# Patient Record
Sex: Female | Born: 1980 | Race: Black or African American | Hispanic: No | Marital: Single | Smoking: Former smoker
Health system: Southern US, Community
[De-identification: ages and names within clinical notes are randomized; demographics above are authoritative.]

## PROBLEM LIST (undated history)

## (undated) ENCOUNTER — Ambulatory Visit

## (undated) ENCOUNTER — Inpatient Hospital Stay (HOSPITAL_COMMUNITY): Payer: Medicaid Other

## (undated) ENCOUNTER — Inpatient Hospital Stay (HOSPITAL_COMMUNITY): Payer: Self-pay

## (undated) DIAGNOSIS — F32A Depression, unspecified: Secondary | ICD-10-CM

## (undated) DIAGNOSIS — K219 Gastro-esophageal reflux disease without esophagitis: Secondary | ICD-10-CM

## (undated) DIAGNOSIS — Z8711 Personal history of peptic ulcer disease: Secondary | ICD-10-CM

## (undated) DIAGNOSIS — F329 Major depressive disorder, single episode, unspecified: Secondary | ICD-10-CM

## (undated) DIAGNOSIS — J302 Other seasonal allergic rhinitis: Secondary | ICD-10-CM

## (undated) DIAGNOSIS — I10 Essential (primary) hypertension: Secondary | ICD-10-CM

## (undated) DIAGNOSIS — T7840XA Allergy, unspecified, initial encounter: Secondary | ICD-10-CM

## (undated) DIAGNOSIS — G43909 Migraine, unspecified, not intractable, without status migrainosus: Secondary | ICD-10-CM

## (undated) DIAGNOSIS — E669 Obesity, unspecified: Secondary | ICD-10-CM

## (undated) DIAGNOSIS — Z8719 Personal history of other diseases of the digestive system: Secondary | ICD-10-CM

## (undated) DIAGNOSIS — F419 Anxiety disorder, unspecified: Secondary | ICD-10-CM

## (undated) HISTORY — DX: Major depressive disorder, single episode, unspecified: F32.9

## (undated) HISTORY — DX: Essential (primary) hypertension: I10

## (undated) HISTORY — DX: Migraine, unspecified, not intractable, without status migrainosus: G43.909

## (undated) HISTORY — DX: Obesity, unspecified: E66.9

## (undated) HISTORY — DX: Other seasonal allergic rhinitis: J30.2

## (undated) HISTORY — DX: Allergy, unspecified, initial encounter: T78.40XA

## (undated) HISTORY — DX: Depression, unspecified: F32.A

## (undated) HISTORY — DX: Gastro-esophageal reflux disease without esophagitis: K21.9

## (undated) HISTORY — PX: UPPER GASTROINTESTINAL ENDOSCOPY: SHX188

## (undated) HISTORY — DX: Personal history of peptic ulcer disease: Z87.11

## (undated) HISTORY — PX: COLONOSCOPY: SHX174

---
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
## 2013-12-12 ENCOUNTER — Encounter: Payer: Self-pay | Admitting: Obstetrics & Gynecology

## 2013-12-12 ENCOUNTER — Ambulatory Visit: Payer: BC Managed Care – PPO | Admitting: Obstetrics & Gynecology

## 2013-12-12 ENCOUNTER — Ambulatory Visit (INDEPENDENT_AMBULATORY_CARE_PROVIDER_SITE_OTHER): Payer: BC Managed Care – PPO | Admitting: Obstetrics & Gynecology

## 2013-12-12 VITALS — BP 129/84 | HR 88 | Temp 97.8°F | Ht 66.0 in | Wt 236.0 lb

## 2013-12-12 DIAGNOSIS — Z130 Encounter for screening for diseases of the blood and blood-forming organs and certain disorders involving the immune mechanism: Secondary | ICD-10-CM

## 2013-12-12 DIAGNOSIS — Z01419 Encounter for gynecological examination (general) (routine) without abnormal findings: Secondary | ICD-10-CM

## 2013-12-12 DIAGNOSIS — Z113 Encounter for screening for infections with a predominantly sexual mode of transmission: Secondary | ICD-10-CM

## 2013-12-12 DIAGNOSIS — Z124 Encounter for screening for malignant neoplasm of cervix: Secondary | ICD-10-CM

## 2013-12-12 LAB — POCT URINALYSIS DIPSTICK
BILIRUBIN UA: NEGATIVE
GLUCOSE UA: NEGATIVE
Ketones, UA: NEGATIVE
LEUKOCYTES UA: NEGATIVE
NITRITE UA: NEGATIVE
Protein, UA: NEGATIVE
Spec Grav, UA: 1.02
Urobilinogen, UA: NEGATIVE
pH, UA: 5

## 2013-12-12 LAB — CBC
HCT: 36.8 % (ref 36.0–46.0)
Hemoglobin: 11.9 g/dL — ABNORMAL LOW (ref 12.0–15.0)
MCH: 26.6 pg (ref 26.0–34.0)
MCHC: 32.3 g/dL (ref 30.0–36.0)
MCV: 82.3 fL (ref 78.0–100.0)
Platelets: 414 10*3/uL — ABNORMAL HIGH (ref 150–400)
RBC: 4.47 MIL/uL (ref 3.87–5.11)
RDW: 13.9 % (ref 11.5–15.5)
WBC: 9.2 10*3/uL (ref 4.0–10.5)

## 2013-12-12 NOTE — Progress Notes (Signed)
Subjective:     Sheila Davis is a 33 y.o. female here for a routine exam.  Current complaints: request t be evaluated for anemia.  Personal health questionnaire reviewed: no.   Gynecologic History Patient's last menstrual period was 11/18/2013. Contraception: OCP (estrogen/progesterone) Last Pap: 08/2012. Results were: normal Last mammogram: N/A  Obstetric History OB History  Gravida Para Term Preterm AB SAB TAB Ectopic Multiple Living  2 2 2            # Outcome Date GA Lbr Len/2nd Weight Sex Delivery Anes PTL Lv  2 TRM 10/02/05 7111w0d  7 lb (3.175 kg)  CCS     1 TRM 09/01/01 4914w0d 24:00 8 lb 12 oz (3.969 kg) M CCS          The following portions of the patient's history were reviewed and updated as appropriate: allergies, current medications, past family history, past medical history, past social history, past surgical history and problem list.  Review of Systems Pertinent items are noted in HPI.   Objective:   BP 129/84  Pulse 88  Temp(Src) 97.8 F (36.6 C) (Oral)  Ht 5\' 6"  (1.676 m)  Wt 236 lb (107.049 kg)  BMI 38.11 kg/m2  LMP 11/18/2013    General appearance: alert Breasts: normal appearance, no masses or tenderness Abdomen: soft, non-tender; bowel sounds normal; no masses,  no organomegaly Pelvic: cervix normal in appearance, external genitalia normal, no adnexal masses or tenderness, uterus normal size, shape, and consistency and vagina normal without discharge    Assessment:   Healthy female exam.   Plan:   Return prn

## 2013-12-13 ENCOUNTER — Encounter: Payer: Self-pay | Admitting: Obstetrics & Gynecology

## 2013-12-13 ENCOUNTER — Telehealth: Payer: Self-pay | Admitting: Genetic Counselor

## 2013-12-13 LAB — PAP IG, CT-NG NAA, HPV HIGH-RISK
Chlamydia Probe Amp: NEGATIVE
GC Probe Amp: NEGATIVE
HPV DNA HIGH RISK: NOT DETECTED

## 2013-12-13 LAB — RPR

## 2013-12-13 LAB — HIV ANTIBODY (ROUTINE TESTING W REFLEX): HIV: NONREACTIVE

## 2013-12-13 NOTE — Patient Instructions (Signed)

## 2013-12-13 NOTE — Telephone Encounter (Signed)
LEFT MESSAGE FOR PATIENT TO RETURN CALL TO SCHEDULE GENETIC APPT.  °

## 2013-12-23 ENCOUNTER — Ambulatory Visit (INDEPENDENT_AMBULATORY_CARE_PROVIDER_SITE_OTHER): Payer: BC Managed Care – PPO | Admitting: Physician Assistant

## 2013-12-23 VITALS — BP 122/74 | HR 94 | Temp 98.2°F | Resp 17 | Ht 66.0 in | Wt 234.0 lb

## 2013-12-23 DIAGNOSIS — N898 Other specified noninflammatory disorders of vagina: Secondary | ICD-10-CM

## 2013-12-23 DIAGNOSIS — R300 Dysuria: Secondary | ICD-10-CM

## 2013-12-23 DIAGNOSIS — B373 Candidiasis of vulva and vagina: Secondary | ICD-10-CM

## 2013-12-23 DIAGNOSIS — B3731 Acute candidiasis of vulva and vagina: Secondary | ICD-10-CM

## 2013-12-23 LAB — POCT UA - MICROSCOPIC ONLY
Casts, Ur, LPF, POC: NEGATIVE
Crystals, Ur, HPF, POC: NEGATIVE

## 2013-12-23 LAB — POCT WET PREP WITH KOH
KOH Prep POC: POSITIVE
Trichomonas, UA: NEGATIVE
Yeast Wet Prep HPF POC: POSITIVE

## 2013-12-23 LAB — POCT URINALYSIS DIPSTICK
Bilirubin, UA: NEGATIVE
Glucose, UA: NEGATIVE
Ketones, UA: NEGATIVE
Nitrite, UA: POSITIVE
Protein, UA: 30
Spec Grav, UA: 1.025
Urobilinogen, UA: 0.2
pH, UA: 7

## 2013-12-23 MED ORDER — PHENAZOPYRIDINE HCL 200 MG PO TABS: 200.0000 mg | ORAL_TABLET | Freq: Three times a day (TID) | ORAL | Status: DC | PRN

## 2013-12-23 MED ORDER — FLUCONAZOLE 150 MG PO TABS: 150.0000 mg | ORAL_TABLET | Freq: Once | ORAL | Status: DC

## 2013-12-23 MED ORDER — CIPROFLOXACIN HCL 250 MG PO TABS: 250.0000 mg | ORAL_TABLET | Freq: Two times a day (BID) | ORAL | Status: DC

## 2013-12-23 MED ORDER — CLOTRIMAZOLE 2 % VA CREA: 1.0000 | TOPICAL_CREAM | Freq: Every day | VAGINAL | Status: DC

## 2013-12-23 NOTE — Progress Notes (Signed)
Subjective:    Patient ID: Sheila Davis, female    DOB: 04-07-1981, 33 y.o.   MRN: [id number]  HPI 33 year old female presents for evaluation of dysuria, vaginal discharge, and lower abdominal pain.  States symptoms have been progressively worsening x 3 days.  She had her annual exam on 3/9 that included pap and STD testing that was all normal.  Admits that this past week she did a "cleanse bath" that included lavender, essential oils, and baking soda - believes this has caused her to have her current sx's.  Admits to vaginal pruritis, irritation, and discharge. Used monistat 3 nights ago that did not help much.   Hx of ovarian cysts that are well controlled on OCP's.  Denies fever, chills, nausea, vomiting, urinary frequency, flank pain, or vaginal odor.   Patient is otherwise doing well with no other concerns today.    Review of Systems  Constitutional: Negative for fever and chills.  Gastrointestinal: Positive for abdominal pain. Negative for nausea and vomiting.  Genitourinary: Positive for dysuria and vaginal discharge. Negative for frequency, flank pain and vaginal bleeding.       Objective:   Physical Exam  Constitutional: She is oriented to person, place, and time. She appears well-developed and well-nourished.  HENT:  Head: Normocephalic and atraumatic.  Right Ear: External ear normal.  Left Ear: External ear normal.  Eyes: Conjunctivae are normal.  Neck: Normal range of motion.  Cardiovascular: Normal rate, regular rhythm and normal heart sounds.   Pulmonary/Chest: Effort normal and breath sounds normal.  Neurological: She is alert and oriented to person, place, and time.  Psychiatric: She has a normal mood and affect. Her behavior is normal. Judgment and thought content normal.      Results for orders placed in visit on 12/23/13  POCT URINALYSIS DIPSTICK      Result Value Ref Range   Color, UA ORANGE     Clarity, UA CLOUDY     Glucose, UA NEG     Bilirubin, UA  NEG     Ketones, UA NEG     Spec Grav, UA 1.025     Blood, UA TRACE-INTACT     pH, UA 7.0     Protein, UA 30     Urobilinogen, UA 0.2     Nitrite, UA POS     Leukocytes, UA small (1+)    POCT UA - MICROSCOPIC ONLY      Result Value Ref Range   WBC, Ur, HPF, POC 12-23     RBC, urine, microscopic 6-14     Bacteria, U Microscopic 1+     Mucus, UA MOD     Epithelial cells, urine per micros 1-5     Crystals, Ur, HPF, POC NEG     Casts, Ur, LPF, POC NEG     Yeast, UA BUDS    POCT WET PREP WITH KOH      Result Value Ref Range   Trichomonas, UA Negative     Clue Cells Wet Prep HPF POC 0-1     Epithelial Wet Prep HPF POC 6-21     Yeast Wet Prep HPF POC pos     Bacteria Wet Prep HPF POC 3+     RBC Wet Prep HPF POC 2-13     WBC Wet Prep HPF POC tntc     KOH Prep POC Positive          Assessment & Plan:  Dysuria - Plan: POCT urinalysis dipstick, POCT  UA - Microscopic Only, ciprofloxacin (CIPRO) 250 MG tablet, phenazopyridine (PYRIDIUM) 200 MG tablet  Leukorrhea, not specified as infective - Plan: POCT Wet Prep with KOH  Yeast vaginitis - Plan: fluconazole (DIFLUCAN) 150 MG tablet, clotrimazole (GYNE-LOTRIMIN 3) 2 % vaginal cream  Will treat for yeast vaginitis with diflucan 150 mg po x 1 dose today. Repeat in 7 days Gyne-lotrimin rx provided to use as directed for topical relief Suspect UTI is causing abdominal pain and dysuria. Will treat with Cipro 250 mg bid x 5 days. Urine culture sent Follow up if symptoms worsening or fail to improve.

## 2014-01-06 ENCOUNTER — Encounter (HOSPITAL_COMMUNITY): Payer: Self-pay | Admitting: Emergency Medicine

## 2014-01-06 ENCOUNTER — Emergency Department (HOSPITAL_COMMUNITY)
Admission: EM | Admit: 2014-01-06 | Discharge: 2014-01-06 | Disposition: A | Discharge status: Home / Self Care | Payer: BC Managed Care – PPO | Source: Home / Self Care | Attending: Emergency Medicine | Admitting: Emergency Medicine

## 2014-01-06 DIAGNOSIS — N760 Acute vaginitis: Secondary | ICD-10-CM

## 2014-01-06 LAB — POCT URINALYSIS DIP (DEVICE)
Bilirubin Urine: NEGATIVE
Glucose, UA: NEGATIVE mg/dL
Ketones, ur: NEGATIVE mg/dL
Nitrite: NEGATIVE
Protein, ur: 30 mg/dL — AB
Specific Gravity, Urine: 1.02 (ref 1.005–1.030)
UROBILINOGEN UA: 0.2 mg/dL (ref 0.0–1.0)
pH: 7.5 (ref 5.0–8.0)

## 2014-01-06 LAB — POCT PREGNANCY, URINE: Preg Test, Ur: NEGATIVE

## 2014-01-06 MED ORDER — FLUCONAZOLE 150 MG PO TABS: 150.0000 mg | ORAL_TABLET | Freq: Every day | ORAL | Status: DC

## 2014-01-06 NOTE — Discharge Instructions (Signed)
Ms. Sheila Davis,   I am sorry for your troubles. You may have a persistent yeast infection from the antibiotics that you finished last week. Please take the diflucan tablet tonight and repeat in 3 days if necessary. Also, there is not better prescription cream for this issue.   Follow up with your OB-GYN if this is not improved.   Take Care,   Dr. Clinton SawyerWilliamson

## 2014-01-06 NOTE — ED Provider Notes (Signed)
CSN: [id number]     Arrival date & time 01/06/14  1937 History   First MD Initiated Contact with Patient 01/06/14 2003     Chief Complaint  Patient presents with  . Vaginal Itching   (Consider location/radiation/quality/duration/timing/severity/associated sxs/prior Treatment) HPI  33 year old F presenting with complaints of vaginal itching. This has been persistent for about 2 weeks. She was seen a few weeks ago by her OB/GYN who tested her for STDs. These tests were reported to be negative. [facility] At that time she was determined to have clinical symptoms of a urinary tract infection. Therefore she was treated with ciprofloxacin for 5 days. On the last day she took a Diflucan tablet to help with vaginal itching. However, she does have persistent itching since that time. She has tried lotrimin cream for 7 days. She denies any vaginal discharge. She denies any pelvic pain, fever, chills, nausea or vomiting. She denies any vaginal bleeding.   Past Medical History  Diagnosis Date  . Depression     pcp  . Seasonal allergies    Past Surgical History  Procedure Laterality Date  . Cesarean section       2002 & 2006   Family History  Problem Relation Age of Onset  . Cancer - Ovarian Mother 4163  . Diabetes Paternal Grandmother   . Heart disease Mother   . Hypertension Father    History  Substance Use Topics  . Smoking status: Never Smoker   . Smokeless tobacco: Never Used  . Alcohol Use: Yes   OB History   Grav Para Term Preterm Abortions TAB SAB Ect Mult Living   3 2 2  1  1         Review of Systems Negative for vaginal discharge, vaginal bleeding, abdominal pain, flank pain, fever, chills Allergies  Review of patient's allergies indicates no known allergies.  Home Medications   Current Outpatient Rx  Name  Route  Sig  Dispense  Refill  . ciprofloxacin (CIPRO) 250 MG tablet   Oral   Take 1 tablet (250 mg total) by mouth 2  (two) times daily.   10 tablet   0   . clotrimazole (GYNE-LOTRIMIN 3) 2 % vaginal cream   Vaginal   Place 1 Applicatorful vaginally at bedtime.   21 g   0   . fluconazole (DIFLUCAN) 150 MG tablet   Oral   Take 1 tablet (150 mg total) by mouth once. Repeat if needed   2 tablet   0   . Norgestim-Eth Estrad Triphasic (TRI-SPRINTEC PO)   Oral   Take 1 tablet by mouth daily.         . fluconazole (DIFLUCAN) 150 MG tablet   Oral   Take 1 tablet (150 mg total) by mouth daily. Repeat in 3 days as necessary.   2 tablet   0   . phenazopyridine (PYRIDIUM) 200 MG tablet   Oral   Take 1 tablet (200 mg total) by mouth 3 (three) times daily as needed for pain.   10 tablet   0    BP 120/82  Pulse 92  Temp(Src) 98.9 F (37.2 C) (Oral)  Resp 16  SpO2 97%  LMP 12/17/2013 Physical Exam Gen: obese female, non ill appearing, pleasant CV: RRR, no murmurs Pulm: CTA-B Abd: soft, NDNT GYN exam: deferred given no discharge  ED Course  Procedures (including critical care time) Labs Review Labs Reviewed  POCT URINALYSIS DIP (DEVICE) - Abnormal; Notable for the following:    Hgb urine dipstick TRACE (*)    Protein, ur 30 (*)    Leukocytes, UA LARGE (*)    All other components within normal limits  POCT PREGNANCY, URINE   Imaging Review No results found.   MDM   1. Vaginitis    Pt with persistent itching since taking antibiotic. Pt will need treatment with diflucan x 2.    Garnetta Buddy, MD 01/06/14 2106

## 2014-01-06 NOTE — ED Provider Notes (Signed)
Medical screening examination/treatment/procedure(s) were performed by a resident physician and as supervising physician I was immediately available for consultation/collaboration.  Leslee Homeavid Deondre Marinaro, M.D.  Reuben Likesavid C Paxton Binns, MD 01/06/14 2138

## 2014-01-06 NOTE — ED Notes (Signed)
Reports urine has an odor, has vaginal itching.  Patient reports recently completed course of cipro-completed on (3/29).  Then she thought to have yeast infection, tried otc remedies and one diflucan.

## 2014-01-09 ENCOUNTER — Ambulatory Visit (INDEPENDENT_AMBULATORY_CARE_PROVIDER_SITE_OTHER): Payer: BC Managed Care – PPO | Admitting: Emergency Medicine

## 2014-01-09 VITALS — BP 108/84 | HR 112 | Temp 98.8°F | Resp 18 | Ht 66.0 in | Wt 223.4 lb

## 2014-01-09 DIAGNOSIS — J018 Other acute sinusitis: Secondary | ICD-10-CM

## 2014-01-09 DIAGNOSIS — J209 Acute bronchitis, unspecified: Secondary | ICD-10-CM

## 2014-01-09 MED ORDER — FLUCONAZOLE 150 MG PO TABS: 150.0000 mg | ORAL_TABLET | Freq: Once | ORAL | Status: DC

## 2014-01-09 MED ORDER — PROMETHAZINE-CODEINE 6.25-10 MG/5ML PO SYRP: 5.0000 mL | ORAL_SOLUTION | Freq: Four times a day (QID) | ORAL | Status: DC | PRN

## 2014-01-09 MED ORDER — LEVOFLOXACIN 500 MG PO TABS
500.0000 mg | ORAL_TABLET | Freq: Every day | ORAL | Status: DC
Start: 2014-01-09 — End: 2014-01-19

## 2014-01-09 MED ORDER — PSEUDOEPHEDRINE-GUAIFENESIN ER 60-600 MG PO TB12: 1.0000 | ORAL_TABLET | Freq: Two times a day (BID) | ORAL | Status: DC

## 2014-01-09 NOTE — Patient Instructions (Signed)

## 2014-01-09 NOTE — Progress Notes (Signed)
[facility] [address], [place] [address] [phone]  Date:  01/09/2014   Name:  Sheila Davis   DOB:  June 19, 1981   MRN:  [id number]  PCP:  No primary provider on file.    Chief Complaint: Generalized Body Aches, Cough, Sinusitis, Otalgia and Chills   History of Present Illness:  Sheila Davis is a 33 y.o. very pleasant female patient who presents with the following:  Ill since Friday with nasal congestion and drainage and post nasal drainage.  Purulent.  Has a sore throat and pain and fullness in both ears.  Has a cough productive of purulent sputum.  No wheezing or shortness of breath.  Has experienced a fever with no chills.  No improvement with over the counter medications or other home remedies. Denies other complaint or health concern today.   There are no active problems to display for this patient.   Past Medical History  Diagnosis Date  . Depression     pcp  . Seasonal allergies     Past Surgical History  Procedure Laterality Date  . Cesarean section       2002 & 2006    History  Substance Use Topics  . Smoking status: Never Smoker   . Smokeless tobacco: Never Used  . Alcohol Use: Yes    Family History  Problem Relation Age of Onset  . Cancer - Ovarian Mother 6663  . Diabetes Paternal Grandmother   . Heart disease Mother   . Hypertension Father     No Known Allergies  Medication list has been reviewed and updated.  Current Outpatient Prescriptions on File Prior to Visit  Medication Sig Dispense Refill  . Norgestim-Eth Estrad Triphasic (TRI-SPRINTEC PO) Take 1 tablet by mouth daily.       No current facility-administered medications on file prior to visit.    Review of Systems:  As per HPI, otherwise negative.    Physical Examination: Filed Vitals:   01/09/14 1426  BP: 108/84  Pulse: 112  Temp: 98.8 F (37.1 C)  Resp: 18   Filed Vitals:   01/09/14 1426  Height: 5\' 6"  (1.676 m)  Weight: 223 lb 6.4 oz  (101.334 kg)   Body mass index is 36.08 kg/(m^2). Ideal Body Weight: Weight in (lb) to have BMI = 25: 154.6  GEN: WDWN, NAD, Non-toxic, A & O x 3 HEENT: Atraumatic, Normocephalic. Neck supple. No masses, No LAD. Ears and Nose: No external deformity. CV: RRR, No M/G/R. No JVD. No thrill. No extra heart sounds. PULM: CTA B, no wheezes, crackles, rhonchi. No retractions. No resp. distress. No accessory muscle use. ABD: S, NT, ND, +BS. No rebound. No HSM. EXTR: No c/c/e NEURO Normal gait.  PSYCH: Normally interactive. Conversant. Not depressed or anxious appearing.  Calm demeanor.    Assessment and Plan: Sinusitis Bronchitis levaquin mucinex d Phen c cod Diflucan   Signed,  Phillips OdorJeffery Tatianna Ibbotson, MD

## 2014-01-19 ENCOUNTER — Encounter: Payer: Self-pay | Admitting: Obstetrics & Gynecology

## 2014-01-19 ENCOUNTER — Ambulatory Visit (INDEPENDENT_AMBULATORY_CARE_PROVIDER_SITE_OTHER): Payer: BC Managed Care – PPO | Admitting: Obstetrics & Gynecology

## 2014-01-19 VITALS — BP 105/72 | HR 70 | Temp 98.6°F | Ht 66.0 in | Wt 236.0 lb

## 2014-01-19 DIAGNOSIS — N760 Acute vaginitis: Secondary | ICD-10-CM

## 2014-01-19 NOTE — Progress Notes (Signed)
Subjective:     Sheila Davis is a 33 y.o. female c/o a vaginal discharge. Patient states she is having itching, irritation, burning, and a thick/watery white vaginal discharge. Patient denies any vaginal odor. Patient states any time she tries to wash the irritation is horriable. Patient states she only takes showers, only use dove soap, only wears cotton underware and doesn't wear underware at night. Patient states she used Dial soap once about 2-3 weeks ago and noticed the irritation and vaginal discharge after that. Patient states she has tried  Gynelotrimin cream and monastat but neither made it any better. Patient states she took the Difulcan around the 6th or 7th and that kind of helped but that it came back. Patient states she is currently on her cycle and that it is heavy but not extremely heavy.  The HPI was reviewed and explored in further detail by the provider. Gynecologic History Patient's last menstrual period was 01/17/2014. Contraception: OCP (estrogen/progesterone) Last Pap: 12/12/2013. Results were: normal  Obstetric History OB History  Gravida Para Term Preterm AB SAB TAB Ectopic Multiple Living  3 2 2  1 1         # Outcome Date GA Lbr Len/2nd Weight Sex Delivery Anes PTL Lv  3 SAB 06/03/13 530w0d         2 TRM 10/02/05 21330w0d  7 lb (3.175 kg)  CCS     1 TRM 09/01/01 7418w0d 24:00 8 lb 12 oz (3.969 kg) M CCS         Past Medical History  Diagnosis Date  . Depression     pcp  . Seasonal allergies     Past Surgical History  Procedure Laterality Date  . Cesarean section       2002 & 2006    Current Outpatient Prescriptions  Medication Sig Dispense Refill  . Norgestim-Eth Estrad Triphasic (TRI-SPRINTEC PO) Take 1 tablet by mouth daily.       No current facility-administered medications for this visit.    No Known Allergies  History  Substance Use Topics  . Smoking status: Never Smoker   . Smokeless tobacco: Never Used  . Alcohol Use: Yes     Comment: occasionally     Family History  Problem Relation Age of Onset  . Cancer - Ovarian Mother 4863  . Diabetes Paternal Grandmother   . Heart disease Mother   . Hypertension Father       Review of Systems  Constitutional: negative for fatigue and weight loss Respiratory: negative for cough and wheezing Cardiovascular: negative for chest pain, fatigue and palpitations Gastrointestinal: negative for abdominal pain and change in bowel habits Musculoskeletal:negative for myalgias Neurological: negative for gait problems and tremors Behavioral/Psych: negative for abusive relationship, depression Endocrine: negative for temperature intolerance   Genitourinary:negative for abnormal menstrual periods, genital lesions, hot flashes, sexual problems; positive for  vaginal discharge Integument/breast: negative for breast lump, breast tenderness, nipple discharge and skin lesion(s)    Objective:       General:   alert  Skin:   no rash or abnormalities  Lungs:   clear to auscultation bilaterally  Heart:   regular rate and rhythm, S1, S2 normal, no murmur, click, rub or gallop  Breasts:   normal without suspicious masses, skin or nipple changes or axillary nodes  Abdomen:  normal findings: no organomegaly, soft, non-tender and no hernia  Pelvis:  External genitalia: normal general appearance Urinary system: urethral meatus normal and bladder without fullness, nontender Vaginal: normal without tenderness,  induration or masses; heme in the vault present Cervix: normal appearance Adnexa: normal bimanual exam Uterus: anteverted and non-tender, normal size   Lab Review  Labs reviewed no Radiologic studies reviewed no    Assessment:   Vulvovaginitis--?etiology; ddx--resistant candida, bacterial vaginosis, atopy  Plan:    Avoid irritants Orders Placed This Encounter  Procedures  . WET PREP BY MOLECULAR PROBE  Will prescribe further treatment based on wet prep resuts Follow up as needed.

## 2014-01-20 LAB — WET PREP BY MOLECULAR PROBE
Candida species: NEGATIVE
Gardnerella vaginalis: NEGATIVE
Trichomonas vaginosis: POSITIVE — AB

## 2014-01-22 ENCOUNTER — Encounter: Payer: Self-pay | Admitting: Obstetrics & Gynecology

## 2014-01-22 DIAGNOSIS — A5901 Trichomonal vulvovaginitis: Secondary | ICD-10-CM | POA: Insufficient documentation

## 2014-01-22 NOTE — Patient Instructions (Signed)
Vaginitis Vaginitis is an inflammation of the vagina. It is most often caused by a change in the normal balance of the bacteria and yeast that live in the vagina. This change in balance causes an overgrowth of certain bacteria or yeast, which causes the inflammation. There are different types of vaginitis, but the most common types are:  Bacterial vaginosis.  Yeast infection (candidiasis).  Trichomoniasis vaginitis. This is a sexually transmitted infection (STI).  Viral vaginitis.  Atropic vaginitis.  Allergic vaginitis. CAUSES  The cause depends on the type of vaginitis. Vaginitis can be caused by:  Bacteria (bacterial vaginosis).  Yeast (yeast infection).  A parasite (trichomoniasis vaginitis)  A virus (viral vaginitis).  Low hormone levels (atrophic vaginitis). Low hormone levels can occur during pregnancy, breastfeeding, or after menopause.  Irritants, such as bubble baths, scented tampons, and feminine sprays (allergic vaginitis). Other factors can change the normal balance of the yeast and bacteria that live in the vagina. These include:  Antibiotic medicines.  Poor hygiene.  Diaphragms, vaginal sponges, spermicides, birth control pills, and intrauterine devices (IUD).  Sexual intercourse.  Infection.  Uncontrolled diabetes.  A weakened immune system. SYMPTOMS  Symptoms can vary depending on the cause of the vaginitis. Common symptoms include:  Abnormal vaginal discharge.  The discharge is white, gray, or yellow with bacterial vaginosis.  The discharge is thick, white, and cheesy with a yeast infection.  The discharge is frothy and yellow or greenish with trichomoniasis.  A bad vaginal odor.  The odor is fishy with bacterial vaginosis.  Vaginal itching, pain, or swelling.  Painful intercourse.  Pain or burning when urinating. Sometimes, there are no symptoms. TREATMENT  Treatment will vary depending on the type of infection.   Bacterial  vaginosis and trichomoniasis are often treated with antibiotic creams or pills.  Yeast infections are often treated with antifungal medicines, such as vaginal creams or suppositories.  Viral vaginitis has no cure, but symptoms can be treated with medicines that relieve discomfort. Your sexual partner should be treated as well.  Atrophic vaginitis may be treated with an estrogen cream, pill, suppository, or vaginal ring. If vaginal dryness occurs, lubricants and moisturizing creams may help. You may be told to avoid scented soaps, sprays, or douches.  Allergic vaginitis treatment involves quitting the use of the product that is causing the problem. Vaginal creams can be used to treat the symptoms. HOME CARE INSTRUCTIONS   Take all medicines as directed by your caregiver.  Keep your genital area clean and dry. Avoid soap and only rinse the area with water.  Avoid douching. It can remove the healthy bacteria in the vagina.  Do not use tampons or have sexual intercourse until your vaginitis has been treated. Use sanitary pads while you have vaginitis.  Wipe from front to back. This avoids the spread of bacteria from the rectum to the vagina.  Let air reach your genital area.  Wear cotton underwear to decrease moisture buildup.  Avoid wearing underwear while you sleep until your vaginitis is gone.  Avoid tight pants and underwear or nylons without a cotton panel.  Take off wet clothing (especially bathing suits) as soon as possible.  Use mild, non-scented products. Avoid using irritants, such as:  Scented feminine sprays.  Fabric softeners.  Scented detergents.  Scented tampons.  Scented soaps or bubble baths.  Practice safe sex and use condoms. Condoms may prevent the spread of trichomoniasis and viral vaginitis. SEEK MEDICAL CARE IF:   You have abdominal pain.  You   have a fever or persistent symptoms for more than 2 3 days.  You have a fever and your symptoms suddenly  get worse. Document Released: 07/20/2007 Document Revised: 06/16/2012 Document Reviewed: 03/04/2012 [employer] Patient Information 2014 [employer].  

## 2014-01-23 ENCOUNTER — Other Ambulatory Visit: Payer: Self-pay | Admitting: *Deleted

## 2014-01-23 DIAGNOSIS — A599 Trichomoniasis, unspecified: Secondary | ICD-10-CM

## 2014-01-23 MED ORDER — METRONIDAZOLE 500 MG PO TABS: 2000.0000 mg | ORAL_TABLET | Freq: Once | ORAL | Status: DC

## 2014-03-23 ENCOUNTER — Ambulatory Visit: Payer: BC Managed Care – PPO | Admitting: Obstetrics & Gynecology

## 2014-08-07 ENCOUNTER — Encounter: Payer: Self-pay | Admitting: Obstetrics & Gynecology

## 2014-10-02 ENCOUNTER — Encounter: Payer: Self-pay | Admitting: *Deleted

## 2014-10-03 ENCOUNTER — Encounter: Payer: Self-pay | Admitting: Obstetrics & Gynecology

## 2014-11-10 ENCOUNTER — Ambulatory Visit (INDEPENDENT_AMBULATORY_CARE_PROVIDER_SITE_OTHER): Payer: No Typology Code available for payment source | Admitting: Family Medicine

## 2014-11-10 VITALS — BP 120/80 | HR 85 | Temp 98.5°F | Resp 16 | Ht 66.0 in | Wt 245.0 lb

## 2014-11-10 DIAGNOSIS — N912 Amenorrhea, unspecified: Secondary | ICD-10-CM

## 2014-11-10 DIAGNOSIS — N898 Other specified noninflammatory disorders of vagina: Secondary | ICD-10-CM

## 2014-11-10 DIAGNOSIS — R1030 Lower abdominal pain, unspecified: Secondary | ICD-10-CM

## 2014-11-10 LAB — POCT WET PREP WITH KOH
Bacteria Wet Prep HPF POC: NEGATIVE
Clue Cells Wet Prep HPF POC: NEGATIVE
KOH Prep POC: NEGATIVE
Trichomonas, UA: NEGATIVE
WBC Wet Prep HPF POC: NEGATIVE
Yeast Wet Prep HPF POC: NEGATIVE

## 2014-11-10 LAB — POCT URINALYSIS DIPSTICK
Bilirubin, UA: NEGATIVE
Glucose, UA: NEGATIVE
Ketones, UA: NEGATIVE
Leukocytes, UA: NEGATIVE
Nitrite, UA: NEGATIVE
Protein, UA: NEGATIVE
Spec Grav, UA: 1.01
Urobilinogen, UA: 0.2
pH, UA: 5.5

## 2014-11-10 LAB — POCT URINE PREGNANCY: Preg Test, Ur: NEGATIVE

## 2014-11-10 LAB — POCT UA - MICROSCOPIC ONLY
Bacteria, U Microscopic: NEGATIVE
Mucus, UA: NEGATIVE
WBC, Ur, HPF, POC: NEGATIVE

## 2014-11-10 MED ORDER — METRONIDAZOLE 250 MG PO TABS: 250.0000 mg | ORAL_TABLET | Freq: Three times a day (TID) | ORAL | Status: DC

## 2014-11-10 NOTE — Patient Instructions (Signed)

## 2014-11-10 NOTE — Progress Notes (Addendum)
Subjective:    Patient ID: Sheila Davis, female    DOB: Aug 02, 1981, 34 y.o.   MRN: [id number] This chart was scribed for Elvina SidleKurt Lauenstein, MD by Littie Deedsichard Sun, Medical Scribe. This patient was seen in Room 10 and the patient's care was started at 4:09 PM.   HPI HPI Comments: Sheila Davis is a 34 y.o. female who [facility] complaining of gradual onset, pressurizing lower abdominal pain radiating to her back that started a few days ago. Patient reports having associated HA, nausea, and clear vaginal discharge with reddish tinge that she believes to be blood.  She denies fever, chest pain, cough and vomiting. Patient is on Tri-Sprintec; however, she has not refilled them on time. LNMP was 10/07/14; her menstrual cycle has run late.  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  Respiratory: Negative for cough.   Cardiovascular: Negative for chest pain.  Gastrointestinal: Positive for nausea and abdominal pain. Negative for vomiting.  Genitourinary: Positive for vaginal discharge.  Musculoskeletal: Positive for back pain.  Neurological: Positive for headaches.   Review of chart reveals patient has similar complaints every several months for which she is prescribed narcotics    Objective:   Physical Exam CONSTITUTIONAL: Obese African-American woman in no acute distress HEAD: Normocephalic/atraumatic EYES: EOM/PERRL ENMT: Mucous membranes moist NECK: supple no meningeal signs SPINE: entire spine nontender CV: S1/S2 noted, no murmurs/rubs/gallops noted LUNGS: Lungs are clear to auscultation bilaterally, no apparent distress ABDOMEN: soft, nontender, no rebound or guarding GU: normal ext genitalia, moderate white vag discharge, no pelvic pain or mass, no adenexal mass or tenderness. NEURO: Pt is awake/alert, moves all extremitiesx4,  EXTREMITIES: pulses  normal, full ROM SKIN: warm and dry PSYCH: no abnormalities of mood noted Results for orders placed or performed in visit on 11/10/14  POCT UA - Microscopic Only  Result Value Ref Range   WBC, Ur, HPF, POC Negative    RBC, urine, microscopic 0-3    Bacteria, U Microscopic Negative    Mucus, UA Negative    Epithelial cells, urine per micros 0-7    Crystals, Ur, HPF, POC     Casts, Ur, LPF, POC     Yeast, UA    POCT urinalysis dipstick  Result Value Ref Range   Color, UA Yellow    Clarity, UA Slightly Cloudy    Glucose, UA Negative    Bilirubin, UA Negative    Ketones, UA Negative    Spec Grav, UA 1.010    Blood, UA Trace-lysed    pH, UA 5.5    Protein, UA Negative    Urobilinogen, UA 0.2    Nitrite, UA Negative    Leukocytes, UA Negative   POCT Wet Prep with KOH  Result Value Ref Range   Trichomonas, UA Negative    Clue Cells Wet Prep HPF POC Negative    Epithelial Wet Prep HPF POC 2-5    Yeast Wet Prep HPF POC Negative    Bacteria Wet Prep HPF POC Negative    RBC Wet Prep HPF POC 0-4    WBC Wet Prep HPF POC Negative    KOH Prep POC Negative   POCT urine pregnancy  Result Value Ref Range   Preg Test, Ur Negative       Assessment & Plan:   This chart was scribed in my presence and reviewed by  me personally.    ICD-9-CM ICD-10-CM   1. Lower abdominal pain 789.09 R10.30 POCT UA - Microscopic Only     POCT urinalysis dipstick     POCT Wet Prep with KOH     Urine culture     POCT urine pregnancy     GC/chlamydia probe amp, urine     metroNIDAZOLE (FLAGYL) 250 MG tablet  2. Vaginal discharge 623.5 N89.8 POCT Wet Prep with KOH     GC/chlamydia probe amp, urine     metroNIDAZOLE (FLAGYL) 250 MG tablet  3. Amenorrhea 626.0 N91.2 POCT urine pregnancy     metroNIDAZOLE (FLAGYL) 250 MG tablet   Note for work written  Signed, Elvina Sidle, MD

## 2014-11-11 LAB — GC/CHLAMYDIA PROBE AMP, URINE
Chlamydia, Swab/Urine, PCR: NEGATIVE
GC Probe Amp, Urine: NEGATIVE

## 2014-11-12 LAB — URINE CULTURE: Colony Count: 50000

## 2014-12-03 ENCOUNTER — Ambulatory Visit (INDEPENDENT_AMBULATORY_CARE_PROVIDER_SITE_OTHER): Payer: No Typology Code available for payment source | Admitting: Family Medicine

## 2014-12-03 VITALS — BP 122/80 | HR 88 | Temp 98.3°F | Resp 18 | Ht 67.5 in | Wt 244.2 lb

## 2014-12-03 DIAGNOSIS — R350 Frequency of micturition: Secondary | ICD-10-CM

## 2014-12-03 LAB — POCT UA - MICROSCOPIC ONLY
BACTERIA, U MICROSCOPIC: NEGATIVE
CASTS, UR, LPF, POC: NEGATIVE
CRYSTALS, UR, HPF, POC: NEGATIVE
Yeast, UA: NEGATIVE

## 2014-12-03 LAB — POCT URINALYSIS DIPSTICK
Bilirubin, UA: NEGATIVE
Glucose, UA: NEGATIVE
KETONES UA: NEGATIVE
NITRITE UA: NEGATIVE
Spec Grav, UA: 1.02
Urobilinogen, UA: 0.2
pH, UA: 6.5

## 2014-12-03 MED ORDER — CIPROFLOXACIN HCL 500 MG PO TABS: 500.0000 mg | ORAL_TABLET | Freq: Two times a day (BID) | ORAL | Status: DC

## 2014-12-03 NOTE — Progress Notes (Signed)
Subjective:    Patient ID: Sheila Davis, female    DOB: 1981-03-14, 34 y.o.   MRN: [id number]  HPI Chief Complaint  Patient presents with   Urinary Frequency    x3 days; and getting worse   urinary pain    denies having any blood in urine   Nausea   This chart was scribed for Elvina SidleKurt Lauenstein, MD by Andrew Auaven Small, ED Scribe. This patient was seen in room 8 and the patient's care was started at 4:30 PM.  HPI Comments: Sheila Davis is a 34 y.o. female who presents to the [facility] complaining of UTI that began 1 week ago. Pt symptoms consist of urinary bladder pressure, frequency,back pain, HA, and nausea. She states she had symptoms 1 weeks ago that gradually worsened 2-3 days ago. Pt has tried drinking cranberry juice and water.Pt reports hx of UTI.   Pt denies hematuria and fever. Pt denies drug allergies. Pt is on birth control Pt is a Engineer, civil (consulting)nurse at [employer].   Past Medical History  Diagnosis Date   Depression     pcp   Seasonal allergies    Past Surgical History  Procedure Laterality Date   Cesarean section       2002 & 2006   Prior to Admission medications   Medication Sig Start Date End Date Taking? Authorizing Provider  Norgestimate-Ethinyl Estradiol Triphasic 0.18/0.215/0.25 MG-35 MCG tablet Take 1 tablet by mouth daily.   Yes Historical Provider, MD   Review of Systems  Constitutional: Negative for fever.  Gastrointestinal: Positive for nausea.  Genitourinary: Positive for frequency. Negative for dysuria and hematuria.  Musculoskeletal: Positive for back pain.  Neurological: Positive for headaches.   Objective:   Physical Exam  Constitutional: She is oriented to person, place, and time. She appears well-developed and well-nourished. No distress.  HENT:  Head: Normocephalic and atraumatic.  Eyes: Conjunctivae and EOM are normal.  Neck: Neck supple.  Cardiovascular: Normal rate.   Pulmonary/Chest: Effort normal.    Musculoskeletal: Normal range of motion.  Neurological: She is alert and oriented to person, place, and time.  Skin: Skin is warm and dry.  Psychiatric: She has a normal mood and affect. Her behavior is normal.  Nursing note and vitals reviewed.  Results for orders placed or performed in visit on 12/03/14  POCT urinalysis dipstick  Result Value Ref Range   Color, UA YELLOW    Clarity, UA CLEAR    Glucose, UA NEG    Bilirubin, UA NEG    Ketones, UA NEG    Spec Grav, UA 1.020    Blood, UA SMALL    pH, UA 6.5    Protein, UA TRACE    Urobilinogen, UA 0.2    Nitrite, UA NEG    Leukocytes, UA Trace    Assessment & Plan:   1. Urinary frequency    Meds ordered this encounter  Medications   Norgestimate-Ethinyl Estradiol Triphasic 0.18/0.215/0.25 MG-35 MCG tablet    Sig: Take 1 tablet by mouth daily.   ciprofloxacin (CIPRO) 500 MG tablet    Sig: Take 1 tablet (500 mg total) by mouth 2 (two) times daily.    Dispense:  10 tablet    Refill:  0   This chart was scribed in my presence and reviewed by me personally.    ICD-9-CM ICD-10-CM   1. Urinary frequency 788.41 R35.0 POCT UA - Microscopic Only     POCT urinalysis dipstick  Urine culture     ciprofloxacin (CIPRO) 500 MG tablet     Signed, Elvina Sidle, MD

## 2014-12-03 NOTE — Patient Instructions (Signed)

## 2014-12-05 LAB — URINE CULTURE
Colony Count: NO GROWTH
Organism ID, Bacteria: NO GROWTH

## 2014-12-15 ENCOUNTER — Encounter: Payer: Self-pay | Admitting: Certified Nurse Midwife

## 2014-12-15 ENCOUNTER — Ambulatory Visit (INDEPENDENT_AMBULATORY_CARE_PROVIDER_SITE_OTHER): Payer: No Typology Code available for payment source | Admitting: Certified Nurse Midwife

## 2014-12-15 VITALS — BP 126/81 | HR 91 | Ht 66.0 in | Wt 245.0 lb

## 2014-12-15 DIAGNOSIS — R5383 Other fatigue: Secondary | ICD-10-CM

## 2014-12-15 DIAGNOSIS — R399 Unspecified symptoms and signs involving the genitourinary system: Secondary | ICD-10-CM

## 2014-12-15 DIAGNOSIS — F329 Major depressive disorder, single episode, unspecified: Secondary | ICD-10-CM

## 2014-12-15 DIAGNOSIS — Z01419 Encounter for gynecological examination (general) (routine) without abnormal findings: Secondary | ICD-10-CM

## 2014-12-15 DIAGNOSIS — F32A Depression, unspecified: Secondary | ICD-10-CM

## 2014-12-15 DIAGNOSIS — E669 Obesity, unspecified: Secondary | ICD-10-CM

## 2014-12-15 LAB — CBC WITH DIFFERENTIAL/PLATELET
Basophils Absolute: 0 10*3/uL (ref 0.0–0.1)
Basophils Relative: 0 % (ref 0–1)
Eosinophils Absolute: 0.1 10*3/uL (ref 0.0–0.7)
Eosinophils Relative: 1 % (ref 0–5)
HEMATOCRIT: 38.9 % (ref 36.0–46.0)
HEMOGLOBIN: 12.4 g/dL (ref 12.0–15.0)
LYMPHS PCT: 22 % (ref 12–46)
Lymphs Abs: 2.3 10*3/uL (ref 0.7–4.0)
MCH: 26.7 pg (ref 26.0–34.0)
MCHC: 31.9 g/dL (ref 30.0–36.0)
MCV: 83.8 fL (ref 78.0–100.0)
MONOS PCT: 6 % (ref 3–12)
MPV: 9.7 fL (ref 8.6–12.4)
Monocytes Absolute: 0.6 10*3/uL (ref 0.1–1.0)
NEUTROS ABS: 7.3 10*3/uL (ref 1.7–7.7)
NEUTROS PCT: 71 % (ref 43–77)
Platelets: 429 10*3/uL — ABNORMAL HIGH (ref 150–400)
RBC: 4.64 MIL/uL (ref 3.87–5.11)
RDW: 13.4 % (ref 11.5–15.5)
WBC: 10.3 10*3/uL (ref 4.0–10.5)

## 2014-12-15 LAB — TSH: TSH: 0.622 u[IU]/mL (ref 0.350–4.500)

## 2014-12-15 NOTE — Progress Notes (Signed)
Patient ID: Sheila Davis, female   DOB: 1981/07/03, 34 y.o.   MRN: [id number]   Subjective:     Sheila Davis is a 34 y.o. female here for a routine exam.  Current complaints: depression/anxiety along with roughly a 75 lb weight gain in a year.  Rarely sexually active.  Desires to stay on current birth control.  Desires referral for PCP, Genetics, psychiatry for medication management, and nutrition.  Employed full time.  Single parent of two children.  Has a high risk family hx of lung CA & ovarian CA of mother around late 80's.    Personal health questionnaire:  Is patient [name], have a family history of breast and/or ovarian cancer: yes Is there a family history of uterine cancer diagnosed at age < 95, gastrointestinal cancer, urinary tract cancer, family member who is a Personnel officer syndrome-associated carrier: no Is the patient overweight and hypertensive, family history of diabetes, personal history of gestational diabetes, preeclampsia or PCOS: yes Is patient over 78, have PCOS,  family history of premature CHD under age 27, diabetes, smoke, have hypertension or peripheral artery disease:  no At any time, has a partner hit, kicked or otherwise hurt or frightened you?: no Over the past 2 weeks, have you felt down, depressed or hopeless?:  yes Over the past 2 weeks, have you felt little interest or pleasure in doing things?:yes   Gynecologic History Patient's last menstrual period was 11/22/2014. Contraception: OCP (estrogen/progesterone) Last Pap: 12/12/13. Results were: normal Last mammogram: none  Obstetric History OB History  Gravida Para Term Preterm AB SAB TAB Ectopic Multiple Living  # Outcome Date GA Lbr Len/2nd Weight Sex Delivery Anes PTL Lv  3 SAB 06/03/13 [redacted]w[redacted]d         2 Term 10/02/05 [redacted]w[redacted]d  3.175 kg (7 lb)  CS-Classical     1 Term 09/01/01 [redacted]w[redacted]d 24:00 3.969 kg (8 lb 12 oz) M CS-Classical         Past Medical History  Diagnosis Date  . Depression      pcp  . Seasonal allergies     Past Surgical History  Procedure Laterality Date  . Cesarean section       2002 & 2006     Current outpatient prescriptions:  .  citalopram (CELEXA) 40 MG tablet, Take 40 mg by mouth daily., Disp: , Rfl:  .  Norgestimate-Ethinyl Estradiol Triphasic 0.18/0.215/0.25 MG-35 MCG tablet, Take 1 tablet by mouth daily., Disp: , Rfl:  .  ciprofloxacin (CIPRO) 500 MG tablet, Take 1 tablet (500 mg total) by mouth 2 (two) times daily. (Patient not taking: Reported on 12/15/2014), Disp: 10 tablet, Rfl: 0 No Known Allergies  History  Substance Use Topics  . Smoking status: Never Smoker   . Smokeless tobacco: Never Used  . Alcohol Use: Yes     Comment: occasionally    Family History  Problem Relation Age of Onset  . Cancer - Ovarian Mother 68  . Diabetes Paternal Grandmother   . Heart disease Mother   . Hypertension Father       Review of Systems  Constitutional: negative for fatigue and weight loss Respiratory: negative for cough and wheezing Cardiovascular: negative for chest pain, fatigue and palpitations Gastrointestinal: negative for abdominal pain and change in bowel habits Musculoskeletal:negative for myalgias Neurological: negative for gait problems and tremors Behavioral/Psych: negative for abusive relationship, depression Endocrine: negative for temperature intolerance   Genitourinary:negative for  abnormal menstrual periods, genital lesions, hot flashes, sexual problems and vaginal discharge Integument/breast: negative for breast lump, breast tenderness, nipple discharge and skin lesion(s)    Objective:       BP 126/81 mmHg  Pulse 91  Ht 5\' 6"  (1.676 m)  Wt 111.131 kg (245 lb)  BMI 39.56 kg/m2  LMP 11/22/2014 General:   alert  Skin:   no rash or abnormalities  Lungs:   clear to auscultation bilaterally  Heart:   regular rate and rhythm, S1, S2 normal, no murmur, click, rub or gallop  Breasts:   normal without suspicious masses,  skin or nipple changes or axillary nodes  Abdomen:  normal findings: no organomegaly, soft, non-tender and no hernia  Pelvis:  External genitalia: normal general appearance Urinary system: urethral meatus normal and bladder without fullness, nontender Vaginal: normal without tenderness, induration or masses Cervix: normal appearance Adnexa: normal bimanual exam Uterus: anteverted and non-tender, normal size   Lab Review Urine pregnancy test Labs reviewed yes Radiologic studies reviewed no   Assessment:    Healthy female exam.   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 Contraception: OCP (estrogen/progesterone). Follow up in: 1 year.   Meds ordered this encounter  Medications  . citalopram (CELEXA) 40 MG tablet    Sig: Take 40 mg by mouth daily.   Orders Placed This Encounter  Procedures  . SureSwab, Vaginosis/Vaginitis Plus  . HIV antibody (with reflex)  . Hepatitis B surface antigen  . RPR  . Hepatitis C antibody  . TSH  . CBC with Differential/Platelet  . Ambulatory referral to Psychiatry    Referral Priority:  Routine    Referral Type:  Psychiatric    Referral Reason:  Specialty Services Required    Requested Specialty:  Psychiatry    Number of Visits Requested:  1  . Ambulatory referral to Internal Medicine    Referral Priority:  Routine    Referral Type:  Consultation    Referral Reason:  Specialty Services Required    Requested Specialty:  Internal Medicine    Number of Visits Requested:  1  . Ambulatory referral to Nutrition and Diabetic Education    Referral Priority:  Routine    Referral Type:  Consultation    Referral Reason:  Specialty Services Required    Number of Visits Requested:  1  . Ambulatory referral to Genetics    Referral Priority:  Routine     Referral Type:  Consultation    Referral Reason:  Specialty Services Required    Number of Visits Requested:  1   Educated patient on Nuva Ring, declines at this time.

## 2014-12-16 LAB — HEPATITIS B SURFACE ANTIGEN: HEP B S AG: NEGATIVE

## 2014-12-16 LAB — RPR

## 2014-12-16 LAB — HEPATITIS C ANTIBODY: HCV AB: NEGATIVE

## 2014-12-16 LAB — HIV ANTIBODY (ROUTINE TESTING W REFLEX): HIV: NONREACTIVE

## 2014-12-18 ENCOUNTER — Telehealth: Payer: Self-pay

## 2014-12-18 ENCOUNTER — Telehealth: Payer: Self-pay | Admitting: Genetic Counselor

## 2014-12-18 LAB — PAP IG AND HPV HIGH-RISK: HPV DNA High Risk: NOT DETECTED

## 2014-12-18 NOTE — Telephone Encounter (Signed)
patient has appts with [facility] - [facility] - told patient all of this info

## 2014-12-18 NOTE — Telephone Encounter (Signed)
[facility] in ref to genetic appt. Records in epic

## 2014-12-19 ENCOUNTER — Telehealth: Payer: Self-pay

## 2014-12-19 LAB — SURESWAB, VAGINOSIS/VAGINITIS PLUS
ATOPOBIUM VAGINAE: NOT DETECTED Log (cells/mL)
C. albicans, DNA: NOT DETECTED
C. glabrata, DNA: NOT DETECTED
C. parapsilosis, DNA: NOT DETECTED
C. trachomatis RNA, TMA: NOT DETECTED
C. tropicalis, DNA: NOT DETECTED
Gardnerella vaginalis: NOT DETECTED Log (cells/mL)
LACTOBACILLUS SPECIES: DETECTED Log (cells/mL)
MEGASPHAERA SPECIES: NOT DETECTED Log (cells/mL)
N. GONORRHOEAE RNA, TMA: NOT DETECTED
T. vaginalis RNA, QL TMA: NOT DETECTED

## 2014-12-19 NOTE — Telephone Encounter (Signed)
Left message for patient to call and schedule - [facility] called patient also, [facility]al Health will call patient as well for psychiatry appt per Nettie ElmSylvia, 12/19/14

## 2014-12-19 NOTE — Telephone Encounter (Signed)
[facility] AND LEFT MSG - PATIENT STATED WOULD CALL THEM

## 2014-12-20 LAB — POCT URINALYSIS DIPSTICK
Bilirubin, UA: NEGATIVE
GLUCOSE UA: NEGATIVE
Ketones, UA: NEGATIVE
Leukocytes, UA: NEGATIVE
Nitrite, UA: NEGATIVE
Spec Grav, UA: 1.015
UROBILINOGEN UA: NEGATIVE
pH, UA: 6

## 2014-12-20 NOTE — Addendum Note (Signed)
Addended by: Henriette CombsHATTON, Shericka Johnstone L on: 12/20/2014 11:54 AM   Modules accepted: Orders

## 2014-12-22 ENCOUNTER — Telehealth: Payer: Self-pay

## 2014-12-22 NOTE — Telephone Encounter (Signed)
Behavorial health still trying to reach patient to sch appt - she said would call, but they have not heard from her

## 2015-01-01 ENCOUNTER — Other Ambulatory Visit: Payer: Self-pay

## 2015-01-01 ENCOUNTER — Encounter: Payer: Self-pay | Admitting: Genetic Counselor

## 2015-01-16 ENCOUNTER — Ambulatory Visit: Payer: Self-pay | Admitting: Family

## 2015-01-16 DIAGNOSIS — Z0289 Encounter for other administrative examinations: Secondary | ICD-10-CM

## 2015-02-15 ENCOUNTER — Ambulatory Visit (INDEPENDENT_AMBULATORY_CARE_PROVIDER_SITE_OTHER): Payer: No Typology Code available for payment source | Admitting: Physician Assistant

## 2015-02-15 VITALS — BP 118/82 | HR 90 | Temp 98.1°F | Resp 16 | Ht 66.0 in | Wt 247.0 lb

## 2015-02-15 DIAGNOSIS — B9689 Other specified bacterial agents as the cause of diseases classified elsewhere: Secondary | ICD-10-CM

## 2015-02-15 DIAGNOSIS — N760 Acute vaginitis: Secondary | ICD-10-CM

## 2015-02-15 DIAGNOSIS — N898 Other specified noninflammatory disorders of vagina: Secondary | ICD-10-CM | POA: Diagnosis not present

## 2015-02-15 DIAGNOSIS — Z7251 High risk heterosexual behavior: Secondary | ICD-10-CM | POA: Diagnosis not present

## 2015-02-15 DIAGNOSIS — R300 Dysuria: Secondary | ICD-10-CM | POA: Diagnosis not present

## 2015-02-15 DIAGNOSIS — A499 Bacterial infection, unspecified: Secondary | ICD-10-CM

## 2015-02-15 LAB — POCT WET PREP WITH KOH
KOH Prep POC: NEGATIVE
Trichomonas, UA: NEGATIVE
Yeast Wet Prep HPF POC: NEGATIVE

## 2015-02-15 LAB — POCT URINALYSIS DIPSTICK
Bilirubin, UA: NEGATIVE
Glucose, UA: NEGATIVE
KETONES UA: NEGATIVE
LEUKOCYTES UA: NEGATIVE
NITRITE UA: NEGATIVE
Protein, UA: NEGATIVE
Spec Grav, UA: 1.02
UROBILINOGEN UA: 0.2
pH, UA: 6

## 2015-02-15 LAB — POCT URINE PREGNANCY: Preg Test, Ur: NEGATIVE

## 2015-02-15 LAB — RPR

## 2015-02-15 LAB — HIV ANTIBODY (ROUTINE TESTING W REFLEX): HIV 1&2 Ab, 4th Generation: NONREACTIVE

## 2015-02-15 MED ORDER — FLUCONAZOLE 150 MG PO TABS: 150.0000 mg | ORAL_TABLET | Freq: Once | ORAL | Status: DC

## 2015-02-15 MED ORDER — METRONIDAZOLE 500 MG PO TABS: 500.0000 mg | ORAL_TABLET | Freq: Two times a day (BID) | ORAL | Status: DC

## 2015-02-15 NOTE — Progress Notes (Signed)
Subjective:    Patient ID: Sheila Davis, female    DOB: [birth year], 34 y.o.   MRN: [id number]  HPI Patient presents for lower abdominal pain, dysuria, and vaginal discharge that have been present for past week. Abdominal pain is intermittent and is usually achy, but has been sharp. Discharge is white and gray in color, but consistency is normal. Denies blood or odor from discharge, other urinary sx, bowel sx, and fever. Endorses lower back pain. Abdominal procedures include 2 C-sections. LMP 01/21/15 was shorter than usual and is using TriSprintec birth control. Has missed multiple doses in the past month, but doubles up the following day. Has new monogamous female partner for past 8 months. Condoms are used sometimes. Last pap and STD screen was 12/2014 and was normal. No h/o fibroids or ovarian cyst. Adds that 1 week ago took lavender oil and baking soda bath. Usually just uses dove soap. No new detergents used.    Review of Systems  Constitutional: Negative for fever.  Gastrointestinal: Positive for abdominal pain. Negative for nausea, vomiting, diarrhea and constipation.  Genitourinary: Positive for dysuria and vaginal discharge. Negative for urgency, frequency, hematuria, flank pain, decreased urine volume, vaginal bleeding, difficulty urinating, genital sores, vaginal pain, menstrual problem and dyspareunia.  Musculoskeletal: Positive for back pain.       Objective:   Physical Exam  Constitutional: She is oriented to person, place, and time. She appears well-developed. No distress.  Blood pressure 118/82, pulse 90, temperature 98.1 F (36.7 C), temperature source Oral, resp. rate 16, height 5\' 6"  (1.676 m), weight 247 lb (112.038 kg), last menstrual period 01/21/2015, SpO2 95 %.  HENT:  Head: Normocephalic and atraumatic.  Right Ear: External ear normal.  Left Ear: External ear normal.  Eyes: Conjunctivae are normal. Right eye exhibits no discharge. No scleral icterus.  Cardiovascular:  Normal rate, regular rhythm and normal heart sounds.  Exam reveals no gallop and no friction rub.   No murmur heard. Pulmonary/Chest: Effort normal and breath sounds normal. No respiratory distress. She has no wheezes. She has no rales.  Abdominal: Soft. Bowel sounds are normal. She exhibits no distension and no mass. There is tenderness in the suprapubic area. There is no rigidity, no rebound, no guarding, no tenderness at McBurney's point and negative Murphy's sign. No hernia. Hernia confirmed negative in the right inguinal area and confirmed negative in the left inguinal area.  Genitourinary: Uterus normal. No labial fusion. There is no rash, tenderness, lesion or injury on the right labia. There is no rash, tenderness, lesion or injury on the left labia. Cervix exhibits no motion tenderness, no discharge and no friability. Right adnexum displays no mass, no tenderness and no fullness. Left adnexum displays no mass, no tenderness and no fullness. No erythema, tenderness or bleeding in the vagina. No foreign body around the vagina. No signs of injury around the vagina. Vaginal discharge found.  Lymphadenopathy:       Right: No inguinal adenopathy present.       Left: No inguinal adenopathy present.  Neurological: She is alert and oriented to person, place, and time.  Skin: Skin is warm and dry. No rash noted. She is not diaphoretic. No erythema. No pallor.   Results for orders placed or performed in visit on 02/15/15  POCT urinalysis dipstick  Result Value Ref Range   Color, UA yellow    Clarity, UA clear    Glucose, UA neg    Bilirubin, UA neg    Ketones, UA  neg    Spec Grav, UA 1.020    Blood, UA small    pH, UA 6.0    Protein, UA neg    Urobilinogen, UA 0.2    Nitrite, UA neg    Leukocytes, UA Negative   POCT urine pregnancy  Result Value Ref Range   Preg Test, Ur Negative   POCT Wet Prep with KOH  Result Value Ref Range   Trichomonas, UA Negative    Clue Cells Wet Prep HPF POC  0-5    Epithelial Wet Prep HPF POC 4-12    Yeast Wet Prep HPF POC neg    Bacteria Wet Prep HPF POC large    RBC Wet Prep HPF POC 0-1    WBC Wet Prep HPF POC 0-5    KOH Prep POC Negative       Assessment & Plan:  1. Vaginal discharge 2. Dysuria 3. Unprotected sexual intercourse 4. BV (bacterial vaginosis) Discussed condom safety. Should discontinue baking soda and lavender oil baths. - Urine culture - POCT urinalysis dipstick - POCT urine pregnancy - POCT Wet Prep with KOH - GC/Chlamydia Probe Amp - RPR - HIV antibody - metroNIDAZOLE (FLAGYL) 500 MG tablet; Take 1 tablet (500 mg total) by mouth 2 (two) times daily with a meal. DO NOT CONSUME ALCOHOL WHILE TAKING THIS MEDICATION.  Dispense: 14 tablet; Refill: 0   Adia Crammer PA-C  [facility] 02/15/2015 12:02 PM

## 2015-02-15 NOTE — Patient Instructions (Signed)
Bacterial Vaginosis Bacterial vaginosis is a vaginal infection that occurs when the normal balance of bacteria in the vagina is disrupted. It results from an overgrowth of certain bacteria. This is the most common vaginal infection in women of childbearing age. Treatment is important to prevent complications, especially in pregnant women, as it can cause a premature delivery. CAUSES  Bacterial vaginosis is caused by an increase in harmful bacteria that are normally present in smaller amounts in the vagina. Several different kinds of bacteria can cause bacterial vaginosis. However, the reason that the condition develops is not fully understood. RISK FACTORS Certain activities or behaviors can put you at an increased risk of developing bacterial vaginosis, including:  Having a new sex partner or multiple sex partners.  Douching.  Using an intrauterine device (IUD) for contraception. Women do not get bacterial vaginosis from toilet seats, bedding, swimming pools, or contact with objects around them. SIGNS AND SYMPTOMS  Some women with bacterial vaginosis have no signs or symptoms. Common symptoms include:  Grey vaginal discharge.  A fishlike odor with discharge, especially after sexual intercourse.  Itching or burning of the vagina and vulva.  Burning or pain with urination. DIAGNOSIS  Your health care provider will take a medical history and examine the vagina for signs of bacterial vaginosis. A sample of vaginal fluid may be taken. Your health care provider will look at this sample under a microscope to check for bacteria and abnormal cells. A vaginal pH test may also be done.  TREATMENT  Bacterial vaginosis may be treated with antibiotic medicines. These may be given in the form of a pill or a vaginal cream. A second round of antibiotics may be prescribed if the condition comes back after treatment.  HOME CARE INSTRUCTIONS   Only take over-the-counter or prescription medicines as  directed by your health care provider.  If antibiotic medicine was prescribed, take it as directed. Make sure you finish it even if you start to feel better.  Do not have sex until treatment is completed.  Tell all sexual partners that you have a vaginal infection. They should see their health care provider and be treated if they have problems, such as a mild rash or itching.  Practice safe sex by using condoms and only having one sex partner. SEEK MEDICAL CARE IF:   Your symptoms are not improving after 3 days of treatment.  You have increased discharge or pain.  You have a fever. MAKE SURE YOU:   Understand these instructions.  Will watch your condition.  Will get help right away if you are not doing well or get worse. FOR MORE INFORMATION  Centers for Disease Control and Prevention, Division of STD Prevention: www.cdc.gov/std American Sexual Health Association (ASHA): www.ashastd.org  Document Released: 09/22/2005 Document Revised: 07/13/2013 Document Reviewed: 05/04/2013 ExitCare Patient Information 2015 [employer]. This information is not intended to replace advice given to you by your health care provider. Make sure you discuss any questions you have with your health care provider.  

## 2015-02-16 ENCOUNTER — Encounter: Payer: Self-pay | Admitting: Physician Assistant

## 2015-02-16 LAB — GC/CHLAMYDIA PROBE AMP
CT Probe RNA: NEGATIVE
GC Probe RNA: NEGATIVE

## 2015-02-16 LAB — URINE CULTURE: Colony Count: 45000

## 2015-03-09 ENCOUNTER — Encounter: Payer: Self-pay | Admitting: Certified Nurse Midwife

## 2015-03-09 ENCOUNTER — Ambulatory Visit (INDEPENDENT_AMBULATORY_CARE_PROVIDER_SITE_OTHER): Payer: No Typology Code available for payment source | Admitting: Certified Nurse Midwife

## 2015-03-09 VITALS — BP 120/84 | HR 82 | Temp 98.5°F | Ht 65.0 in | Wt 245.0 lb

## 2015-03-09 DIAGNOSIS — R1031 Right lower quadrant pain: Secondary | ICD-10-CM

## 2015-03-09 DIAGNOSIS — R1032 Left lower quadrant pain: Secondary | ICD-10-CM | POA: Diagnosis not present

## 2015-03-09 LAB — POCT URINE PREGNANCY: Preg Test, Ur: NEGATIVE

## 2015-03-09 LAB — POCT URINALYSIS DIPSTICK
BILIRUBIN UA: NEGATIVE
Blood, UA: NEGATIVE
Glucose, UA: NEGATIVE
Ketones, UA: NEGATIVE
LEUKOCYTES UA: NEGATIVE
NITRITE UA: NEGATIVE
Protein, UA: NEGATIVE
Spec Grav, UA: 1.01
UROBILINOGEN UA: NEGATIVE
pH, UA: 6

## 2015-03-09 MED ORDER — URIBEL 118 MG PO CAPS: 1.0000 | ORAL_CAPSULE | Freq: Four times a day (QID) | ORAL | Status: DC | PRN

## 2015-03-09 NOTE — Progress Notes (Signed)
Patient ID: Sheila Davis, female   DOB: Dmiya 01, 1982, 35 y.o.   MRN: [id number]   Chief Complaint  Patient presents with  . Abdominal Pain    HPI Sheila Davis is a 34 y.o. female.  C/O recent onset of abdominal pain and pressure when she urinates.  Denies hematuria or dysuria.  Lower abdominal pain is bilateral anterior abdomen.  Denies any recent illness or fever.  Is regularly sexually active.  States she takes her pill every day and does not skip or double up.  Denies wanting to change method of contraception.  States that [place] sees a Veterinary [place] in [place].  Has just gotten off of work today.  She was recently treated for BV with flagyl and a UTI with cipro.  Completed antibiotics about 2 weeks ago.   Her last LMP was 02/24/15; stated it was a normal period.  Denies any heavy bleeding or clots.  Stated that with this period she did have a lot of cramping and pain a week before and during her cycle that lasted about 4-5 days.  States that she has BMs every other day that are soft and that she drinks lots of water.  Encouraged to drink cranberry juice and to void after sexual intercourse to prevent UTIs.       HPI  Past Medical History  Diagnosis Date  . Depression     pcp  . Seasonal allergies     Past Surgical History  Procedure Laterality Date  . Cesarean section       2002 & 2006    Family History  Problem Relation Age of Onset  . Cancer - Ovarian Mother 108  . Diabetes Paternal Grandmother   . Heart disease Mother   . Hypertension Father     Social History History  Substance Use Topics  . Smoking status: Never Smoker   . Smokeless tobacco: Never Used  . Alcohol Use: 0.0 oz/week    0 Standard drinks or equivalent per week     Comment: occasionally    No Known Allergies  Current Outpatient Prescriptions  Medication Sig Dispense Refill  . citalopram (CELEXA) 40 MG tablet Take 40 mg by mouth daily.    . Norgestimate-Ethinyl Estradiol Triphasic 0.18/0.215/0.25 MG-35 MCG  tablet Take 1 tablet by mouth daily.    . Meth-Hyo-M Bl-Na Phos-Ph Sal (URIBEL) 118 MG CAPS Take 1 capsule (118 mg total) by mouth every 6 (six) hours as needed. 60 capsule 0   No current facility-administered medications for this visit.    Review of Systems Review of Systems Constitutional: negative for fatigue and weight loss Respiratory: negative for cough and wheezing Cardiovascular: negative for chest pain, fatigue and palpitations Gastrointestinal: negative for abdominal pain and change in bowel habits Genitourinary:negative Integument/breast: negative for nipple discharge Musculoskeletal:negative for myalgias Neurological: negative for gait problems and tremors Behavioral/Psych: negative for abusive relationship, depression Endocrine: negative for temperature intolerance     Blood pressure 120/84, pulse 82, temperature 98.5 F (36.9 C), height  (1.651 m), weight 245 lb (111.131 kg), last menstrual period 02/26/2015.  Physical Exam Physical Exam General:   alert  Skin:   no rash or abnormalities  Lungs:   clear to auscultation bilaterally  Heart:   regular rate and rhythm, S1, S2 normal, no murmur, click, rub or gallop  Breasts:   deferred  Abdomen:  normal findings: no organomegaly, soft, non-tender and no hernia Obese.  + Tenderness over anterior abdomen around the round ligament region bilaterally.  Neg.  CVA tenderness  Pelvis:  External genitalia: normal general appearance Urinary system: urethral meatus normal and bladder without fullness, nontender Vaginal: normal without tenderness, induration or masses     925% of 15 min visit spent on counseling and coordination of care.   Data Reviewed Previous medical hx, labs, meds  Assessment     Pelvic pain Obesity with round ligament pain    Plan    Orders Placed This Encounter  Procedures  . SureSwab, Vaginosis/Vaginitis Plus  . US Transvaginal Non-OB    Standing Status: Future     Number of  Occurrences:      Standing Expiration Date: 05/08/2016    Order Specific Question:  Reason for Exam (SYMPTOM  OR DIAGNOSIS REQUIRED)    Answer:  bilateral lower abdominal pain with recent onset    Order Specific Question:  Preferred imaging location?    Answer:  Internal   Meds ordered this encounter  Medications  . Meth-Hyo-M Bl-Na Phos-Ph Sal (URIBEL) 118 MG CAPS    Sig: Take 1 capsule (118 mg total) by mouth every 6 (six) hours as needed.    Dispense:  60 capsule    Refill:  0     Possible management options include: possible unresolved uti vs bladder cystitis Follow up in 3 weeks after ultrasound to review the results.

## 2015-03-10 LAB — URINE CULTURE: Colony Count: 2000

## 2015-03-12 ENCOUNTER — Telehealth: Payer: Self-pay | Admitting: *Deleted

## 2015-03-12 NOTE — Telephone Encounter (Signed)
Patient is calling for urine culture results. 7:04 Call to patient- she is still having pressure and urgency. Patient notified of her results and that I will let Rachelle know- she may treat her anyway- will see what she wants to do. Sureswab results not back.

## 2015-03-14 ENCOUNTER — Other Ambulatory Visit: Payer: Self-pay | Admitting: Certified Nurse Midwife

## 2015-03-14 DIAGNOSIS — N3090 Cystitis, unspecified without hematuria: Secondary | ICD-10-CM

## 2015-03-14 LAB — SURESWAB, VAGINOSIS/VAGINITIS PLUS
ATOPOBIUM VAGINAE: NOT DETECTED Log (cells/mL)
C. ALBICANS, DNA: NOT DETECTED
C. TROPICALIS, DNA: NOT DETECTED
C. glabrata, DNA: NOT DETECTED
C. parapsilosis, DNA: NOT DETECTED
C. trachomatis RNA, TMA: NOT DETECTED
GARDNERELLA VAGINALIS: NOT DETECTED Log (cells/mL)
LACTOBACILLUS SPECIES: 6.7 Log (cells/mL)
MEGASPHAERA SPECIES: NOT DETECTED Log (cells/mL)
N. gonorrhoeae RNA, TMA: NOT DETECTED
T. VAGINALIS RNA, QL TMA: NOT DETECTED

## 2015-03-14 MED ORDER — NITROFURANTOIN MONOHYD MACRO 100 MG PO CAPS: 100.0000 mg | ORAL_CAPSULE | Freq: Two times a day (BID) | ORAL | Status: AC

## 2015-03-14 NOTE — Telephone Encounter (Signed)
Patient notified per voicemail. 

## 2015-03-14 NOTE — Telephone Encounter (Signed)
I have sent in macrobid for her.  Please tell her that if her symptoms do not improve then we need to see her back for another evaluation.  Thank you.

## 2015-03-15 ENCOUNTER — Other Ambulatory Visit: Payer: No Typology Code available for payment source

## 2015-03-29 ENCOUNTER — Ambulatory Visit: Payer: No Typology Code available for payment source | Admitting: Certified Nurse Midwife

## 2015-04-03 ENCOUNTER — Other Ambulatory Visit: Payer: No Typology Code available for payment source

## 2015-04-11 ENCOUNTER — Other Ambulatory Visit: Payer: Self-pay | Admitting: Certified Nurse Midwife

## 2015-04-11 ENCOUNTER — Telehealth: Payer: Self-pay

## 2015-04-11 NOTE — Telephone Encounter (Signed)
[facility] called, they have been trying to schedule this patient for the past 3 months - she will not return phone calls - they have sent letters as well - looked in past appts and has cancelled other appts as well, ultrasounds, etc

## 2015-04-21 ENCOUNTER — Ambulatory Visit (INDEPENDENT_AMBULATORY_CARE_PROVIDER_SITE_OTHER): Payer: No Typology Code available for payment source | Admitting: Family Medicine

## 2015-04-21 VITALS — BP 118/76 | HR 77 | Temp 98.3°F | Resp 16 | Ht 67.0 in | Wt 249.4 lb

## 2015-04-21 DIAGNOSIS — H9201 Otalgia, right ear: Secondary | ICD-10-CM

## 2015-04-21 DIAGNOSIS — T161XXA Foreign body in right ear, initial encounter: Secondary | ICD-10-CM | POA: Diagnosis not present

## 2015-04-21 NOTE — Progress Notes (Signed)
  Subjective:  Patient ID: Sheila Davis, female    DOB: 1981-06-11  Age: 34 y.o. MRN: [id number]  For the past week or so the patient has been having some discomfort in her right ear and feeling stuffy and it. She has used Q-tips in the past.   Objective:   Left TM is normal. Right TM is occluded by some cotton in the canal. Under direct visualization using the headlamp and alligator forceps the cotton was grabbed and pulled out. It looks to be the cotton tip of a Q-tip. Patient tolerated the procedure well.  Assessment & Plan:   Assessment: Foreign body right ear, removed  Plan: Patient Instructions  No special treatment is needed. Return if further problems or concerns.     HOPPER,DAVID, MD 04/21/2015

## 2015-04-21 NOTE — Patient Instructions (Signed)
No special treatment is needed. Return if further problems or concerns.

## 2015-07-16 ENCOUNTER — Ambulatory Visit (INDEPENDENT_AMBULATORY_CARE_PROVIDER_SITE_OTHER): Payer: No Typology Code available for payment source | Admitting: Urgent Care

## 2015-07-16 VITALS — BP 128/82 | HR 102 | Temp 98.2°F | Resp 17 | Ht 66.5 in | Wt 239.0 lb

## 2015-07-16 DIAGNOSIS — R102 Pelvic and perineal pain: Secondary | ICD-10-CM

## 2015-07-16 LAB — POCT URINALYSIS DIP (MANUAL ENTRY)
Bilirubin, UA: NEGATIVE
GLUCOSE UA: NEGATIVE
Ketones, POC UA: NEGATIVE
Leukocytes, UA: NEGATIVE
NITRITE UA: NEGATIVE
PH UA: 6.5
Protein Ur, POC: NEGATIVE
SPEC GRAV UA: 1.02
UROBILINOGEN UA: 0.2

## 2015-07-16 LAB — POCT WET + KOH PREP
TRICH BY WET PREP: ABSENT
Yeast by KOH: ABSENT
Yeast by wet prep: ABSENT

## 2015-07-16 LAB — HIV ANTIBODY (ROUTINE TESTING W REFLEX): HIV 1&2 Ab, 4th Generation: NONREACTIVE

## 2015-07-16 LAB — POC MICROSCOPIC URINALYSIS (UMFC)

## 2015-07-16 LAB — POCT URINE PREGNANCY: Preg Test, Ur: NEGATIVE

## 2015-07-16 MED ORDER — FLUCONAZOLE 150 MG PO TABS: 150.0000 mg | ORAL_TABLET | Freq: Once | ORAL | Status: DC

## 2015-07-16 MED ORDER — CIPROFLOXACIN HCL 500 MG PO TABS: 500.0000 mg | ORAL_TABLET | Freq: Two times a day (BID) | ORAL | Status: DC

## 2015-07-16 NOTE — Progress Notes (Signed)
MRN: [id number] DOB: 1981/01/30  Subjective:   Sheila Davis is a 34 y.o. female presenting for chief complaint of Abdominal Pain and Back Pain  Reports 1 week history of pelvic pain. Pain is pressure type sensation, intermittently radiates toward her back, is different from her menstrual cramps. LMP was 09/27-10/11/2014, was regular. Has associated malodorous urine and intermittent urinary frequency. Has tried Tylenol and ibuprofen with some relief. She has been sexually active with one partner but is not sure it was a monogamous relationship. Denies fever, abdominal pain, n/v, dysuria, hematuria, cloudy urine, flank pain, genital rashes, vaginal discharge, vaginal irritation, constipation, hard stools. Smokes 2 cigarettes per day. Denies any other aggravating or relieving factors, no other questions or concerns.  Marijean has a current medication list which includes the following prescription(s): norgestimate-ethinyl estradiol triphasic. Also has No Known Allergies.  Tanaiya  has a past medical history of Depression and Seasonal allergies. Also  has past surgical history that includes Cesarean section.  Objective:   Vitals: BP 128/82 mmHg  Pulse 102  Temp(Src) 98.2 F (36.8 C) (Oral)  Resp 17  Ht 5' 6.5" (1.689 m)  Wt 239 lb (108.41 kg)  BMI 38.00 kg/m2  SpO2 98%  LMP 07/03/2015  Physical Exam  Constitutional: She is oriented to person, place, and time. She appears well-developed and well-nourished.  Cardiovascular: Normal rate, regular rhythm and intact distal pulses.  Exam reveals no gallop and no friction rub.   No murmur heard. Pulmonary/Chest: No respiratory distress. She has no wheezes. She has no rales.  Abdominal: Soft. Bowel sounds are normal. She exhibits no distension and no mass. There is tenderness (pelvic).  No CVA tenderness.  Musculoskeletal: She exhibits no edema.  Neurological: She is alert and oriented to person, place, and time.  Skin: Skin is warm and dry. No  rash noted. No erythema. No pallor.   Results for orders placed or performed in visit on 07/16/15 (from the past 24 hour(s))  POCT urinalysis dipstick     Status: Abnormal   Collection Time: 07/16/15  8:50 AM  Result Value Ref Range   Color, UA yellow yellow   Clarity, UA clear clear   Glucose, UA negative negative   Bilirubin, UA negative negative   Ketones, POC UA negative negative   Spec Grav, UA 1.020    Blood, UA small (A) negative   pH, UA 6.5    Protein Ur, POC negative negative   Urobilinogen, UA 0.2    Nitrite, UA Negative Negative   Leukocytes, UA Negative Negative  POCT Microscopic Urinalysis ([facility])     Status: Abnormal   Collection Time: 07/16/15  8:50 AM  Result Value Ref Range   WBC,UR,HPF,POC Few (A) None WBC/hpf   RBC,UR,HPF,POC Few (A) None RBC/hpf   Bacteria None None   Mucus Present (A) Absent   Epithelial Cells, UR Per Microscopy Moderate (A) None cells/hpf  POCT urine pregnancy     Status: None   Collection Time: 07/16/15  8:50 AM  Result Value Ref Range   Preg Test, Ur Negative Negative  POCT Wet + KOH Prep ([facility])     Status: Abnormal   Collection Time: 07/16/15  9:26 AM  Result Value Ref Range   Yeast by KOH Absent Present, Absent   Yeast by wet prep Absent Present, Absent   WBC by wet prep None None, Few   Clue Cells Wet Prep HPF POC None None   Trich by wet prep Absent Present, Absent  Bacteria Wet Prep HPF POC Few None, Few   Epithelial Cells By [employer] Pref ([facility]) Many (A) None, Few   RBC,UR,HPF,POC None None RBC/hpf   Assessment and Plan :   1. Pelvic pain in female - Labs pending, patient declined pelvic exam and pelvic ultrasound today. She prefers to wait for STI test results. In the meantime I offered her clinical treatment for UTI. Advised patient that if she has good response to Cipro in the next 3 days she probably had UTI. Otherwise, if the patient has no improvement, she agreed to return for pelvic exam and possible pelvic ultrasound.  Diflucan provided for antibiotic associated yeast infection.  Wallis Bamberg, PA-C [facility] [phone] 07/16/2015 8:37 AM

## 2015-07-16 NOTE — Patient Instructions (Signed)

## 2015-07-17 LAB — GC/CHLAMYDIA PROBE AMP
CT PROBE, AMP APTIMA: NEGATIVE
GC Probe RNA: NEGATIVE

## 2015-07-17 LAB — RPR

## 2015-08-16 ENCOUNTER — Ambulatory Visit (INDEPENDENT_AMBULATORY_CARE_PROVIDER_SITE_OTHER): Payer: No Typology Code available for payment source | Admitting: Family Medicine

## 2015-08-16 VITALS — BP 128/90 | HR 82 | Temp 97.9°F | Resp 16 | Ht 66.5 in | Wt 231.2 lb

## 2015-08-16 DIAGNOSIS — M545 Low back pain: Secondary | ICD-10-CM | POA: Diagnosis not present

## 2015-08-16 DIAGNOSIS — R55 Syncope and collapse: Secondary | ICD-10-CM | POA: Diagnosis not present

## 2015-08-16 DIAGNOSIS — R51 Headache: Secondary | ICD-10-CM

## 2015-08-16 DIAGNOSIS — R109 Unspecified abdominal pain: Secondary | ICD-10-CM | POA: Diagnosis not present

## 2015-08-16 DIAGNOSIS — J0100 Acute maxillary sinusitis, unspecified: Secondary | ICD-10-CM | POA: Diagnosis not present

## 2015-08-16 DIAGNOSIS — R42 Dizziness and giddiness: Secondary | ICD-10-CM

## 2015-08-16 DIAGNOSIS — J3489 Other specified disorders of nose and nasal sinuses: Secondary | ICD-10-CM | POA: Diagnosis not present

## 2015-08-16 DIAGNOSIS — R002 Palpitations: Secondary | ICD-10-CM

## 2015-08-16 DIAGNOSIS — R519 Headache, unspecified: Secondary | ICD-10-CM

## 2015-08-16 LAB — POCT URINALYSIS DIP (MANUAL ENTRY)
Blood, UA: NEGATIVE
GLUCOSE UA: NEGATIVE
Leukocytes, UA: NEGATIVE
NITRITE UA: NEGATIVE
PH UA: 7
SPEC GRAV UA: 1.02
UROBILINOGEN UA: 0.2

## 2015-08-16 LAB — POC MICROSCOPIC URINALYSIS (UMFC)

## 2015-08-16 LAB — POCT CBC
Granulocyte percent: 69.6 %G (ref 37–80)
HCT, POC: 38.4 % (ref 37.7–47.9)
HEMOGLOBIN: 12.4 g/dL (ref 12.2–16.2)
LYMPH, POC: 1.7 (ref 0.6–3.4)
MCH: 26.5 pg — AB (ref 27–31.2)
MCHC: 32.3 g/dL (ref 31.8–35.4)
MCV: 82 fL (ref 80–97)
MID (cbc): 0.4 (ref 0–0.9)
MPV: 8.3 fL (ref 0–99.8)
PLATELET COUNT, POC: 356 10*3/uL (ref 142–424)
POC Granulocyte: 4.8 (ref 2–6.9)
POC LYMPH PERCENT: 24.3 %L (ref 10–50)
POC MID %: 6.1 %M (ref 0–12)
RBC: 4.68 M/uL (ref 4.04–5.48)
RDW, POC: 13.5 %
WBC: 6.9 10*3/uL (ref 4.6–10.2)

## 2015-08-16 LAB — POCT URINE PREGNANCY: PREG TEST UR: NEGATIVE

## 2015-08-16 LAB — GLUCOSE, POCT (MANUAL RESULT ENTRY): POC GLUCOSE: 91 mg/dL (ref 70–99)

## 2015-08-16 MED ORDER — FLUTICASONE PROPIONATE 50 MCG/ACT NA SUSP: 2.0000 | Freq: Every day | NASAL | Status: DC

## 2015-08-16 MED ORDER — AMOXICILLIN-POT CLAVULANATE 875-125 MG PO TABS: 1.0000 | ORAL_TABLET | Freq: Two times a day (BID) | ORAL | Status: DC

## 2015-08-16 NOTE — Patient Instructions (Signed)
Your low back pain is likely a flair of previous pain or "mechanical low back pain", so can use heat or ice, range of motion, tylenol or advil over the counter if needed. See more info below. Return to the clinic or go to the nearest emergency room if any of your symptoms worsen or new symptoms occur.  Your dizziness and headache may be a combination of the sinus pressure fromm current infection and need for some more fluids. Increase fluid intake (water in best). Saline nasal spray and mucinex if needed for sinus pressure. If sinuses not improving in next few days - can start printed antibiotic, but the antibiotic can also cause some stomach upset/diarrhea. If dizziness persists, or feelings of passing out return - come back here for evaluation or to the emergency room if needed.   Dizziness Dizziness is a common problem. It is a feeling of unsteadiness or light-headedness. You may feel like you are about to faint. Dizziness can lead to injury if you stumble or fall. Anyone can become dizzy, but dizziness is more common in older adults. This condition can be caused by a number of things, including medicines, dehydration, or illness. HOME CARE INSTRUCTIONS Taking these steps may help with your condition: Eating and Drinking  Drink enough fluid to keep your urine clear or pale yellow. This helps to keep you from becoming dehydrated. Try to drink more clear fluids, such as water.  Do not drink alcohol.  Limit your caffeine intake if directed by your health care provider.  Limit your salt intake if directed by your health care provider. Activity  Avoid making quick movements.  Rise slowly from chairs and steady yourself until you feel okay.  In the morning, first sit up on the side of the bed. When you feel okay, stand slowly while you hold onto something until you know that your balance is fine.  Move your legs often if you need to stand in one place for a long time. Tighten and relax your  muscles in your legs while you are standing.  Do not drive or operate heavy machinery if you feel dizzy.  Avoid bending down if you feel dizzy. Place items in your home so that they are easy for you to reach without leaning over. Lifestyle  Do not use any tobacco products, including cigarettes, chewing tobacco, or electronic cigarettes. If you need help quitting, ask your health care provider.  Try to reduce your stress level, such as with yoga or meditation. Talk with your health care provider if you need help. General Instructions  Watch your dizziness for any changes.  Take medicines only as directed by your health care provider. Talk with your health care provider if you think that your dizziness is caused by a medicine that you are taking.  Tell a friend or a family member that you are feeling dizzy. If he or she notices any changes in your behavior, have this person call your health care provider.  Keep all follow-up visits as directed by your health care provider. This is important. SEEK MEDICAL CARE IF:  Your dizziness does not go away.  Your dizziness or light-headedness gets worse.  You feel nauseous.  You have reduced hearing.  You have new symptoms.  You are unsteady on your feet or you feel like the room is spinning. SEEK IMMEDIATE MEDICAL CARE IF:  You vomit or have diarrhea and are unable to eat or drink anything.  You have problems talking, walking, swallowing, or  using your arms, hands, or legs.  You feel generally weak.  You are not thinking clearly or you have trouble forming sentences. It may take a friend or family member to notice this.  You have chest pain, abdominal pain, shortness of breath, or sweating.  Your vision changes.  You notice any bleeding.  You have a headache.  You have neck pain or a stiff neck.  You have a fever.   This information is not intended to replace advice given to you by your health care provider. Make sure you  discuss any questions you have with your health care provider.   Document Released: 03/18/2001 Document Revised: 02/06/2015 Document Reviewed: 09/18/2014 Elsevier Interactive Patient Education 2016 [employer]  Back Pain, Adult Back pain is very common in adults.The cause of back pain is rarely dangerous and the pain often gets better over time.The cause of your back pain may not be known. Some common causes of back pain include:  Strain of the muscles or ligaments supporting the spine.  Wear and tear (degeneration) of the spinal disks.  Arthritis.  Direct injury to the back. For many people, back pain may return. Since back pain is rarely dangerous, most people can learn to manage this condition on their own. HOME CARE INSTRUCTIONS Watch your back pain for any changes. The following actions may help to lessen any discomfort you are feeling:  Remain active. It is stressful on your back to sit or stand in one place for long periods of time. Do not sit, drive, or stand in one place for more than 30 minutes at a time. Take short walks on even surfaces as soon as you are able.Try to increase the length of time you walk each day.  Exercise regularly as directed by your health care provider. Exercise helps your back heal faster. It also helps avoid future injury by keeping your muscles strong and flexible.  Do not stay in bed.Resting more than 1-2 days can delay your recovery.  Pay attention to your body when you bend and lift. The most comfortable positions are those that put less stress on your recovering back. Always use proper lifting techniques, including:  Bending your knees.  Keeping the load close to your body.  Avoiding twisting.  Find a comfortable position to sleep. Use a firm mattress and lie on your side with your knees slightly bent. If you lie on your back, put a pillow under your knees.  Avoid feeling anxious or stressed.Stress increases muscle tension and can  worsen back pain.It is important to recognize when you are anxious or stressed and learn ways to manage it, such as with exercise.  Take medicines only as directed by your health care provider. Over-the-counter medicines to reduce pain and inflammation are often the most helpful.Your health care provider may prescribe muscle relaxant drugs.These medicines help dull your pain so you can more quickly return to your normal activities and healthy exercise.  Apply ice to the injured area:  Put ice in a plastic bag.  Place a towel between your skin and the bag.  Leave the ice on for 20 minutes, 2-3 times a day for the first 2-3 days. After that, ice and heat may be alternated to reduce pain and spasms.  Maintain a healthy weight. Excess weight puts extra stress on your back and makes it difficult to maintain good posture. SEEK MEDICAL CARE IF:  You have pain that is not relieved with rest or medicine.  You have increasing  pain going down into the legs or buttocks.  You have pain that does not improve in one week.  You have night pain.  You lose weight.  You have a fever or chills. SEEK IMMEDIATE MEDICAL CARE IF:   You develop new bowel or bladder control problems.  You have unusual weakness or numbness in your arms or legs.  You develop nausea or vomiting.  You develop abdominal pain.  You feel faint.   This information is not intended to replace advice given to you by your health care provider. Make sure you discuss any questions you have with your health care provider.   Document Released: 09/22/2005 Document Revised: 10/13/2014 Document Reviewed: 01/24/2014 Elsevier Interactive Patient Education 2016 [employer]   Sinusitis, Adult Sinusitis is redness, soreness, and inflammation of the paranasal sinuses. Paranasal sinuses are air pockets within the bones of your face. They are located beneath your eyes, in the middle of your forehead, and above your eyes. In healthy  paranasal sinuses, mucus is able to drain out, and air is able to circulate through them by way of your nose. However, when your paranasal sinuses are inflamed, mucus and air can become trapped. This can allow bacteria and other germs to grow and cause infection. Sinusitis can develop quickly and last only a short time (acute) or continue over a long period (chronic). Sinusitis that lasts for more than 12 weeks is considered chronic. CAUSES Causes of sinusitis include:  Allergies.  Structural abnormalities, such as displacement of the cartilage that separates your nostrils (deviated septum), which can decrease the air flow through your nose and sinuses and affect sinus drainage.  Functional abnormalities, such as when the small hairs (cilia) that line your sinuses and help remove mucus do not work properly or are not present. SIGNS AND SYMPTOMS Symptoms of acute and chronic sinusitis are the same. The primary symptoms are pain and pressure around the affected sinuses. Other symptoms include:  Upper toothache.  Earache.  Headache.  Bad breath.  Decreased sense of smell and taste.  A cough, which worsens when you are lying flat.  Fatigue.  Fever.  Thick drainage from your nose, which often is green and may contain pus (purulent).  Swelling and warmth over the affected sinuses. DIAGNOSIS Your health care provider will perform a physical exam. During your exam, your health care provider may perform any of the following to help determine if you have acute sinusitis or chronic sinusitis:  Look in your nose for signs of abnormal growths in your nostrils (nasal polyps).  Tap over the affected sinus to check for signs of infection.  View the inside of your sinuses using an imaging device that has a light attached (endoscope). If your health care provider suspects that you have chronic sinusitis, one or more of the following tests may be recommended:  Allergy tests.  Nasal culture.  A sample of mucus is taken from your nose, sent to a lab, and screened for bacteria.  Nasal cytology. A sample of mucus is taken from your nose and examined by your health care provider to determine if your sinusitis is related to an allergy. TREATMENT Most cases of acute sinusitis are related to a viral infection and will resolve on their own within 10 days. Sometimes, medicines are prescribed to help relieve symptoms of both acute and chronic sinusitis. These may include pain medicines, decongestants, nasal steroid sprays, or saline sprays. However, for sinusitis related to a bacterial infection, your health care provider will  prescribe antibiotic medicines. These are medicines that will help kill the bacteria causing the infection. Rarely, sinusitis is caused by a fungal infection. In these cases, your health care provider will prescribe antifungal medicine. For some cases of chronic sinusitis, surgery is needed. Generally, these are cases in which sinusitis recurs more than 3 times per year, despite other treatments. HOME CARE INSTRUCTIONS  Drink plenty of water. Water helps thin the mucus so your sinuses can drain more easily.  Use a humidifier.  Inhale steam 3-4 times a day (for example, sit in the bathroom with the shower running).  Apply a warm, moist washcloth to your face 3-4 times a day, or as directed by your health care provider.  Use saline nasal sprays to help moisten and clean your sinuses.  Take medicines only as directed by your health care provider.  If you were prescribed either an antibiotic or antifungal medicine, finish it all even if you start to feel better. SEEK IMMEDIATE MEDICAL CARE IF:  You have increasing pain or severe headaches.  You have nausea, vomiting, or drowsiness.  You have swelling around your face.  You have vision problems.  You have a stiff neck.  You have difficulty breathing.   This information is not intended to replace advice given  to you by your health care provider. Make sure you discuss any questions you have with your health care provider.   Document Released: 09/22/2005 Document Revised: 10/13/2014 Document Reviewed: 10/07/2011 Elsevier Interactive Patient Education [employer]

## 2015-08-16 NOTE — Progress Notes (Addendum)
Subjective:    Patient ID: Sheila Davis, female    DOB: [birth year], 34 y.o.   MRN: [id number] This chart was scribed for Sheila Staggers, MD by Littie Deeds, Medical Scribe. This patient was seen in Room 6 and the patient's care was started at 9:10 AM.    HPI HPI Comments: Sheila Davis is a 34 y.o. female who presents to the [facility] complaining of dizziness that started earlier this morning while at work. This was accompanied with nausea, lightheadedness (feeling near-syncopal), fatigue, palpitations, and headache. She was feeling more anxious this morning. She also reports having aching lower back pain and lower abdominal pressure. Patient notes that she has had nasal congestion with greenish nasal discharge since 6 days ago. She has been taking OTC medications for this such as Sudafed. She has been taking ibuprofen for the back pain. Patient notes she has been urinating less, but has also been drinking less fluids. She denies chest pain. Her last menstrual period was normal and denies possibility of pregnancy. She is on birth control pills and states she has not missed any doses. She reports having a history of borderline iron-deficiency anemia but is not on any iron supplements. 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  Patient Active Problem List   Diagnosis Date Noted  . Pelvic pain in female 07/16/2015  . Trichomonal vulvovaginitis 01/22/2014   Past Medical History  Diagnosis Date  . Depression     pcp  . Seasonal allergies    Past Surgical History  Procedure Laterality Date  . Cesarean section       2002 & 2006   No Known Allergies Prior to Admission medications   Medication Sig Start Date End Date Taking? Authorizing Provider  Norgestimate-Ethinyl Estradiol Triphasic 0.18/0.215/0.25 MG-35 MCG tablet Take 1 tablet by mouth daily.   Yes Historical Provider, MD  ciprofloxacin (CIPRO) 500 MG tablet Take 1 tablet (500 mg total)  by mouth 2 (two) times daily. Patient not taking: Reported on 08/16/2015 07/16/15   Wallis Bamberg, PA-C  fluconazole (DIFLUCAN) 150 MG tablet Take 1 tablet (150 mg total) by mouth once. Repeat 72 hours later if needed Patient not taking: Reported on 08/16/2015 07/16/15   Wallis Bamberg, PA-C   Social History   Social History  . Marital Status: Single    Spouse Name: N/A  . Number of Children: N/A  . Years of Education: N/A   Occupational History  . Not on file.   Social History Main Topics  . Smoking status: Current Every Day Smoker -- 0.20 packs/day for .5 years    Types: Cigarettes  . Smokeless tobacco: Never Used  . Alcohol Use: 0.0 oz/week    0 Standard drinks or equivalent per week     Comment: occasionally  . Drug Use: No  . Sexual Activity:    Partners: Male    Birth Control/ Protection: Pill   Other Topics Concern  . Not on file   Social History Narrative     Review of Systems  Constitutional: Positive for fatigue.  HENT: Positive for congestion.   Cardiovascular: Positive for palpitations. Negative for chest pain.  Gastrointestinal: Positive for nausea and abdominal pain.  Genitourinary: Positive for decreased urine volume.  Musculoskeletal: Positive for back pain.  Neurological: Positive for dizziness, light-headedness and headaches.       Objective:   Physical Exam  Constitutional: She is oriented to person, place, and time.  She appears well-developed and well-nourished. No distress.  HENT:  Head: Normocephalic and atraumatic.  Nose: Right sinus exhibits maxillary sinus tenderness and frontal sinus tenderness.  Mouth/Throat: Oropharynx is clear and moist. No oropharyngeal exudate.  Eyes: EOM are normal. Pupils are equal, round, and reactive to light. Right eye exhibits no nystagmus. Left eye exhibits no nystagmus.  Neck: Neck supple.  Cardiovascular: Normal rate, regular rhythm and normal heart sounds.  Exam reveals no friction rub.   No murmur  heard. Pulmonary/Chest: Effort normal and breath sounds normal. No respiratory distress.  Abdominal: Soft. Bowel sounds are normal. There is tenderness (minimal suprapubic. ).  Musculoskeletal: She exhibits no edema.  Minimal discomfort at paraspinal muscles of lumbar spine. No focal bony tenderness. Similar location of previous discomfort with work.  Neurological: She is alert and oriented to person, place, and time. No cranial nerve deficit. She displays a negative Romberg sign.  No pronator drift.  Skin: Skin is warm and dry. No rash noted.  Psychiatric: She has a normal mood and affect. Her behavior is normal.  Nursing note and vitals reviewed.   EKG - normal sinus rhythm, no acute findings.  Filed Vitals:   08/16/15 0845  BP: 128/90  Pulse: 82  Temp: 97.9 F (36.6 C)  TempSrc: Oral  Resp: 16  Height: 5' 6.5" (1.689 m)  Weight: 231 lb 3.2 oz (104.872 kg)  SpO2: 97%       Assessment & Plan:   Rainna Hun is a 34 y.o. female Dizziness - Plan: EKG 12-Lead, POCT CBC, POCT glucose (manual entry), POCT urine pregnancy  Near syncope - Plan: EKG 12-Lead, POCT CBC, POCT glucose (manual entry)  Low back pain without sciatica, unspecified back pain laterality - Plan: POCT urine pregnancy  Nonintractable headache, unspecified chronicity pattern, unspecified headache type  Sinus pressure - Plan: amoxicillin-clavulanate (AUGMENTIN) 875-125 MG tablet  Abdominal pressure - Plan: POCT urinalysis dipstick, POCT Microscopic Urinalysis ([facility]), POCT urine pregnancy  Palpitations  Acute maxillary sinusitis, recurrence not specified - Plan: amoxicillin-clavulanate (AUGMENTIN) 875-125 MG tablet   Suspected upper respiratory infection, possible early maxillary sinusitis causing headache, dizziness, sinus pressure. Reassuring CBC and urinalysis, reassuring EKG.   -Trial of fluids and ORT at home. Rest, out of work note provided.  -If sinus pressure and pain persists, especially with  persistent discolored nasal discharge, prescribed Augmentin for her to fill. Side effects discussed.  Low back pain appears to be mechanical/muscle skeletal low back pain. Similar symptoms in the past, with her work. No acute findings on exam, symptomatic care, return if symptoms persist.  Abdominal pressure-reassuring CBC and urine overall. RTC precautions if persistent or worsening symptoms.  Meds ordered this encounter  Medications  . amoxicillin-clavulanate (AUGMENTIN) 875-125 MG tablet    Sig: Take 1 tablet by mouth 2 (two) times daily.    Dispense:  20 tablet    Refill:  0  . fluticasone (FLONASE) 50 MCG/ACT nasal spray    Sig: Place 2 sprays into both nostrils daily.    Dispense:  16 g    Refill:  6   Patient Instructions  Your low back pain is likely a flair of previous pain or "mechanical low back pain", so can use heat or ice, range of motion, tylenol or advil over the counter if needed. See more info below. Return to the clinic or go to the nearest emergency room if any of your symptoms worsen or new symptoms occur.  Your dizziness and headache may be a combination of  the sinus pressure fromm current infection and need for some more fluids. Increase fluid intake (water in best). Saline nasal spray and mucinex if needed for sinus pressure. If sinuses not improving in next few days - can start printed antibiotic, but the antibiotic can also cause some stomach upset/diarrhea. If dizziness persists, or feelings of passing out return - come back here for evaluation or to the emergency room if needed.   Dizziness Dizziness is a common problem. It is a feeling of unsteadiness or light-headedness. You may feel like you are about to faint. Dizziness can lead to injury if you stumble or fall. Anyone can become dizzy, but dizziness is more common in older adults. This condition can be caused by a number of things, including medicines, dehydration, or illness. HOME CARE INSTRUCTIONS Taking  these steps may help with your condition: Eating and Drinking  Drink enough fluid to keep your urine clear or pale yellow. This helps to keep you from becoming dehydrated. Try to drink more clear fluids, such as water.  Do not drink alcohol.  Limit your caffeine intake if directed by your health care provider.  Limit your salt intake if directed by your health care provider. Activity  Avoid making quick movements.  Rise slowly from chairs and steady yourself until you feel okay.  In the morning, first sit up on the side of the bed. When you feel okay, stand slowly while you hold onto something until you know that your balance is fine.  Move your legs often if you need to stand in one place for a long time. Tighten and relax your muscles in your legs while you are standing.  Do not drive or operate heavy machinery if you feel dizzy.  Avoid bending down if you feel dizzy. Place items in your home so that they are easy for you to reach without leaning over. Lifestyle  Do not use any tobacco products, including cigarettes, chewing tobacco, or electronic cigarettes. If you need help quitting, ask your health care provider.  Try to reduce your stress level, such as with yoga or meditation. Talk with your health care provider if you need help. General Instructions  Watch your dizziness for any changes.  Take medicines only as directed by your health care provider. Talk with your health care provider if you think that your dizziness is caused by a medicine that you are taking.  Tell a friend or a family member that you are feeling dizzy. If he or she notices any changes in your behavior, have this person call your health care provider.  Keep all follow-up visits as directed by your health care provider. This is important. SEEK MEDICAL CARE IF:  Your dizziness does not go away.  Your dizziness or light-headedness gets worse.  You feel nauseous.  You have reduced hearing.  You  have new symptoms.  You are unsteady on your feet or you feel like the room is spinning. SEEK IMMEDIATE MEDICAL CARE IF:  You vomit or have diarrhea and are unable to eat or drink anything.  You have problems talking, walking, swallowing, or using your arms, hands, or legs.  You feel generally weak.  You are not thinking clearly or you have trouble forming sentences. It may take a friend or family member to notice this.  You have chest pain, abdominal pain, shortness of breath, or sweating.  Your vision changes.  You notice any bleeding.  You have a headache.  You have neck pain or a  stiff neck.  You have a fever.   This information is not intended to replace advice given to you by your health care provider. Make sure you discuss any questions you have with your health care provider.   Document Released: 03/18/2001 Document Revised: 02/06/2015 Document Reviewed: 09/18/2014 Elsevier Interactive Patient Education 2016 Elsevier Inc.  Back Pain, Adult Back pain is very common in adults.The cause of back pain is rarely dangerous and the pain often gets better over time.The cause of your back pain may not be known. Some common causes of back pain include:  Strain of the muscles or ligaments supporting the spine.  Wear and tear (degeneration) of the spinal disks.  Arthritis.  Direct injury to the back. For many people, back pain may return. Since back pain is rarely dangerous, most people can learn to manage this condition on their own. HOME CARE INSTRUCTIONS Watch your back pain for any changes. The following actions may help to lessen any discomfort you are feeling:  Remain active. It is stressful on your back to sit or stand in one place for long periods of time. Do not sit, drive, or stand in one place for more than 30 minutes at a time. Take short walks on even surfaces as soon as you are able.Try to increase the length of time you walk each day.  Exercise regularly as  directed by your health care provider. Exercise helps your back heal faster. It also helps avoid future injury by keeping your muscles strong and flexible.  Do not stay in bed.Resting more than 1-2 days can delay your recovery.  Pay attention to your body when you bend and lift. The most comfortable positions are those that put less stress on your recovering back. Always use proper lifting techniques, including:  Bending your knees.  Keeping the load close to your body.  Avoiding twisting.  Find a comfortable position to sleep. Use a firm mattress and lie on your side with your knees slightly bent. If you lie on your back, put a pillow under your knees.  Avoid feeling anxious or stressed.Stress increases muscle tension and can worsen back pain.It is important to recognize when you are anxious or stressed and learn ways to manage it, such as with exercise.  Take medicines only as directed by your health care provider. Over-the-counter medicines to reduce pain and inflammation are often the most helpful.Your health care provider may prescribe muscle relaxant drugs.These medicines help dull your pain so you can more quickly return to your normal activities and healthy exercise.  Apply ice to the injured area:  Put ice in a plastic bag.  Place a towel between your skin and the bag.  Leave the ice on for 20 minutes, 2-3 times a day for the first 2-3 days. After that, ice and heat may be alternated to reduce pain and spasms.  Maintain a healthy weight. Excess weight puts extra stress on your back and makes it difficult to maintain good posture. SEEK MEDICAL CARE IF:  You have pain that is not relieved with rest or medicine.  You have increasing pain going down into the legs or buttocks.  You have pain that does not improve in one week.  You have night pain.  You lose weight.  You have a fever or chills. SEEK IMMEDIATE MEDICAL CARE IF:   You develop new bowel or bladder  control problems.  You have unusual weakness or numbness in your arms or legs.  You develop nausea or  vomiting.  You develop abdominal pain.  You feel faint.   This information is not intended to replace advice given to you by your health care provider. Make sure you discuss any questions you have with your health care provider.   Document Released: 09/22/2005 Document Revised: 10/13/2014 Document Reviewed: 01/24/2014 Elsevier Interactive Patient Education 2016 Elsevier Inc.   Sinusitis, Adult Sinusitis is redness, soreness, and inflammation of the paranasal sinuses. Paranasal sinuses are air pockets within the bones of your face. They are located beneath your eyes, in the middle of your forehead, and above your eyes. In healthy paranasal sinuses, mucus is able to drain out, and air is able to circulate through them by way of your nose. However, when your paranasal sinuses are inflamed, mucus and air can become trapped. This can allow bacteria and other germs to grow and cause infection. Sinusitis can develop quickly and last only a short time (acute) or continue over a long period (chronic). Sinusitis that lasts for more than 12 weeks is considered chronic. CAUSES Causes of sinusitis include:  Allergies.  Structural abnormalities, such as displacement of the cartilage that separates your nostrils (deviated septum), which can decrease the air flow through your nose and sinuses and affect sinus drainage.  Functional abnormalities, such as when the small hairs (cilia) that line your sinuses and help remove mucus do not work properly or are not present. SIGNS AND SYMPTOMS Symptoms of acute and chronic sinusitis are the same. The primary symptoms are pain and pressure around the affected sinuses. Other symptoms include:  Upper toothache.  Earache.  Headache.  Bad breath.  Decreased sense of smell and taste.  A cough, which worsens when you are lying  flat.  Fatigue.  Fever.  Thick drainage from your nose, which often is green and may contain pus (purulent).  Swelling and warmth over the affected sinuses. DIAGNOSIS Your health care provider will perform a physical exam. During your exam, your health care provider may perform any of the following to help determine if you have acute sinusitis or chronic sinusitis:  Look in your nose for signs of abnormal growths in your nostrils (nasal polyps).  Tap over the affected sinus to check for signs of infection.  View the inside of your sinuses using an imaging device that has a light attached (endoscope). If your health care provider suspects that you have chronic sinusitis, one or more of the following tests may be recommended:  Allergy tests.  Nasal culture. A sample of mucus is taken from your nose, sent to a lab, and screened for bacteria.  Nasal cytology. A sample of mucus is taken from your nose and examined by your health care provider to determine if your sinusitis is related to an allergy. TREATMENT Most cases of acute sinusitis are related to a viral infection and will resolve on their own within 10 days. Sometimes, medicines are prescribed to help relieve symptoms of both acute and chronic sinusitis. These may include pain medicines, decongestants, nasal steroid sprays, or saline sprays. However, for sinusitis related to a bacterial infection, your health care provider will prescribe antibiotic medicines. These are medicines that will help kill the bacteria causing the infection. Rarely, sinusitis is caused by a fungal infection. In these cases, your health care provider will prescribe antifungal medicine. For some cases of chronic sinusitis, surgery is needed. Generally, these are cases in which sinusitis recurs more than 3 times per year, despite other treatments. HOME CARE INSTRUCTIONS  Drink plenty of water.  Water helps thin the mucus so your sinuses can drain more  easily.  Use a humidifier.  Inhale steam 3-4 times a day (for example, sit in the bathroom with the shower running).  Apply a warm, moist washcloth to your face 3-4 times a day, or as directed by your health care provider.  Use saline nasal sprays to help moisten and clean your sinuses.  Take medicines only as directed by your health care provider.  If you were prescribed either an antibiotic or antifungal medicine, finish it all even if you start to feel better. SEEK IMMEDIATE MEDICAL CARE IF:  You have increasing pain or severe headaches.  You have nausea, vomiting, or drowsiness.  You have swelling around your face.  You have vision problems.  You have a stiff neck.  You have difficulty breathing.   This information is not intended to replace advice given to you by your health care provider. Make sure you discuss any questions you have with your health care provider.   Document Released: 09/22/2005 Document Revised: 10/13/2014 Document Reviewed: 10/07/2011 Elsevier Interactive Patient Education [employer]     I personally performed the services described in this documentation, which was scribed in my presence. The recorded information has been reviewed and considered, and addended by me as needed.    By signing my name below, I, Littie Deeds, attest that this documentation has been prepared under the direction and in the presence of Sheila Staggers, MD.  Electronically Signed: Littie Deeds, Medical Scribe. 08/16/2015. 9:10 AM.

## 2015-08-17 ENCOUNTER — Telehealth: Payer: Self-pay

## 2015-08-17 NOTE — Telephone Encounter (Signed)
Pt was seen on 11/10 by Dr. Neva SeatGreene for Dizziness - Primary. She would her work note extended to include today. She is not feeling any better-she is very nauseous.  She would like to RTW on 11/12. Please advise at [phone]

## 2015-08-17 NOTE — Telephone Encounter (Signed)
Please advise if ok to write work note for patient.

## 2015-08-17 NOTE — Telephone Encounter (Signed)
Ok for note to RTW 11/12. rtc if not improving in that time.

## 2015-08-18 NOTE — Telephone Encounter (Signed)
Advised note ready to pick up.

## 2015-10-02 ENCOUNTER — Inpatient Hospital Stay (HOSPITAL_COMMUNITY)
Admission: AD | Admit: 2015-10-02 | Discharge: 2015-10-02 | Disposition: A | Discharge status: Home / Self Care | Payer: Self-pay | Source: Ambulatory Visit | Attending: Obstetrics | Admitting: Obstetrics

## 2015-10-02 ENCOUNTER — Inpatient Hospital Stay (HOSPITAL_COMMUNITY): Payer: Self-pay

## 2015-10-02 ENCOUNTER — Encounter (HOSPITAL_COMMUNITY): Payer: Self-pay | Admitting: *Deleted

## 2015-10-02 DIAGNOSIS — Z3A01 Less than 8 weeks gestation of pregnancy: Secondary | ICD-10-CM | POA: Insufficient documentation

## 2015-10-02 DIAGNOSIS — Z87891 Personal history of nicotine dependence: Secondary | ICD-10-CM | POA: Insufficient documentation

## 2015-10-02 DIAGNOSIS — R110 Nausea: Secondary | ICD-10-CM | POA: Insufficient documentation

## 2015-10-02 DIAGNOSIS — R938 Abnormal findings on diagnostic imaging of other specified body structures: Secondary | ICD-10-CM | POA: Insufficient documentation

## 2015-10-02 DIAGNOSIS — F419 Anxiety disorder, unspecified: Secondary | ICD-10-CM | POA: Insufficient documentation

## 2015-10-02 DIAGNOSIS — Z880 Allergy status to penicillin: Secondary | ICD-10-CM | POA: Insufficient documentation

## 2015-10-02 DIAGNOSIS — N760 Acute vaginitis: Secondary | ICD-10-CM | POA: Insufficient documentation

## 2015-10-02 DIAGNOSIS — M549 Dorsalgia, unspecified: Secondary | ICD-10-CM | POA: Insufficient documentation

## 2015-10-02 DIAGNOSIS — O9989 Other specified diseases and conditions complicating pregnancy, childbirth and the puerperium: Secondary | ICD-10-CM | POA: Insufficient documentation

## 2015-10-02 DIAGNOSIS — R109 Unspecified abdominal pain: Secondary | ICD-10-CM | POA: Insufficient documentation

## 2015-10-02 DIAGNOSIS — B9689 Other specified bacterial agents as the cause of diseases classified elsewhere: Secondary | ICD-10-CM

## 2015-10-02 DIAGNOSIS — O26899 Other specified pregnancy related conditions, unspecified trimester: Secondary | ICD-10-CM

## 2015-10-02 DIAGNOSIS — O23591 Infection of other part of genital tract in pregnancy, first trimester: Secondary | ICD-10-CM

## 2015-10-02 DIAGNOSIS — F329 Major depressive disorder, single episode, unspecified: Secondary | ICD-10-CM | POA: Insufficient documentation

## 2015-10-02 DIAGNOSIS — A499 Bacterial infection, unspecified: Secondary | ICD-10-CM

## 2015-10-02 HISTORY — DX: Anxiety disorder, unspecified: F41.9

## 2015-10-02 LAB — URINALYSIS, ROUTINE W REFLEX MICROSCOPIC
BILIRUBIN URINE: NEGATIVE
GLUCOSE, UA: NEGATIVE mg/dL
KETONES UR: NEGATIVE mg/dL
LEUKOCYTES UA: NEGATIVE
NITRITE: NEGATIVE
PH: 6 (ref 5.0–8.0)
PROTEIN: NEGATIVE mg/dL
Specific Gravity, Urine: 1.025 (ref 1.005–1.030)

## 2015-10-02 LAB — URINE MICROSCOPIC-ADD ON

## 2015-10-02 LAB — ABO/RH: ABO/RH(D): O POS

## 2015-10-02 LAB — WET PREP, GENITAL
Sperm: NONE SEEN
Trich, Wet Prep: NONE SEEN
YEAST WET PREP: NONE SEEN

## 2015-10-02 LAB — POCT PREGNANCY, URINE: Preg Test, Ur: POSITIVE — AB

## 2015-10-02 LAB — HCG, QUANTITATIVE, PREGNANCY: HCG, BETA CHAIN, QUANT, S: 464 m[IU]/mL — AB (ref <5)

## 2015-10-02 MED ORDER — METRONIDAZOLE 500 MG PO TABS: 500.0000 mg | ORAL_TABLET | Freq: Two times a day (BID) | ORAL | Status: DC

## 2015-10-02 NOTE — MAU Note (Signed)
Having a lot of abd and back pain, off and on for the past week.   Had preg [facility].  Was unable to have abortion, because they could not see anything on [place]

## 2015-10-02 NOTE — MAU Provider Note (Signed)
History     CSN: [id number]  Arrival date and time: 10/02/15 1547   First Provider Initiated Contact with Patient 10/02/15 1845      Chief Complaint  Patient presents with  . Possible Pregnancy  . Abdominal Pain  . Back Pain  . Nausea   HPI Sheila Davis is a 34yo [id number] approx 4wk preg by LMP (09/02/15). Reports having a +UPT approx a week ago and has been having strong menstrual-like cramping ever since. Denies vag bldg. Was seen at [facility] this morning and they were unable to perform an abortion due to not being able to see a GS on U/S.   OB History    Gravida Para Term Preterm AB TAB SAB Ectopic Multiple Living   Past Medical History  Diagnosis Date  . Depression     pcp  . Seasonal allergies   . Anxiety     Past Surgical History  Procedure Laterality Date  . Cesarean section       2002 & 2006    Family History  Problem Relation Age of Onset  . Cancer - Ovarian Mother 51  . Diabetes Paternal Grandmother   . Heart disease Mother   . Hypertension Father     Social History  Substance Use Topics  . Smoking status: Former Smoker -- 0.20 packs/day for .5 years    Types: Cigarettes    Quit date: 09/02/2015  . Smokeless tobacco: Never Used  . Alcohol Use: 0.0 oz/week    0 Standard drinks or equivalent per week     Comment: occasionally    Allergies:  Allergies  Allergen Reactions  . Penicillins Rash    Has patient had a PCN reaction causing immediate rash, facial/tongue/throat swelling, SOB or lightheadedness with hypotension: no Has patient had a PCN reaction causing severe rash involving mucus membranes or skin necrosis: white puss Has patient had a PCN reaction that required hospitalization no Has patient had a PCN reaction occurring within the last 10 years: yes If all of the above answers are "NO", then may proceed with Cephalosporin use.     Prescriptions prior to admission  Medication Sig Dispense Refill Last Dose   . ibuprofen (ADVIL,MOTRIN) 800 MG tablet Take 800 mg by mouth every 8 (eight) hours as needed for moderate pain.   10/02/2015 at Unknown time  . amoxicillin-clavulanate (AUGMENTIN) 875-125 MG tablet Take 1 tablet by mouth 2 (two) times daily. (Patient not taking: Reported on 10/02/2015) 20 tablet 0   . fluticasone (FLONASE) 50 MCG/ACT nasal spray Place 2 sprays into both nostrils daily. (Patient not taking: Reported on 10/02/2015) 16 g 6     ROS Physical Exam   Blood pressure 110/73, pulse 99, temperature 98.2 F (36.8 C), temperature source Oral, resp. rate 18, height  (1.651 m), weight 103.42 kg (228 lb), last menstrual period 09/02/2015.  Physical Exam  Constitutional: She is oriented to person, place, and time. She appears well-developed.  HENT:  Head: Normocephalic.  Neck: Normal range of motion.  Cardiovascular: Normal rate.   Respiratory: Effort normal.  GI: Soft.  Genitourinary: Vagina normal.  SE: sm white vag d/c, no blood seen  Musculoskeletal: Normal range of motion.  Neurological: She is alert and oriented to person, place, and time.  Skin: Skin is warm and dry.  Psychiatric: She has a normal mood and affect. Her behavior is normal. Thought content normal.  Urinalysis    Component Value Date/Time   COLORURINE YELLOW 10/02/2015 1620   APPEARANCEUR CLEAR 10/02/2015 1620   LABSPEC 1.025 10/02/2015 1620   PHURINE 6.0 10/02/2015 1620   GLUCOSEU NEGATIVE 10/02/2015 1620   HGBUR TRACE* 10/02/2015 1620   BILIRUBINUR NEGATIVE 10/02/2015 1620   BILIRUBINUR small* 08/16/2015 1005   BILIRUBINUR neg 03/09/2015 1014   KETONESUR NEGATIVE 10/02/2015 1620   KETONESUR small (15)* 08/16/2015 1005   PROTEINUR NEGATIVE 10/02/2015 1620   PROTEINUR neg 03/09/2015 1014   UROBILINOGEN 0.2 08/16/2015 1005   UROBILINOGEN 0.2 01/06/2014 2010   NITRITE NEGATIVE 10/02/2015 1620   NITRITE Negative 08/16/2015 1005   NITRITE neg 03/09/2015 1014   LEUKOCYTESUR NEGATIVE 10/02/2015  1620   Micro: 6-30 SE, many bacteria  HCG: 464  Wet prep: present clue, mod WBC, many bacteria  Bld type: O+  U/S: probable CL cyst, thickened endometrium  MAU Course  Procedures  MDM UA, UPT, hCG, GC/chlam, ABO/Rh U/S  Assessment and Plan  Early preg Abd cramping BV  Return to MAU in 48 hrs for repeat bhCG Rx Flagyl 500 BID x 7d Come in sooner for increased pain/bldg Urine to culture  Cam HaiSHAW, KIMBERLY CNM 10/02/2015, 6:52 PM

## 2015-10-02 NOTE — Discharge Instructions (Signed)
Abdominal Pain During Pregnancy °Abdominal pain is common in pregnancy. Most of the time, it does not cause harm. There are many causes of abdominal pain. Some causes are more serious than others. Some of the causes of abdominal pain in pregnancy are easily diagnosed. Occasionally, the diagnosis takes time to understand. Other times, the cause is not determined. Abdominal pain can be a sign that something is very wrong with the pregnancy, or the pain may have nothing to do with the pregnancy at all. For this reason, always tell your health care provider if you have any abdominal discomfort. °HOME CARE INSTRUCTIONS  °Monitor your abdominal pain for any changes. The following actions may help to alleviate any discomfort you are experiencing: °· Do not have sexual intercourse or put anything in your vagina until your symptoms go away completely. °· Get plenty of rest until your pain improves. °· Drink clear fluids if you feel nauseous. Avoid solid food as long as you are uncomfortable or nauseous. °· Only take over-the-counter or prescription medicine as directed by your health care provider. °· Keep all follow-up appointments with your health care provider. °SEEK IMMEDIATE MEDICAL CARE IF: °· You are bleeding, leaking fluid, or passing tissue from the vagina. °· You have increasing pain or cramping. °· You have persistent vomiting. °· You have painful or bloody urination. °· You have a fever. °· You notice a decrease in your baby's movements. °· You have extreme weakness or feel faint. °· You have shortness of breath, with or without abdominal pain. °· You develop a severe headache with abdominal pain. °· You have abnormal vaginal discharge with abdominal pain. °· You have persistent diarrhea. °· You have abdominal pain that continues even after rest, or gets worse. °MAKE SURE YOU:  °· Understand these instructions. °· Will watch your condition. °· Will get help right away if you are not doing well or get worse. °    °This information is not intended to replace advice given to you by your health care provider. Make sure you discuss any questions you have with your health care provider. °  °Document Released: 09/22/2005 Document Revised: 07/13/2013 Document Reviewed: 04/21/2013 °Elsevier Interactive Patient Education ©2016 [employer] ° ° ° °Bacterial Vaginosis °Bacterial vaginosis is a vaginal infection that occurs when the normal balance of bacteria in the vagina is disrupted. It results from an overgrowth of certain bacteria. This is the most common vaginal infection in women of childbearing age. Treatment is important to prevent complications, especially in pregnant women, as it can cause a premature delivery. °CAUSES  °Bacterial vaginosis is caused by an increase in harmful bacteria that are normally present in smaller amounts in the vagina. Several different kinds of bacteria can cause bacterial vaginosis. However, the reason that the condition develops is not fully understood. °RISK FACTORS °Certain activities or behaviors can put you at an increased risk of developing bacterial vaginosis, including: °· Having a new sex partner or multiple sex partners. °· Douching. °· Using an intrauterine device (IUD) for contraception. °Women do not get bacterial vaginosis from toilet seats, bedding, swimming pools, or contact with objects around them. °SIGNS AND SYMPTOMS  °Some women with bacterial vaginosis have no signs or symptoms. Common symptoms include: °· Grey vaginal discharge. °· A fishlike odor with discharge, especially after sexual intercourse. °· Itching or burning of the vagina and vulva. °· Burning or pain with urination. °DIAGNOSIS  °Your health care provider will take a medical history and examine the vagina for signs   of bacterial vaginosis. A sample of vaginal fluid may be taken. Your health care provider will look at this sample under a microscope to check for bacteria and abnormal cells. A vaginal pH test may  also be done.  °TREATMENT  °Bacterial vaginosis may be treated with antibiotic medicines. These may be given in the form of a pill or a vaginal cream. A second round of antibiotics may be prescribed if the condition comes back after treatment. Because bacterial vaginosis increases your risk for sexually transmitted diseases, getting treated can help reduce your risk for chlamydia, gonorrhea, HIV, and herpes. °HOME CARE INSTRUCTIONS  °· Only take over-the-counter or prescription medicines as directed by your health care provider. °· If antibiotic medicine was prescribed, take it as directed. Make sure you finish it even if you start to feel better. °· Tell all sexual partners that you have a vaginal infection. They should see their health care provider and be treated if they have problems, such as a mild rash or itching. °· During treatment, it is important that you follow these instructions: °¨ Avoid sexual activity or use condoms correctly. °¨ Do not douche. °¨ Avoid alcohol as directed by your health care provider. °¨ Avoid breastfeeding as directed by your health care provider. °SEEK MEDICAL CARE IF:  °· Your symptoms are not improving after 3 days of treatment. °· You have increased discharge or pain. °· You have a fever. °MAKE SURE YOU:  °· Understand these instructions. °· Will watch your condition. °· Will get help right away if you are not doing well or get worse. °FOR MORE INFORMATION  °Centers for Disease Control and Prevention, Division of STD Prevention: www.cdc.gov/std °American Sexual Health Association (ASHA): www.ashastd.org  °  °This information is not intended to replace advice given to you by your health care provider. Make sure you discuss any questions you have with your health care provider. °  °Document Released: 09/22/2005 Document Revised: 10/13/2014 Document Reviewed: 05/04/2013 °Elsevier Interactive Patient Education ©2016 Elsevier Inc. ° °

## 2015-10-04 LAB — CULTURE, OB URINE

## 2015-10-06 LAB — GC/CHLAMYDIA PROBE AMP (~~LOC~~) NOT AT ARMC
Chlamydia: NEGATIVE
Neisseria Gonorrhea: NEGATIVE

## 2016-02-28 ENCOUNTER — Ambulatory Visit: Payer: No Typology Code available for payment source | Admitting: Certified Nurse Midwife

## 2016-03-04 ENCOUNTER — Ambulatory Visit: Payer: No Typology Code available for payment source | Admitting: Certified Nurse Midwife

## 2016-03-06 ENCOUNTER — Ambulatory Visit: Payer: No Typology Code available for payment source | Admitting: Certified Nurse Midwife

## 2016-03-14 ENCOUNTER — Ambulatory Visit: Payer: No Typology Code available for payment source | Admitting: Certified Nurse Midwife

## 2016-03-27 ENCOUNTER — Encounter: Payer: Self-pay | Admitting: Certified Nurse Midwife

## 2016-03-27 ENCOUNTER — Ambulatory Visit (INDEPENDENT_AMBULATORY_CARE_PROVIDER_SITE_OTHER): Payer: BLUE CROSS/BLUE SHIELD | Admitting: Certified Nurse Midwife

## 2016-03-27 ENCOUNTER — Encounter: Payer: Self-pay | Admitting: *Deleted

## 2016-03-27 VITALS — BP 111/79 | HR 82 | Wt 229.0 lb

## 2016-03-27 DIAGNOSIS — Z1389 Encounter for screening for other disorder: Secondary | ICD-10-CM

## 2016-03-27 DIAGNOSIS — Z113 Encounter for screening for infections with a predominantly sexual mode of transmission: Secondary | ICD-10-CM

## 2016-03-27 DIAGNOSIS — Z8759 Personal history of other complications of pregnancy, childbirth and the puerperium: Secondary | ICD-10-CM

## 2016-03-27 DIAGNOSIS — Z3041 Encounter for surveillance of contraceptive pills: Secondary | ICD-10-CM

## 2016-03-27 DIAGNOSIS — Z01419 Encounter for gynecological examination (general) (routine) without abnormal findings: Secondary | ICD-10-CM | POA: Diagnosis not present

## 2016-03-27 LAB — POCT URINALYSIS DIPSTICK
Bilirubin, UA: NEGATIVE
Blood, UA: 50
GLUCOSE UA: NEGATIVE
KETONES UA: NEGATIVE
Leukocytes, UA: NEGATIVE
NITRITE UA: NEGATIVE
PROTEIN UA: NEGATIVE
SPEC GRAV UA: 1.015
UROBILINOGEN UA: NEGATIVE
pH, UA: 5.5

## 2016-03-27 MED ORDER — NORGESTIM-ETH ESTRAD TRIPHASIC 0.18/0.215/0.25 MG-35 MCG PO TABS: 1.0000 | ORAL_TABLET | Freq: Every day | ORAL | Status: DC

## 2016-03-27 NOTE — Progress Notes (Signed)
Patient ID: Sheila Davis, female   DOB: November 21, 1980, 35 y.o.   MRN: [id number]   Subjective:        Sheila Davis is a 35 y.o. female here for a routine exam.  Current complaints: Feels like she has UTI, BV or yeast  Infection.  Denies dysuria, vaginal discharge.  Reports urinary urgency, occasional itching, strong odor to urine, dark yellow urine.  Milky white vaginal discharge after sexual intercourse, without odor.  Requested full STD screening.  Mother, ovarian CA (deceased).  Maternal aunt & uncle CA hx (Deceased).  Personal health questionnaire:  Is patient [name], have a family history of breast and/or ovarian cancer: no Is there a family history of uterine cancer diagnosed at age < 3950, gastrointestinal cancer, urinary tract cancer, family member who is a Personnel officerLynch syndrome-associated carrier: no Is the patient overweight and hypertensive, family history of diabetes, personal history of gestational diabetes, preeclampsia or PCOS: no Is patient over 3455, have PCOS,  family history of premature CHD under age 35, diabetes, smoke, have hypertension or peripheral artery disease:  no At any time, has a partner hit, kicked or otherwise hurt or frightened you?: no Over the past 2 weeks, have you felt down, depressed or hopeless?: no Over the past 2 weeks, have you felt little interest or pleasure in doing things?:no   Gynecologic History Patient's last menstrual period was 03/03/2016. Contraception: OCP (estrogen/progesterone) Last Pap: 12/2014. Results were: normal Last mammogram: N/A. Results were: N/A  Obstetric History OB History  Gravida Para Term Preterm AB SAB TAB Ectopic Multiple Living  5 2 2  2 1 1   2     # Outcome Date GA Lbr Len/2nd Weight Sex Delivery Anes PTL Lv  5 Gravida           4 SAB 06/03/13 [id number]         3 Term 10/02/05 [id number]  7 lb (3.175 kg)  CS-Classical     2 Term 09/01/01 [id number] 24:00 8 lb 12 oz (3.969 kg) M CS-Classical     1 TAB               Past  Medical History  Diagnosis Date  . Depression     pcp  . Seasonal allergies   . Anxiety     Past Surgical History  Procedure Laterality Date  . Cesarean section       2002 & 2006     Current outpatient prescriptions:  .  Norgestimate-Ethinyl Estradiol Triphasic (TRI-SPRINTEC) 0.18/0.215/0.25 MG-35 MCG tablet, Take 1 tablet by mouth daily., Disp: 3 Package, Rfl: 4 .  ibuprofen (ADVIL,MOTRIN) 800 MG tablet, Take 800 mg by mouth every 8 (eight) hours as needed for moderate pain. Reported on 03/27/2016, Disp: , Rfl:  Allergies  Allergen Reactions  . Penicillins Rash    Has patient had a PCN reaction causing immediate rash, facial/tongue/throat swelling, SOB or lightheadedness with hypotension: no Has patient had a PCN reaction causing severe rash involving mucus membranes or skin necrosis: white puss Has patient had a PCN reaction that required hospitalization no Has patient had a PCN reaction occurring within the last 10 years: yes If all of the above answers are "NO", then may proceed with Cephalosporin use.     Social History  Substance Use Topics  . Smoking status: Former Smoker -- 0.20 packs/day for .5 years    Types: Cigarettes    Quit date: 09/02/2015  . Smokeless tobacco: Never Used  . Alcohol Use: 0.0 oz/week  0 Standard drinks or equivalent per week     Comment: occasionally    Family History  Problem Relation Age of Onset  . Cancer - Ovarian Mother [id number]  . Diabetes Paternal Grandmother   . Heart disease Mother   . Hypertension Father       Review of Systems  Constitutional: negative for fatigue and weight loss Respiratory: negative for cough and wheezing Cardiovascular: negative for chest pain, fatigue and palpitations Gastrointestinal: negative for abdominal pain and change in bowel habits Musculoskeletal:negative for myalgias Neurological: negative for gait problems and tremors Behavioral/Psych: negative for abusive relationship, depression Endocrine:  negative for temperature intolerance   Genitourinary:negative for abnormal menstrual periods, genital lesions, hot flashes, sexual problems.  Positive for vaginal discharge after sexual intercourse, urinary urgency, dark urine with strong odor, occasional itching. Integument/breast: negative for breast lump, breast tenderness, nipple discharge and skin lesion(s)    Objective:       BP 111/79 mmHg  Pulse 82  Wt 229 lb (103.874 kg)  LMP 03/03/2016  Breastfeeding? Unknown General:   alert  Skin:   no rash or abnormalities  Lungs:   clear to auscultation bilaterally  Heart:   regular rate and rhythm, S1, S2 normal, no murmur, click, rub or gallop  Breasts:   normal without suspicious masses, skin or nipple changes or axillary nodes  Abdomen:  normal findings: no organomegaly, soft and no hernia Abnormal: Bladder TTP  Pelvis:  External genitalia: normal general appearance Urinary system: urethral meatus normal and bladder without fullness, nontender Vaginal: normal without tenderness, induration or masses Cervix: normal appearance Adnexa: normal bimanual exam Uterus: anteverted and non-tender, normal size   Lab Review Urine pregnancy test Labs reviewed yes Radiologic studies reviewed yes  50% of 30 min visit spent on counseling and coordination of care.   Assessment:    Healthy female exam.    STD screening.  Contraception management. Plan:    Education reviewed: depression evaluation, low fat, low cholesterol diet, safe sex/STD prevention, self breast exams and weight bearing exercise. Contraception: OCP (estrogen/progesterone). Follow up in: 1 year.   Meds ordered this encounter  Medications  . DISCONTD: Norgestimate-Ethinyl Estradiol Triphasic (TRI-SPRINTEC) 0.18/0.215/0.25 MG-35 MCG tablet    Sig: Take 1 tablet by mouth daily.  . Norgestimate-Ethinyl Estradiol Triphasic (TRI-SPRINTEC) 0.18/0.215/0.25 MG-35 MCG tablet    Sig: Take 1 tablet by mouth daily.     Dispense:  3 Package    Refill:  4   Orders Placed This Encounter  Procedures  . Hepatitis B surface antigen  . RPR  . Hepatitis C antibody  . HIV antibody  . POCT urinalysis dipstick   Need to obtain previous records Possible management options include: possible change in contraception if patient desires, healthy diet/exercise. Follow up as needed.

## 2016-03-28 DIAGNOSIS — Z8759 Personal history of other complications of pregnancy, childbirth and the puerperium: Secondary | ICD-10-CM | POA: Insufficient documentation

## 2016-03-28 LAB — RPR: RPR Ser Ql: NONREACTIVE

## 2016-03-28 LAB — HEPATITIS C ANTIBODY: Hep C Virus Ab: <0.1 s/co ratio (ref 0.0–0.9)

## 2016-03-28 LAB — HEPATITIS B SURFACE ANTIGEN: HEP B S AG: NEGATIVE

## 2016-03-28 LAB — HIV ANTIBODY (ROUTINE TESTING W REFLEX): HIV SCREEN 4TH GENERATION: NONREACTIVE

## 2016-03-31 LAB — NUSWAB VG+, CANDIDA 6SP
CANDIDA ALBICANS, NAA: NEGATIVE
CHLAMYDIA TRACHOMATIS, NAA: NEGATIVE
Candida glabrata, NAA: NEGATIVE
Candida krusei, NAA: NEGATIVE
Candida lusitaniae, NAA: NEGATIVE
Candida parapsilosis, NAA: NEGATIVE
Candida tropicalis, NAA: NEGATIVE
NEISSERIA GONORRHOEAE, NAA: NEGATIVE
TRICH VAG BY NAA: NEGATIVE

## 2016-03-31 LAB — PAP IG AND HPV HIGH-RISK
HPV, HIGH-RISK: NEGATIVE
PAP SMEAR COMMENT: 0

## 2017-04-12 ENCOUNTER — Other Ambulatory Visit: Payer: Self-pay | Admitting: Certified Nurse Midwife

## 2017-04-12 DIAGNOSIS — Z3041 Encounter for surveillance of contraceptive pills: Secondary | ICD-10-CM

## 2017-04-12 DIAGNOSIS — Z113 Encounter for screening for infections with a predominantly sexual mode of transmission: Secondary | ICD-10-CM

## 2017-04-12 DIAGNOSIS — Z01419 Encounter for gynecological examination (general) (routine) without abnormal findings: Secondary | ICD-10-CM

## 2017-11-16 DIAGNOSIS — J019 Acute sinusitis, unspecified: Secondary | ICD-10-CM | POA: Insufficient documentation

## 2017-11-19 ENCOUNTER — Encounter: Payer: Self-pay | Admitting: Certified Nurse Midwife

## 2017-11-19 ENCOUNTER — Ambulatory Visit (INDEPENDENT_AMBULATORY_CARE_PROVIDER_SITE_OTHER): Payer: BLUE CROSS/BLUE SHIELD | Admitting: Certified Nurse Midwife

## 2017-11-19 ENCOUNTER — Other Ambulatory Visit: Payer: Self-pay

## 2017-11-19 VITALS — BP 113/78 | HR 89 | Wt 252.5 lb

## 2017-11-19 DIAGNOSIS — N898 Other specified noninflammatory disorders of vagina: Secondary | ICD-10-CM

## 2017-11-19 DIAGNOSIS — B373 Candidiasis of vulva and vagina: Secondary | ICD-10-CM

## 2017-11-19 DIAGNOSIS — Z01411 Encounter for gynecological examination (general) (routine) with abnormal findings: Secondary | ICD-10-CM | POA: Diagnosis not present

## 2017-11-19 DIAGNOSIS — F329 Major depressive disorder, single episode, unspecified: Secondary | ICD-10-CM

## 2017-11-19 DIAGNOSIS — N760 Acute vaginitis: Secondary | ICD-10-CM | POA: Diagnosis not present

## 2017-11-19 DIAGNOSIS — B3731 Acute candidiasis of vulva and vagina: Secondary | ICD-10-CM

## 2017-11-19 DIAGNOSIS — Z113 Encounter for screening for infections with a predominantly sexual mode of transmission: Secondary | ICD-10-CM

## 2017-11-19 DIAGNOSIS — Z1151 Encounter for screening for human papillomavirus (HPV): Secondary | ICD-10-CM

## 2017-11-19 DIAGNOSIS — Z01419 Encounter for gynecological examination (general) (routine) without abnormal findings: Secondary | ICD-10-CM

## 2017-11-19 DIAGNOSIS — Z30011 Encounter for initial prescription of contraceptive pills: Secondary | ICD-10-CM | POA: Diagnosis not present

## 2017-11-19 DIAGNOSIS — Z124 Encounter for screening for malignant neoplasm of cervix: Secondary | ICD-10-CM | POA: Diagnosis not present

## 2017-11-19 DIAGNOSIS — F32A Depression, unspecified: Secondary | ICD-10-CM

## 2017-11-19 MED ORDER — BUPROPION HCL 100 MG PO TABS: 100.0000 mg | ORAL_TABLET | Freq: Two times a day (BID) | ORAL | 12 refills | Status: DC

## 2017-11-19 MED ORDER — NORETHIN-ETH ESTRAD-FE BIPHAS 1 MG-10 MCG / 10 MCG PO TABS: 1.0000 | ORAL_TABLET | Freq: Every day | ORAL | 4 refills | Status: DC

## 2017-11-19 MED ORDER — TERCONAZOLE 0.8 % VA CREA
1.0000 | TOPICAL_CREAM | Freq: Every day | VAGINAL | 0 refills | Status: DC
Start: 2017-11-19 — End: 2019-10-04

## 2017-11-19 MED ORDER — FLUCONAZOLE 200 MG PO TABS: 200.0000 mg | ORAL_TABLET | Freq: Once | ORAL | 0 refills | Status: AC

## 2017-11-19 NOTE — Progress Notes (Signed)
Presents for AEX/PAP/cultures. [facility]  PHQ-9=13

## 2017-11-19 NOTE — Progress Notes (Signed)
Subjective:        Sheila Davis is a 37 y.o. female here for a routine exam.  Current complaints: vaginal discharge, depression.  Reports normal periods on OCPs.  States stopped taking OCPs two months ago d/t side effects.  Reports new female sexual partner.  Desires full STD screening exam.  Declines genetic testing today.  Does not exercise.  Discussed OTC probiotics, change in diet, adding in exercise.    Reports breast tenderness, has not had a recent bra fitting; bra fitting encouraged.  Discussed breast reduction surgery d/t back pain: declines currently and would like to loose weight first.  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  Worried about past history of ADD.  Encouraged to reach out to primary care for management of general health concerns.    Mother, ovarian CA (deceased).  Maternal aunt & uncle CA hx (Deceased).  Personal health questionnaire:  Is patient [name], have a family history of breast and/or ovarian cancer: yes Is there a family history of uterine cancer diagnosed at age < 43, gastrointestinal cancer, urinary tract cancer, family member who is a Personnel officer syndrome-associated carrier: no Is the patient overweight and hypertensive, family history of diabetes, personal history of gestational diabetes, preeclampsia or PCOS: yes Is patient over 97, have PCOS,  family history of premature CHD under age 43, diabetes, smoke, have hypertension or peripheral artery disease:  no At any time, has a partner hit, kicked or otherwise hurt or frightened you?: no Over the past 2 weeks, have you felt down, depressed or hopeless?: no Over the past 2 weeks, have you felt little interest or pleasure in doing things?:no  Gynecologic History Patient's last menstrual period was 10/25/2017. Contraception: OCP (estrogen/progesterone) Last Pap: 03/27/16. Results were: normal Last mammogram: n/a <40 years, no significant family hx.   Obstetric History OB History  Gravida Para  Term Preterm AB Living  5 2 2   2 2   SAB TAB Ectopic Multiple Live Births  1 1          # Outcome Date GA Lbr Len/2nd Weight Sex Delivery Anes PTL Lv  5 Gravida           4 SAB 06/03/13 [redacted]w[redacted]d         3 Term 10/02/05 [redacted]w[redacted]d  7 lb (3.175 kg)  CS-Classical     2 Term 09/01/01 [redacted]w[redacted]d 24:00 8 lb 12 oz (3.969 kg) M CS-Classical     1 TAB               Past Medical History:  Diagnosis Date  . Anxiety   . Depression    pcp  . Seasonal allergies     Past Surgical History:  Procedure Laterality Date  . CESAREAN SECTION      2002 & 2006     Current Outpatient Medications:  .  buPROPion (WELLBUTRIN) 100 MG tablet, Take 1 tablet (100 mg total) by mouth 2 (two) times daily., Disp: 30 tablet, Rfl: 12 .  fluconazole (DIFLUCAN) 200 MG tablet, Take 1 tablet (200 mg total) by mouth once for 1 dose. Repeat dose in 48-72 hours., Disp: 3 tablet, Rfl: 0 .  ibuprofen (ADVIL,MOTRIN) 800 MG tablet, Take 800 mg by mouth every 8 (eight) hours as needed for moderate pain. Reported on 03/27/2016, Disp: , Rfl:  .  Norethindrone-Ethinyl Estradiol-Fe Biphas (LO LOESTRIN FE) 1 MG-10 MCG / 10 MCG tablet, Take 1 tablet by mouth daily. Take 1 tablet by mouth daily., Disp: 3  Package, Rfl: 4 .  terconazole (TERAZOL 3) 0.8 % vaginal cream, Place 1 applicator vaginally at bedtime., Disp: 20 g, Rfl: 0 .  TRI-SPRINTEC 0.18/0.215/0.25 MG-35 MCG tablet, TAKE ONE TABLET BY MOUTH ONCE DAILY (Patient not taking: Reported on 11/19/2017), Disp: 28 tablet, Rfl: 11 Allergies  Allergen Reactions  . Penicillins Rash    Has patient had a PCN reaction causing immediate rash, facial/tongue/throat swelling, SOB or lightheadedness with hypotension: no Has patient had a PCN reaction causing severe rash involving mucus membranes or skin necrosis: white puss Has patient had a PCN reaction that required hospitalization no Has patient had a PCN reaction occurring within the last 10 years: yes If all of the above answers are "NO", then may  proceed with Cephalosporin use.     Social History   Tobacco Use  . Smoking status: Former Smoker    Packs/day: 0.20    Years: 0.50    Pack years: 0.10    Types: Cigarettes    Last attempt to quit: 09/02/2015    Years since quitting: 2.2  . Smokeless tobacco: Never Used  Substance Use Topics  . Alcohol use: Yes    Alcohol/week: 0.0 oz    Comment: occasionally    Family History  Problem Relation Age of Onset  . Cancer - Ovarian Mother 12  . Diabetes Paternal Grandmother   . Heart disease Mother   . Hypertension Father       Review of Systems  Constitutional: negative for fatigue and weight loss Respiratory: negative for cough and wheezing Cardiovascular: negative for chest pain, fatigue and palpitations Gastrointestinal: negative for abdominal pain and change in bowel habits Musculoskeletal:negative for myalgias Neurological: negative for gait problems and tremors Behavioral/Psych: negative for abusive relationship, + for depression Endocrine: negative for temperature intolerance    Genitourinary:negative for abnormal menstrual periods, genital lesions, hot flashes, sexual problems and + for vaginal discharge Integument/breast: negative for breast lump, breast tenderness, nipple discharge and skin lesion(s)    Objective:       BP 113/78   Pulse 89   Wt 252 lb 8 oz (114.5 kg)   LMP 10/25/2017   Breastfeeding? No   BMI 42.02 kg/m  General:   alert  Skin:   no rash or abnormalities  Lungs:   clear to auscultation bilaterally  Heart:   regular rate and rhythm, S1, S2 normal, no murmur, click, rub or gallop  Breasts:   normal without suspicious masses, skin or nipple changes or axillary nodes  Abdomen:  normal findings: no organomegaly, soft, non-tender and no hernia  Pelvis:  External genitalia: normal general appearance Urinary system: urethral meatus normal and bladder without fullness, nontender Vaginal: normal without tenderness, induration or  masses Cervix: normal appearance, thick chunky discharge Adnexa: normal bimanual exam Uterus: anteverted and non-tender, normal size   Lab Review Urine pregnancy test Labs reviewed yes Radiologic studies reviewed no  50% of 45 min visit spent on counseling and coordination of care.   Assessment & Plan    Healthy female exam.    1. Well woman exam    - Cytology - PAP  2. Yeast infection of the vagina    - fluconazole (DIFLUCAN) 200 MG tablet; Take 1 tablet (200 mg total) by mouth once for 1 dose. Repeat dose in 48-72 hours.  Dispense: 3 tablet; Refill: 0 - terconazole (TERAZOL 3) 0.8 % vaginal cream; Place 1 applicator vaginally at bedtime.  Dispense: 20 g; Refill: 0  3. Depression, unspecified depression type    -  buPROPion (WELLBUTRIN) 100 MG tablet; Take 1 tablet (100 mg total) by mouth 2 (two) times daily.  Dispense: 30 tablet; Refill: 12 - Ambulatory referral to Integrated [facility]  4. Encounter for initial prescription of contraceptive pills    - Norethindrone-Ethinyl Estradiol-Fe Biphas (LO LOESTRIN FE) 1 MG-10 MCG / 10 MCG tablet; Take 1 tablet by mouth daily. Take 1 tablet by mouth daily.  Dispense: 3 Package; Refill: 4  5. Screen for STD (sexually transmitted disease)    - Cervicovaginal ancillary only - HIV antibody (with reflex) - RPR - Hepatitis C antibody - Hepatitis B surface antigen  6. Vaginal discharge    - Cervicovaginal ancillary only  7. Morbid obesity (HCC)    - Hemoglobin A1c   Education reviewed: calcium supplements, depression evaluation, low fat, low cholesterol diet, safe sex/STD prevention, self breast exams, skin cancer screening and weight bearing exercise. Contraception: OCP (estrogen/progesterone). Follow up in: 1 year.   Meds ordered this encounter  Medications  . Norethindrone-Ethinyl Estradiol-Fe Biphas (LO LOESTRIN FE) 1 MG-10 MCG / 10 MCG tablet    Sig: Take 1 tablet by mouth daily. Take 1 tablet by mouth daily.     Dispense:  3 Package    Refill:  4    BIN # [id number], PCN# [id number], [id number], ID#: [id number]  . buPROPion (WELLBUTRIN) 100 MG tablet    Sig: Take 1 tablet (100 mg total) by mouth 2 (two) times daily.    Dispense:  30 tablet    Refill:  12  . fluconazole (DIFLUCAN) 200 MG tablet    Sig: Take 1 tablet (200 mg total) by mouth once for 1 dose. Repeat dose in 48-72 hours.    Dispense:  3 tablet    Refill:  0  . terconazole (TERAZOL 3) 0.8 % vaginal cream    Sig: Place 1 applicator vaginally at bedtime.    Dispense:  20 g    Refill:  0   Orders Placed This Encounter  Procedures  . HIV antibody (with reflex)  . RPR  . Hepatitis C antibody  . Hepatitis B surface antigen  . Hemoglobin A1c  . Ambulatory referral to Integrated [facility]    Referral Priority:   Routine    Referral Type:   Consultation    Referral Reason:   Specialty Services Required    Number of Visits Requested:   1    Possible management options include: Nuva Ring Follow up as needed.

## 2017-11-20 LAB — HEMOGLOBIN A1C
Est. average glucose Bld gHb Est-mCnc: 117 mg/dL
HEMOGLOBIN A1C: 5.7 % — AB (ref 4.8–5.6)

## 2017-11-20 LAB — CYTOLOGY - PAP
DIAGNOSIS: NEGATIVE
HPV (WINDOPATH): NOT DETECTED

## 2017-11-20 LAB — HEPATITIS C ANTIBODY: Hep C Virus Ab: <0.1 s/co ratio (ref 0.0–0.9)

## 2017-11-20 LAB — RPR: RPR: NONREACTIVE

## 2017-11-20 LAB — HEPATITIS B SURFACE ANTIGEN: HEP B S AG: NEGATIVE

## 2017-11-20 LAB — CERVICOVAGINAL ANCILLARY ONLY
Bacterial vaginitis: NEGATIVE
CANDIDA VAGINITIS: NEGATIVE
Chlamydia: NEGATIVE
Neisseria Gonorrhea: NEGATIVE
TRICH (WINDOWPATH): NEGATIVE

## 2017-11-20 LAB — HIV ANTIBODY (ROUTINE TESTING W REFLEX): HIV SCREEN 4TH GENERATION: NONREACTIVE

## 2017-11-21 ENCOUNTER — Other Ambulatory Visit: Payer: Self-pay | Admitting: Certified Nurse Midwife

## 2017-11-21 DIAGNOSIS — R7303 Prediabetes: Secondary | ICD-10-CM | POA: Insufficient documentation

## 2018-04-23 ENCOUNTER — Other Ambulatory Visit: Payer: Self-pay | Admitting: Obstetrics

## 2018-04-23 DIAGNOSIS — Z3041 Encounter for surveillance of contraceptive pills: Secondary | ICD-10-CM

## 2018-04-23 DIAGNOSIS — Z01419 Encounter for gynecological examination (general) (routine) without abnormal findings: Secondary | ICD-10-CM

## 2018-04-23 DIAGNOSIS — Z113 Encounter for screening for infections with a predominantly sexual mode of transmission: Secondary | ICD-10-CM

## 2018-09-13 ENCOUNTER — Ambulatory Visit: Payer: BLUE CROSS/BLUE SHIELD | Admitting: Obstetrics and Gynecology

## 2018-09-20 ENCOUNTER — Ambulatory Visit: Payer: BLUE CROSS/BLUE SHIELD | Admitting: Obstetrics and Gynecology

## 2019-05-24 ENCOUNTER — Ambulatory Visit (INDEPENDENT_AMBULATORY_CARE_PROVIDER_SITE_OTHER): Payer: Medicaid Other

## 2019-05-24 DIAGNOSIS — Z3480 Encounter for supervision of other normal pregnancy, unspecified trimester: Secondary | ICD-10-CM | POA: Insufficient documentation

## 2019-05-24 MED ORDER — DOXYLAMINE-PYRIDOXINE 10-10 MG PO TBEC: 2.0000 | DELAYED_RELEASE_TABLET | Freq: Every day | ORAL | 5 refills | Status: DC

## 2019-05-24 NOTE — Progress Notes (Signed)
Patient seen and assessed by nursing staff during this encounter. I have reviewed the chart and agree with the documentation and plan.  Aquanetta Schwarz, MD 05/24/2019 2:10 PM    

## 2019-05-24 NOTE — Progress Notes (Signed)
Pt is on the phone for new OB nurse interview. Pregnancy confirmed at pregnancy care center, pt reports she had a positive UPT and ultrasound done there. I advised patient to have those records faxed to our office prior to New OB appt, pt verbalized understanding. LMP 02/06/2019, EDD 11/13/19. Pt c/o nausea and vomiting. Advised patient I will send her diclegis for nausea, advised her to call back if symptoms do not improve.

## 2019-05-31 ENCOUNTER — Other Ambulatory Visit: Payer: Self-pay

## 2019-05-31 ENCOUNTER — Ambulatory Visit (INDEPENDENT_AMBULATORY_CARE_PROVIDER_SITE_OTHER): Payer: Medicaid Other | Admitting: Advanced Practice Midwife

## 2019-05-31 ENCOUNTER — Other Ambulatory Visit (HOSPITAL_COMMUNITY)
Admission: RE | Admit: 2019-05-31 | Discharge: 2019-05-31 | Disposition: A | Discharge status: Home / Self Care | Payer: Medicaid Other | Source: Ambulatory Visit | Attending: Advanced Practice Midwife | Admitting: Advanced Practice Midwife

## 2019-05-31 VITALS — BP 126/84 | HR 101 | Wt 264.2 lb

## 2019-05-31 DIAGNOSIS — Z3A16 16 weeks gestation of pregnancy: Secondary | ICD-10-CM

## 2019-05-31 DIAGNOSIS — Z3480 Encounter for supervision of other normal pregnancy, unspecified trimester: Secondary | ICD-10-CM | POA: Diagnosis present

## 2019-05-31 DIAGNOSIS — R519 Headache, unspecified: Secondary | ICD-10-CM | POA: Insufficient documentation

## 2019-05-31 DIAGNOSIS — O219 Vomiting of pregnancy, unspecified: Secondary | ICD-10-CM | POA: Insufficient documentation

## 2019-05-31 DIAGNOSIS — O09522 Supervision of elderly multigravida, second trimester: Secondary | ICD-10-CM

## 2019-05-31 DIAGNOSIS — R51 Headache: Secondary | ICD-10-CM

## 2019-05-31 DIAGNOSIS — O26892 Other specified pregnancy related conditions, second trimester: Secondary | ICD-10-CM

## 2019-05-31 DIAGNOSIS — K469 Unspecified abdominal hernia without obstruction or gangrene: Secondary | ICD-10-CM

## 2019-05-31 DIAGNOSIS — Z3481 Encounter for supervision of other normal pregnancy, first trimester: Secondary | ICD-10-CM

## 2019-05-31 DIAGNOSIS — B372 Candidiasis of skin and nail: Secondary | ICD-10-CM

## 2019-05-31 DIAGNOSIS — Z98891 History of uterine scar from previous surgery: Secondary | ICD-10-CM

## 2019-05-31 MED ORDER — BUTALBITAL-APAP-CAFFEINE 50-325-40 MG PO TABS: 1.0000 | ORAL_TABLET | Freq: Four times a day (QID) | ORAL | 0 refills | Status: AC | PRN

## 2019-05-31 MED ORDER — NYSTATIN-TRIAMCINOLONE 100000-0.1 UNIT/GM-% EX OINT: 1.0000 "application " | TOPICAL_OINTMENT | Freq: Two times a day (BID) | CUTANEOUS | 0 refills | Status: DC

## 2019-05-31 MED ORDER — CONCEPT OB 130-92.4-1 MG PO CAPS: 1.0000 | ORAL_CAPSULE | Freq: Every day | ORAL | 11 refills | Status: DC

## 2019-05-31 MED ORDER — ONDANSETRON HCL 4 MG PO TABS: 4.0000 mg | ORAL_TABLET | Freq: Three times a day (TID) | ORAL | 0 refills | Status: DC | PRN

## 2019-05-31 MED ORDER — BLOOD PRESSURE CUFF MISC: 1.0000 | 0 refills | Status: DC

## 2019-05-31 MED ORDER — PROMETHAZINE HCL 25 MG PO TABS: 12.5000 mg | ORAL_TABLET | Freq: Four times a day (QID) | ORAL | 3 refills | Status: DC | PRN

## 2019-05-31 NOTE — Progress Notes (Signed)
Pt is her for new OB appt. LMP 02/06/2019, EDD 11/13/19. Last pap 11/19/17 normal.

## 2019-05-31 NOTE — Progress Notes (Signed)
Subjective:   Sheila Davis is a 38 y.o. [id number] at [redacted]w[redacted]d by LMP c/w early [place] at pregnancy care center being seen today for her first obstetrical visit.  Her obstetrical history is significant for C/S x 2 at term and has Prediabetes; Supervision of other normal pregnancy, antepartum; History of 2 cesarean sections; Multigravida of advanced maternal age in second trimester; Abdominal hernia without obstruction and without gangrene; Headache in pregnancy, antepartum, second trimester; and Nausea and vomiting during pregnancy prior to [redacted] weeks gestation on their problem list.. Patient does intend to breast feed. Pregnancy history fully reviewed.  Patient reports nausea and low appetite, frequent h/a, rash between buttocks.  HISTORY: OB History  Gravida Para Term Preterm AB Living  5 2 2  0 2 2  SAB TAB Ectopic Multiple Live Births  2 0 0 0 0    # Outcome Date GA Lbr Len/2nd Weight Sex Delivery Anes PTL Lv  5 Current           4 SAB 2017          3 SAB 06/03/13 [redacted]w[redacted]d         2 Term 10/02/05 [redacted]w[redacted]d  7 lb (3.175 kg)  CS-Classical     1 Term 09/01/01 [redacted]w[redacted]d 24:00 8 lb 12 oz (3.969 kg) M CS-Classical        Complications: Polyhydramnios   Past Medical History:  Diagnosis Date  . Anxiety   . Depression    pcp  . History of stomach ulcers   . Migraines   . Seasonal allergies    Past Surgical History:  Procedure Laterality Date  . CESAREAN SECTION      2002 & 2006   Family History  Problem Relation Age of Onset  . Cancer - Ovarian Mother 80  . Heart disease Mother   . Cancer Mother   . Hypertension Father   . Diabetes Paternal Grandmother    Social History   Tobacco Use  . Smoking status: Former Smoker    Packs/day: 0.20    Years: 0.50    Pack years: 0.10    Types: Cigarettes    Quit date: 09/02/2015    Years since quitting: 3.7  . Smokeless tobacco: Never Used  Substance Use Topics  . Alcohol use: Yes    Alcohol/week: 0.0 standard drinks    Comment: occasionally  .  Drug use: No   Allergies  Allergen Reactions  . Penicillins Rash    Has patient had a PCN reaction causing immediate rash, facial/tongue/throat swelling, SOB or lightheadedness with hypotension: no Has patient had a PCN reaction causing severe rash involving mucus membranes or skin necrosis: white puss Has patient had a PCN reaction that required hospitalization no Has patient had a PCN reaction occurring within the last 10 years: yes If all of the above answers are "NO", then may proceed with Cephalosporin use.    Current Outpatient Medications on File Prior to Visit  Medication Sig Dispense Refill  . metoCLOPramide (REGLAN) 10 MG tablet Take 10 mg by mouth 4 (four) times daily.    Marland Kitchen omeprazole (PRILOSEC) 10 MG capsule Take 10 mg by mouth daily.    Marland Kitchen buPROPion (WELLBUTRIN) 100 MG tablet Take 1 tablet (100 mg total) by mouth 2 (two) times daily. (Patient not taking: Reported on 05/24/2019) 30 tablet 12  . Doxylamine-Pyridoxine (DICLEGIS) 10-10 MG TBEC Take 2 tablets by mouth at bedtime. If symptoms persist, add one tablet in the morning and one in the  afternoon (Patient not taking: Reported on 05/31/2019) 100 tablet 5  . ibuprofen (ADVIL,MOTRIN) 800 MG tablet Take 800 mg by mouth every 8 (eight) hours as needed for moderate pain. Reported on 03/27/2016    . terconazole (TERAZOL 3) 0.8 % vaginal cream Place 1 applicator vaginally at bedtime. (Patient not taking: Reported on 05/24/2019) 20 g 0   No current facility-administered medications on file prior to visit.      Exam   Vitals:   05/31/19 1320  BP: 126/84  Pulse: (!) 101  Weight: 264 lb 3.2 oz (119.8 kg)   Fetal Heart Rate (bpm): 160  Uterus:     Pelvic Exam: Perineum: no hemorrhoids, normal perineum   Vulva: normal external genitalia, no lesions   Vagina:  normal mucosa, normal discharge   Cervix: no lesions and normal, pap smear done.    Adnexa: normal adnexa and no mass, fullness, tenderness   Bony Pelvis: average  System:  General: well-developed, well-nourished female in no acute distress   Breast:  normal appearance, no masses or tenderness   Skin: normal coloration and turgor, no rashes   Neurologic: oriented, normal, negative, normal mood   Extremities: normal strength, tone, and muscle mass, ROM of all joints is normal   HEENT PERRLA, extraocular movement intact and sclera clear, anicteric   Mouth/Teeth mucous membranes moist, pharynx normal without lesions and dental hygiene good   Neck supple and no masses   Cardiovascular: regular rate and rhythm   Respiratory:  no respiratory distress, normal breath sounds   Abdomen: soft, non-tender; bowel sounds normal; no masses,  no organomegaly     Assessment:   Pregnancy: [id number] Patient Active Problem List   Diagnosis Date Noted  . History of 2 cesarean sections 05/31/2019  . Multigravida of advanced maternal age in second trimester 05/31/2019  . Abdominal hernia without obstruction and without gangrene 05/31/2019  . Headache in pregnancy, antepartum, second trimester 05/31/2019  . Nausea and vomiting during pregnancy prior to [redacted] weeks gestation 05/31/2019  . Supervision of other normal pregnancy, antepartum 05/24/2019  . Prediabetes 11/21/2017     Plan:  1. Supervision of other normal pregnancy, antepartum --Anticipatory guidance about next visits/weeks of pregnancy given.  --Reviewed safety, visitor policy, and COVID testing for scheduled IOL or and C/S and for active labor admission.  Reassurance about COVID-19 with young healthy women according to current data. Discussed possible changes to visits, including televisits, that may occur due to COVID-19.  The office remains open if pt needs to be seen and MAU is open 24 hours/day for OB emergencies. - Obstetric Panel, Including HIV - Culture, OB Urine - Genetic Screening - AFP, Serum, Open Spina Bifida - Hemoglobinopathy evaluation - Blood Pressure Monitoring (BLOOD PRESSURE CUFF) MISC; 1 Device by  Does not apply route once a week.  Dispense: 1 each; Refill: 0 - Babyscripts Schedule Optimization - Urine cytology ancillary only([place]) - [place] MFM OB DETAIL +14 WK; Future - Prenat w/o A Vit-FeFum-FePo-FA (CONCEPT OB) 130-92.4-1 MG CAPS; Take 1 capsule by mouth daily.  Dispense: 30 capsule; Refill: 11 - Urine cytology ancillary only([place])  2. Candidal skin infection --Rash between buttocks with erythema, slight silvery scaling and small fissure at bottom of rash. --Will treat with Mycolog, pt to f/u if not improved.  - nystatin-triamcinolone ointment (MYCOLOG); Apply 1 application topically 2 (two) times daily.  Dispense: 30 g; Refill: 0  3. Nausea and vomiting during pregnancy prior to [redacted] weeks gestation --Pt takes Reglan and  Prilosec daily for GERD/ulcer management. --Add Phenergan. Zofran to use sparingly. - promethazine (PHENERGAN) 25 MG tablet; Take 0.5-1 tablets (12.5-25 mg total) by mouth every 6 (six) hours as needed for nausea.  Dispense: 30 tablet; Refill: 3 - ondansetron (ZOFRAN) 4 MG tablet; Take 1 tablet (4 mg total) by mouth every 8 (eight) hours as needed for nausea or vomiting.  Dispense: 20 tablet; Refill: 0   4. Headache in pregnancy, antepartum, second trimester --N/v management should improve PO intake and h/a. --Add Fioricet x 20 tabs for occasional use until h/a improve - butalbital-acetaminophen-caffeine (FIORICET) 50-325-40 MG tablet; Take 1-2 tablets by mouth every 6 (six) hours as needed for headache.  Dispense: 20 tablet; Refill: 0  5. Abdominal hernia without obstruction and without gangrene, recurrence not specified, unspecified hernia type --Pt without pain, but noticed protruding area above umbilicus making her abdomen appear larger.   --Abdomen soft, nontender  6. Multigravida of advanced maternal age in second trimester - US MFM OB DETAIL +14 WK; Future  7. History of 2 cesarean sections --Last delivery 14 years ago. Plans repeat.    Initial labs drawn. Continue prenatal vitamins. Discussed and offered genetic screening options, including Quad screen/AFP, NIPS testing, and option to decline testing. Benefits/risks/alternatives reviewed. Pt aware that anatomy US is form of genetic screening with lower accuracy in detecting trisomies than blood work.  Pt chooses genetic screening today. NIPS: ordered. Ultrasound discussed; fetal anatomic survey: ordered. Problem list reviewed and updated. The [facility] with multiple MDs and other Advanced Practice Providers was explained to patient; also emphasized that residents, students are part of our team. Routine obstetric precautions reviewed. Return in about 4 weeks (around 06/28/2019).   Sharen CounterLisa Leftwich-Kirby, CNM 05/31/19 5:16 PM

## 2019-06-02 LAB — URINE CYTOLOGY ANCILLARY ONLY
Chlamydia: NEGATIVE
Neisseria Gonorrhea: NEGATIVE

## 2019-06-02 LAB — URINE CULTURE, OB REFLEX

## 2019-06-02 LAB — CULTURE, OB URINE

## 2019-06-03 LAB — OBSTETRIC PANEL, INCLUDING HIV
Antibody Screen: NEGATIVE
Basophils Absolute: 0 10*3/uL (ref 0.0–0.2)
Basos: 0 %
EOS (ABSOLUTE): 0.1 10*3/uL (ref 0.0–0.4)
Eos: 1 %
HIV Screen 4th Generation wRfx: NONREACTIVE
Hematocrit: 36.1 % (ref 34.0–46.6)
Hemoglobin: 11.6 g/dL (ref 11.1–15.9)
Hepatitis B Surface Ag: NEGATIVE
Immature Grans (Abs): 0.1 10*3/uL (ref 0.0–0.1)
Immature Granulocytes: 1 %
Lymphocytes Absolute: 1.6 10*3/uL (ref 0.7–3.1)
Lymphs: 17 %
MCH: 27.1 pg (ref 26.6–33.0)
MCHC: 32.1 g/dL (ref 31.5–35.7)
MCV: 84 fL (ref 79–97)
Monocytes Absolute: 0.6 10*3/uL (ref 0.1–0.9)
Monocytes: 7 %
Neutrophils Absolute: 7.1 10*3/uL — ABNORMAL HIGH (ref 1.4–7.0)
Neutrophils: 74 %
Platelets: 383 10*3/uL (ref 150–450)
RBC: 4.28 x10E6/uL (ref 3.77–5.28)
RDW: 12.9 % (ref 11.7–15.4)
RPR Ser Ql: NONREACTIVE
Rh Factor: POSITIVE
Rubella Antibodies, IGG: 6.01 index (ref >0.99)
WBC: 9.5 10*3/uL (ref 3.4–10.8)

## 2019-06-03 LAB — AFP, SERUM, OPEN SPINA BIFIDA
AFP MoM: 1.01
AFP Value: 27.7 ng/mL
Gest. Age on Collection Date: 16.3 weeks
Maternal Age At EDD: 38.8 yr
OSBR Risk 1 IN: 10000
Test Results:: NEGATIVE
Weight: 252 [lb_av]

## 2019-06-03 LAB — HEMOGLOBINOPATHY EVALUATION
HGB C: 0 %
HGB S: 0 %
HGB VARIANT: 0 %
Hemoglobin A2 Quantitation: 2.7 % (ref 1.8–3.2)
Hemoglobin F Quantitation: 0 % (ref 0.0–2.0)
Hgb A: 97.3 % (ref 96.4–98.8)

## 2019-06-03 LAB — URINE CYTOLOGY ANCILLARY ONLY
Chlamydia: NEGATIVE
Neisseria Gonorrhea: NEGATIVE

## 2019-06-07 ENCOUNTER — Telehealth: Payer: Self-pay | Admitting: Advanced Practice Midwife

## 2019-06-07 DIAGNOSIS — Z3480 Encounter for supervision of other normal pregnancy, unspecified trimester: Secondary | ICD-10-CM

## 2019-06-07 DIAGNOSIS — B372 Candidiasis of skin and nail: Secondary | ICD-10-CM

## 2019-06-07 MED ORDER — PRENATE MINI 18-0.6-0.4-350 MG PO CAPS: 1.0000 | ORAL_CAPSULE | Freq: Every day | ORAL | 11 refills | Status: DC

## 2019-06-07 MED ORDER — NYSTATIN 100000 UNIT/GM EX CREA: 1.0000 "application " | TOPICAL_CREAM | Freq: Two times a day (BID) | CUTANEOUS | 1 refills | Status: DC

## 2019-06-07 MED ORDER — TRIAMCINOLONE ACETONIDE 0.025 % EX OINT: 1.0000 "application " | TOPICAL_OINTMENT | Freq: Two times a day (BID) | CUTANEOUS | 0 refills | Status: DC

## 2019-06-07 NOTE — Telephone Encounter (Signed)
Called pt to follow up on symptoms and treatment following New Ob visit on 05/31/19.  Pt reports that the Mycolog Rx for candidal skin infection was not covered by insurance and prior authorization is requested.  Sent Rx today for nystatin and triamcinolone topical medications separately.  Rx for prenatal vitamin sent to pharmacy today.  Pt reports nausea/vomiting and headaches are much better after starting medications. Questions answered. Pt has f/u OB visit and anatomy US scheduled this month. Pt to call office with any questions.

## 2019-06-09 ENCOUNTER — Encounter: Payer: Self-pay | Admitting: Advanced Practice Midwife

## 2019-06-09 DIAGNOSIS — D563 Thalassemia minor: Secondary | ICD-10-CM | POA: Insufficient documentation

## 2019-06-09 DIAGNOSIS — O285 Abnormal chromosomal and genetic finding on antenatal screening of mother: Secondary | ICD-10-CM | POA: Insufficient documentation

## 2019-06-20 ENCOUNTER — Other Ambulatory Visit: Payer: Self-pay

## 2019-06-20 ENCOUNTER — Ambulatory Visit (HOSPITAL_COMMUNITY)
Admission: RE | Admit: 2019-06-20 | Discharge: 2019-06-20 | Disposition: A | Discharge status: Home / Self Care | Payer: Medicaid Other | Source: Ambulatory Visit | Attending: Obstetrics and Gynecology | Admitting: Obstetrics and Gynecology

## 2019-06-20 ENCOUNTER — Encounter (HOSPITAL_COMMUNITY): Payer: Self-pay | Admitting: *Deleted

## 2019-06-20 ENCOUNTER — Ambulatory Visit (HOSPITAL_COMMUNITY): Payer: Medicaid Other | Admitting: *Deleted

## 2019-06-20 DIAGNOSIS — E669 Obesity, unspecified: Secondary | ICD-10-CM

## 2019-06-20 DIAGNOSIS — Z148 Genetic carrier of other disease: Secondary | ICD-10-CM | POA: Diagnosis not present

## 2019-06-20 DIAGNOSIS — O99212 Obesity complicating pregnancy, second trimester: Secondary | ICD-10-CM

## 2019-06-20 DIAGNOSIS — Z3480 Encounter for supervision of other normal pregnancy, unspecified trimester: Secondary | ICD-10-CM

## 2019-06-20 DIAGNOSIS — Z363 Encounter for antenatal screening for malformations: Secondary | ICD-10-CM

## 2019-06-20 DIAGNOSIS — Z3A19 19 weeks gestation of pregnancy: Secondary | ICD-10-CM

## 2019-06-20 DIAGNOSIS — O09522 Supervision of elderly multigravida, second trimester: Secondary | ICD-10-CM | POA: Diagnosis not present

## 2019-06-21 ENCOUNTER — Other Ambulatory Visit: Payer: Self-pay | Admitting: Advanced Practice Midwife

## 2019-06-21 ENCOUNTER — Encounter: Payer: Self-pay | Admitting: Advanced Practice Midwife

## 2019-06-21 ENCOUNTER — Other Ambulatory Visit (HOSPITAL_COMMUNITY): Payer: Self-pay | Admitting: *Deleted

## 2019-06-21 DIAGNOSIS — O99210 Obesity complicating pregnancy, unspecified trimester: Secondary | ICD-10-CM | POA: Insufficient documentation

## 2019-06-21 DIAGNOSIS — O99212 Obesity complicating pregnancy, second trimester: Secondary | ICD-10-CM

## 2019-06-21 DIAGNOSIS — Z362 Encounter for other antenatal screening follow-up: Secondary | ICD-10-CM

## 2019-06-21 NOTE — Progress Notes (Signed)
Orders placed for CBC, CMP, protein/creatinine ratio, and A1C baseline labs.  Also order for nutrition consult.

## 2019-06-27 ENCOUNTER — Encounter (HOSPITAL_COMMUNITY): Payer: Self-pay

## 2019-06-27 ENCOUNTER — Ambulatory Visit (HOSPITAL_COMMUNITY): Payer: Medicaid Other | Attending: Obstetrics and Gynecology

## 2019-06-28 ENCOUNTER — Encounter: Payer: Self-pay | Admitting: Obstetrics

## 2019-06-28 ENCOUNTER — Ambulatory Visit (INDEPENDENT_AMBULATORY_CARE_PROVIDER_SITE_OTHER): Payer: Medicaid Other | Admitting: Obstetrics

## 2019-06-28 ENCOUNTER — Other Ambulatory Visit: Payer: Medicaid Other

## 2019-06-28 ENCOUNTER — Encounter: Payer: Self-pay | Admitting: Obstetrics and Gynecology

## 2019-06-28 ENCOUNTER — Other Ambulatory Visit: Payer: Self-pay

## 2019-06-28 VITALS — BP 126/81 | HR 108 | Wt 266.0 lb

## 2019-06-28 DIAGNOSIS — O09522 Supervision of elderly multigravida, second trimester: Secondary | ICD-10-CM

## 2019-06-28 DIAGNOSIS — Z3A20 20 weeks gestation of pregnancy: Secondary | ICD-10-CM

## 2019-06-28 DIAGNOSIS — O99212 Obesity complicating pregnancy, second trimester: Secondary | ICD-10-CM

## 2019-06-28 DIAGNOSIS — Z98891 History of uterine scar from previous surgery: Secondary | ICD-10-CM

## 2019-06-28 DIAGNOSIS — O0990 Supervision of high risk pregnancy, unspecified, unspecified trimester: Secondary | ICD-10-CM

## 2019-06-28 DIAGNOSIS — O0992 Supervision of high risk pregnancy, unspecified, second trimester: Secondary | ICD-10-CM

## 2019-06-28 DIAGNOSIS — R21 Rash and other nonspecific skin eruption: Secondary | ICD-10-CM

## 2019-06-28 NOTE — Progress Notes (Signed)
[facility] VIRTUAL VIDEO VISIT ENCOUNTER NOTE  Provider location: [facility] [place]   I connected with Sheila Davis on 06/28/19 at 10:15 AM EDT by [facility] Video Encounter at home and verified that I am speaking with the correct person using two identifiers.   I discussed the limitations, risks, security and privacy concerns of performing an evaluation and management service virtually and the availability of in person appointments. I also discussed with the patient that there may be a patient responsible charge related to this service. The patient expressed understanding and agreed to proceed. Subjective:  Sheila Davis is a 38 y.o. [id number] at 31w2dbeing seen today for ongoing prenatal care.  She is currently monitored for the following issues for this high-risk pregnancy and has Prediabetes; Supervision of other normal pregnancy, antepartum; History of 2 cesarean sections; Multigravida of advanced maternal age in second trimester; Abdominal hernia without obstruction and without gangrene; Headache in pregnancy, antepartum, second trimester; Nausea and vomiting during pregnancy prior to [redacted] weeks gestation; Alpha thalassemia silent carrier; Abnormal genetic test during pregnancy; and Obesity affecting pregnancy on their problem list.  Patient reports persistent rash under arms.  Contractions: Not present. Vag. Bleeding: None.  Movement: Present. Denies any leaking of fluid.   The following portions of the patient's history were reviewed and updated as appropriate: allergies, current medications, past family history, past medical history, past social history, past surgical history and problem list.   Objective:   Vitals:   06/28/19 1028  BP: 126/81  Pulse: (!) 108  Weight: 266 lb (120.7 kg)    Fetal Status:     Movement: Present     General:  Alert, oriented and cooperative. Patient is in no acute distress.  Respiratory: Normal respiratory  effort, no problems with respiration noted  Mental Status: Normal mood and affect. Normal behavior. Normal judgment and thought content.  Rest of physical exam deferred due to type of encounter  Imaging: [place]Mfm Ob Detail +14 Wk  Result Date: 06/20/2019 ----------------------------------------------------------------------  OBSTETRICS REPORT                       (Signed Final 06/20/2019 05:31 pm) ---------------------------------------------------------------------- Patient Info  ID #:       [id number]                         D.O.B.:  [birth year](38 yrs)  Name:       Sheila Davis                   Visit Date: 06/20/2019 02:21 pm ---------------------------------------------------------------------- Performed By  Performed By:     JGeorgie Chard       Ref. Address:     [address]  Attending:        Tama High MD        [facility]  Referred By:      Elvera Maria CNM ---------------------------------------------------------------------- Orders   #  Description                          Code         Ordered By   1  [place] MFM OB DETAIL +14 [place]              76811.01     LISA LEFTWICH-                                                        KIRBY  ----------------------------------------------------------------------   #  Order #                    Accession #                 Episode #   1  [id number]                  [id number]                  [id number]  ---------------------------------------------------------------------- Indications   Encounter for antenatal screening for          Z36.3   malformations (Low Risk NIPS)   Obesity complicating pregnancy BMI 93          O99.210 E66.9   Advanced maternal age multigravida 36+,         O66.522   second trimester   Genetic carrier (Silent carrier for Alpha-     Z14.8   Thalassemia)   Genetic carrier (Increased carrier risk for    Z14.8   SMA)   [redacted] weeks gestation of pregnancy                Z3A.19  ---------------------------------------------------------------------- Vital Signs  Weight (lb): 264                               Height:        5'5"  BMI:         43.93 ---------------------------------------------------------------------- Fetal Evaluation  Num Of Fetuses:         1  Fetal Heart Rate(bpm):  146  Cardiac Activity:       Observed  Presentation:           Transverse, head to maternal left  Placenta:               Anterior  P. Cord Insertion:      Visualized  Amniotic Fluid  AFI  FV:      Within normal limits                              Largest Pocket(cm)                              5.57 ---------------------------------------------------------------------- Biometry  BPD:      43.7  mm     G. Age:  19w 2d         54  %    CI:        79.23   %    70 - 86                                                          FL/HC:      18.7   %    16.1 - 18.3  HC:      155.2  mm     G. Age:  18w 3d         14  %    HC/AC:      1.12        1.09 - 1.39  AC:      138.3  mm     G. Age:  19w 2d         49  %    FL/BPD:     66.4   %  FL:         29  mm     G. Age:  18w 6d         35  %    FL/AC:      21.0   %    20 - 24  HUM:      28.4  mm     G. Age:  19w 1d         51  %  CER:      21.7  mm     G. Age:  20w 5d         83  %  NFT:       3.3  mm  CM:        3.8  mm  Est. FW:     270  gm    0 lb 10 oz      39  % ---------------------------------------------------------------------- OB History  Gravidity:    5         Term:   2         SAB:   2  Living:       2 ---------------------------------------------------------------------- Gestational Age  LMP:           19w 1d        Date:  02/06/19                 EDD:   11/13/19  U/S Today:     19w 0d                                        EDD:   11/14/19  Best:  19w 1d     Det. By:  LMP  (02/06/19)          EDD:   11/13/19 ---------------------------------------------------------------------- Anatomy  Cranium:               Appears normal         Aortic Arch:            Not well visualized  Cavum:                 Appears normal         Ductal Arch:            Not well visualized  Ventricles:            Appears normal         Diaphragm:              Appears normal  Choroid Plexus:        Appears normal         Stomach:                Appears normal, left                                                                        sided  Cerebellum:            Appears normal         Abdomen:                Appears normal  Posterior Fossa:       Appears normal         Abdominal Wall:         Appears nml (cord                                                                        insert, abd wall)  Nuchal Fold:           Appears normal         Cord Vessels:           Appears normal (3                                                                        vessel cord)  Face:                  Not well visualized    Kidneys:                Appear normal  Lips:                  Appears normal         Bladder:  Appears normal  Thoracic:              Appears normal         Spine:                  Ltd views no                         Appears normal                                 intracranial signs of                                                                        NTD  Heart:                 Appears normal         Upper Extremities:      Appears normal                         (4CH, axis, and                         situs)  RVOT:                  Not well visualized    Lower Extremities:      Appears normal  LVOT:                  Not well visualized ---------------------------------------------------------------------- Impression  We performed fetal anatomy scan. No makers of  aneuploidies or fetal structural defects are seen. Fetal  biometry is consistent with her  previously-established dates.  Amniotic fluid is normal and good fetal activity is seen.  Patient understands the limitations of ultrasound in detecting  fetal anomalies.  Maternal obesity imposes limitations on the resolution of  images, and failure to detect fetal anomalies is more common  in obese pregnant women. As maternal obesity makes  clinical assessment of fetal growth difficult, we recommend  serial growth scans until delivery.  On cell-free fetal DNA screening, the risks of fetal  aneuploidies are not increased.  Patient has an increased carrier risk for Spinal Muscular  Atrophy and is a silent carrier for alpha-thalassemia. I  recommended genetic counseling. ---------------------------------------------------------------------- Recommendations  -Genetic counseling 1 to 2 weeks.  -An appointment was made for her to return in 4 weeks for  completion of fetal anatomy. ----------------------------------------------------------------------                  Tama High, MD Electronically Signed Final Report   06/20/2019 05:31 pm ----------------------------------------------------------------------   Assessment and Plan:  Pregnancy: [id number] at 57w2d1. Supervision of high risk pregnancy, antepartum  2. Multigravida of advanced maternal age in second trimester  3. Obesity affecting pregnancy in second trimester Rx: - Hemoglobin A1c - Protein / creatinine ratio, urine - Comp Met (CMET) - CBC  4. History of 2 cesarean sections  5. Rash Rx: - Ambulatory referral to Dermatology   Preterm labor symptoms and general obstetric precautions including but not limited to  vaginal bleeding, contractions, leaking of fluid and fetal movement were reviewed in detail with the patient. I discussed the assessment and treatment plan with the patient. The patient was provided an opportunity to ask questions and all were answered. The patient agreed with the plan and demonstrated an understanding of the  instructions. The patient was advised to call back or seek an in-person office evaluation/go to [facility] at [facility] for any urgent or concerning symptoms. Please refer to After Visit Summary for other counseling recommendations.   I provided 10 minutes of face-to-face time during this encounter.  Return in about 4 weeks (around 07/26/2019) for MyChart.  Future Appointments  Date Time Provider [place]  07/18/2019  9:45 AM [place] [facility]  07/18/2019  9:45 AM [place] 2 WH-MFCUS M[facility]-US  07/26/2019  1:00 PM [place] Mode, MD [place] None    Baltazar Najjar, [place] for [facility] 06/28/2019

## 2019-06-28 NOTE — Progress Notes (Signed)
Pt states she has a rash under arm, has tried multiple mediation with no relief. Pt is nauseas most of time and does not have appetite.

## 2019-06-28 NOTE — Addendum Note (Signed)
Addended by: Baltazar Najjar A on: 06/28/2019 12:20 PM   Modules accepted: Level of Service

## 2019-06-29 LAB — COMPREHENSIVE METABOLIC PANEL
ALT: 11 IU/L (ref 0–32)
AST: 8 IU/L (ref 0–40)
Albumin/Globulin Ratio: 1.3 (ref 1.2–2.2)
Albumin: 3.5 g/dL — ABNORMAL LOW (ref 3.8–4.8)
Alkaline Phosphatase: 73 IU/L (ref 39–117)
BUN/Creatinine Ratio: 7 — ABNORMAL LOW (ref 9–23)
BUN: 4 mg/dL — ABNORMAL LOW (ref 6–20)
Bilirubin Total: <0.2 mg/dL (ref 0.0–1.2)
CO2: 20 mmol/L (ref 20–29)
Calcium: 9.1 mg/dL (ref 8.7–10.2)
Chloride: 103 mmol/L (ref 96–106)
Creatinine, Ser: 0.59 mg/dL (ref 0.57–1.00)
GFR calc Af Amer: 134 mL/min/{1.73_m2} (ref >59)
GFR calc non Af Amer: 117 mL/min/{1.73_m2} (ref >59)
Globulin, Total: 2.7 g/dL (ref 1.5–4.5)
Glucose: 100 mg/dL — ABNORMAL HIGH (ref 65–99)
Potassium: 4 mmol/L (ref 3.5–5.2)
Sodium: 137 mmol/L (ref 134–144)
Total Protein: 6.2 g/dL (ref 6.0–8.5)

## 2019-06-29 LAB — CBC
Hematocrit: 34.7 % (ref 34.0–46.6)
Hemoglobin: 11.3 g/dL (ref 11.1–15.9)
MCH: 27.2 pg (ref 26.6–33.0)
MCHC: 32.6 g/dL (ref 31.5–35.7)
MCV: 84 fL (ref 79–97)
Platelets: 347 x10E3/uL (ref 150–450)
RBC: 4.15 x10E6/uL (ref 3.77–5.28)
RDW: 13.3 % (ref 11.7–15.4)
WBC: 10 x10E3/uL (ref 3.4–10.8)

## 2019-06-29 LAB — PROTEIN / CREATININE RATIO, URINE
Creatinine, Urine: 106.1 mg/dL
Protein, Ur: 10.5 mg/dL
Protein/Creat Ratio: 99 mg/g{creat} (ref 0–200)

## 2019-06-29 LAB — HEMOGLOBIN A1C
Est. average glucose Bld gHb Est-mCnc: 114 mg/dL
Hgb A1c MFr Bld: 5.6 % (ref 4.8–5.6)

## 2019-07-13 ENCOUNTER — Ambulatory Visit: Payer: Medicaid Other

## 2019-07-18 ENCOUNTER — Ambulatory Visit (HOSPITAL_COMMUNITY)
Admission: RE | Admit: 2019-07-18 | Discharge: 2019-07-18 | Disposition: A | Discharge status: Home / Self Care | Payer: Medicaid Other | Source: Ambulatory Visit | Attending: Obstetrics and Gynecology | Admitting: Obstetrics and Gynecology

## 2019-07-18 ENCOUNTER — Ambulatory Visit (HOSPITAL_COMMUNITY): Payer: Medicaid Other | Admitting: *Deleted

## 2019-07-18 ENCOUNTER — Other Ambulatory Visit: Payer: Self-pay

## 2019-07-18 ENCOUNTER — Other Ambulatory Visit (HOSPITAL_COMMUNITY): Payer: Self-pay | Admitting: *Deleted

## 2019-07-18 DIAGNOSIS — O09522 Supervision of elderly multigravida, second trimester: Secondary | ICD-10-CM

## 2019-07-18 DIAGNOSIS — O99210 Obesity complicating pregnancy, unspecified trimester: Secondary | ICD-10-CM

## 2019-07-18 DIAGNOSIS — E669 Obesity, unspecified: Secondary | ICD-10-CM | POA: Diagnosis not present

## 2019-07-18 DIAGNOSIS — Z148 Genetic carrier of other disease: Secondary | ICD-10-CM

## 2019-07-18 DIAGNOSIS — Z3A23 23 weeks gestation of pregnancy: Secondary | ICD-10-CM

## 2019-07-18 DIAGNOSIS — O99212 Obesity complicating pregnancy, second trimester: Secondary | ICD-10-CM | POA: Diagnosis not present

## 2019-07-18 DIAGNOSIS — Z362 Encounter for other antenatal screening follow-up: Secondary | ICD-10-CM

## 2019-07-18 DIAGNOSIS — Z3480 Encounter for supervision of other normal pregnancy, unspecified trimester: Secondary | ICD-10-CM | POA: Diagnosis present

## 2019-07-26 ENCOUNTER — Encounter: Payer: Self-pay | Admitting: Obstetrics and Gynecology

## 2019-07-26 ENCOUNTER — Telehealth (INDEPENDENT_AMBULATORY_CARE_PROVIDER_SITE_OTHER): Payer: Medicaid Other | Admitting: Obstetrics and Gynecology

## 2019-07-26 DIAGNOSIS — O99212 Obesity complicating pregnancy, second trimester: Secondary | ICD-10-CM

## 2019-07-26 DIAGNOSIS — D563 Thalassemia minor: Secondary | ICD-10-CM

## 2019-07-26 DIAGNOSIS — Z3480 Encounter for supervision of other normal pregnancy, unspecified trimester: Secondary | ICD-10-CM

## 2019-07-26 DIAGNOSIS — O09522 Supervision of elderly multigravida, second trimester: Secondary | ICD-10-CM

## 2019-07-26 DIAGNOSIS — Z3A24 24 weeks gestation of pregnancy: Secondary | ICD-10-CM

## 2019-07-26 DIAGNOSIS — O34219 Maternal care for unspecified type scar from previous cesarean delivery: Secondary | ICD-10-CM

## 2019-07-26 DIAGNOSIS — O285 Abnormal chromosomal and genetic finding on antenatal screening of mother: Secondary | ICD-10-CM

## 2019-07-26 DIAGNOSIS — Z98891 History of uterine scar from previous surgery: Secondary | ICD-10-CM

## 2019-07-26 MED ORDER — BLOOD PRESSURE KIT DEVI: 1.0000 | 0 refills | Status: DC | PRN

## 2019-07-26 NOTE — Progress Notes (Signed)
S/w pt for mychart visit. Pt reports fetal movement with occasional back pain. Pt's BP cuff was sent to the wrong pharmacy, re-sent today to [facility].

## 2019-07-26 NOTE — Progress Notes (Signed)
TELEHEALTH OBSTETRICS PRENATAL VIRTUAL VIDEO VISIT ENCOUNTER NOTE  Provider location: [facility]   I connected with Sheila Davis on 07/26/19 at  1:00 PM EDT by MyChart Video Encounter at home and verified that I am speaking with the correct person using two identifiers.   I discussed the limitations, risks, security and privacy concerns of performing an evaluation and management service virtually and the availability of in person appointments. I also discussed with the patient that there may be a patient responsible charge related to this service. The patient expressed understanding and agreed to proceed. Subjective:  Sheila Davis is a 38 y.o. [id number] at 562w2d being seen today for ongoing prenatal care.  She is currently monitored for the following issues for this high-risk pregnancy and has Prediabetes; Supervision of other normal pregnancy, antepartum; History of 2 cesarean sections; Multigravida of advanced maternal age in second trimester; Abdominal hernia without obstruction and without gangrene; Headache in pregnancy, antepartum, second trimester; Nausea and vomiting during pregnancy prior to [redacted] weeks gestation; Alpha thalassemia silent carrier; Abnormal genetic test during pregnancy; and Obesity affecting pregnancy on their problem list.  Patient reports no complaints.  Contractions: Not present. Vag. Bleeding: None.  Movement: Present. Denies any leaking of fluid.   The following portions of the patient's history were reviewed and updated as appropriate: allergies, current medications, past family history, past medical history, past social history, past surgical history and problem list.   Objective:  There were no vitals filed for this visit.  Fetal Status:     Movement: Present     General:  Alert, oriented and cooperative. Patient is in no acute distress.  Respiratory: Normal respiratory effort, no problems with respiration noted  Mental Status:  Normal mood and affect. Normal behavior. Normal judgment and thought content.  Rest of physical exam deferred due to type of encounter  Imaging: [place] Mfm Ob Follow Up  Result Date: 07/18/2019 ----------------------------------------------------------------------  OBSTETRICS REPORT                       (Signed Final 07/18/2019 11:32 am) ---------------------------------------------------------------------- Patient Info  ID #:       [id number]                          D.O.B.:  [birth year] (38 yrs)  Name:       Sheila Davis                   Visit Date: 07/18/2019 10:00 am ---------------------------------------------------------------------- Performed By  Performed By:     Sandi MealyJovancia Adrien        Ref. Address:     [address] [place]                    RDMS                                                             [address]  Jacky Kindle  Attending:        Noralee Space MD        [facility]  Referred By:      Hurshel Party CNM ---------------------------------------------------------------------- Orders   #  Description                          Code         Ordered By   1  [place] MFM OB FOLLOW UP                  [phone]     [facility]  ----------------------------------------------------------------------   #  Order #                    Accession #                 Episode #   1  [id number]                  [id number]                  [id number]  ---------------------------------------------------------------------- Indications   Encounter for other antenatal screening        Z36.2   follow-up  (Low Risk NIPS)   Obesity complicating pregnancy BMI 43          O99.210 E66.9   Advanced maternal age multigravida 3+,        O51.522   second trimester   Genetic carrier (Silent carrier for Alpha-     Z14.8   Thalassemia)   Genetic carrier (Increased  carrier risk for    Z14.8   SMA)   [redacted] weeks gestation of pregnancy                Z3A.23  ---------------------------------------------------------------------- Vital Signs                                                 Height:        5'5" ---------------------------------------------------------------------- Fetal Evaluation  Num Of Fetuses:         1  Fetal Heart Rate(bpm):  158  Cardiac Activity:       Observed  Presentation:           Transverse, head to maternal left  Placenta:               Anterior  P. Cord Insertion:      Previously Visualized  Amniotic Fluid  AFI FV:      Within normal limits                              Largest Pocket(cm)  5.68 ---------------------------------------------------------------------- Biometry  BPD:      58.6  mm     G. Age:  24w 0d         76  %    CI:        86.96   %    70 - 86                                                          FL/HC:      18.6   %    19.2 - 20.8  HC:      197.8  mm     G. Age:  22w 0d          5  %    HC/AC:      1.06        1.05 - 1.21  AC:      185.8  mm     G. Age:  23w 3d         49  %    FL/BPD:     62.8   %    71 - 87  FL:       36.8  mm     G. Age:  21w 5d          6  %    FL/AC:      19.8   %    20 - 24  HUM:      39.4  mm     G. Age:  24w 0d         63  %  LV:        5.2  mm  Est. FW:     521  gm      1 lb 2 oz     21  % ---------------------------------------------------------------------- OB History  Gravidity:    5         Term:   2         SAB:   2  Living:       2 ---------------------------------------------------------------------- Gestational Age  LMP:           23w 1d        Date:  02/06/19                 EDD:   11/13/19  U/S Today:     22w 6d                                        EDD:   11/15/19  Best:          23w 1d     Det. By:  LMP  (02/06/19)          EDD:   11/13/19 ---------------------------------------------------------------------- Anatomy  Cranium:               Appears normal          Aortic Arch:            Appears normal  Cavum:                 Appears normal         Ductal Arch:  Not well visualized  Ventricles:            Appears normal         Diaphragm:              Appears normal  Choroid Plexus:        Previously seen        Stomach:                Appears normal, left                                                                        sided  Cerebellum:            Previously seen        Abdomen:                Appears normal  Posterior Fossa:       Previously seen        Abdominal Wall:         Previously seen  Nuchal Fold:           Previously seen        Cord Vessels:           Previously seen  Face:                  Appears normal         Kidneys:                Previously seen                         (orbits and profile)  Lips:                  Previously seen        Bladder:                Appears normal  Thoracic:              Appears normal         Spine:                  Previously seen  Heart:                 Appears normal         Upper Extremities:      Previously seen                         (4CH, axis, and                         situs)  RVOT:                  Appears normal         Lower Extremities:      Previously seen  LVOT:                  Appears normal  Other:  Female gender. Technically difficult due to maternal habitus and fetal          position. ---------------------------------------------------------------------- Cervix Uterus Adnexa  Cervix  Length:           5.23  cm.  Normal appearance by transabdominal scan. ---------------------------------------------------------------------- Impression  Patient returned for completion of fetal anatomy. Patient has  an increased carrier risk for Spinal Muscular Atrophy and is a  silent carrier for alpha-thalassemia.  She opted not to meet  with our genetic counselor.  Amniotic fluid is normal and good fetal activity is seen. Fetal  growth is appropriate for gestational age. Fetal anatomical  survey was  completed and appears normal. ---------------------------------------------------------------------- Recommendations  -An appointment was made for her to return in 4 weeks for  fetal growth assessment (BMI>40). ----------------------------------------------------------------------                  Noralee Space, MD Electronically Signed Final Report   07/18/2019 11:32 am ----------------------------------------------------------------------   Assessment and Plan:  Pregnancy: Z6X0960 at [redacted]w[redacted]d 1. Supervision of other normal pregnancy, antepartum Patient is doing well without complaints Patient undecided on pediatrician Patient desires BTL at the time of her c-section- needs to sign forms next visit Third trimester labs and glucola next visit  2. Obesity affecting pregnancy in second trimester   3. History of 2 cesarean sections Will be scheduled for repeat with BTL at 39 weeks  4. Multigravida of advanced maternal age in second trimester   5. Alpha thalassemia silent carrier Patient now desires genetic counseling  6. Abnormal genetic test during pregnancy See above  Preterm labor symptoms and general obstetric precautions including but not limited to vaginal bleeding, contractions, leaking of fluid and fetal movement were reviewed in detail with the patient. I discussed the assessment and treatment plan with the patient. The patient was provided an opportunity to ask questions and all were answered. The patient agreed with the plan and demonstrated an understanding of the instructions. The patient was advised to call back or seek an in-person office evaluation/go to [facility] at [facility] for any urgent or concerning symptoms. Please refer to After Visit Summary for other counseling recommendations.   I provided 11 minutes of face-to-face time during this encounter.  Return in about 4 weeks (around 08/23/2019) for in person, ROB, 2 hr glucola next visit.  Future  Appointments  Date Time Provider Department Center  08/16/2019  1:15 PM WH-MFC NURSE WH-MFC MFC-US  08/16/2019  1:15 PM WH-MFC [place] 4 WH-MFCUS MFC-US    Catalina Antigua, MD Center for [employer], [facility]

## 2019-08-02 ENCOUNTER — Ambulatory Visit (HOSPITAL_COMMUNITY): Payer: Medicaid Other

## 2019-08-16 ENCOUNTER — Ambulatory Visit (HOSPITAL_COMMUNITY): Payer: Self-pay | Admitting: Genetic Counselor

## 2019-08-16 ENCOUNTER — Other Ambulatory Visit (HOSPITAL_COMMUNITY): Payer: Self-pay | Admitting: *Deleted

## 2019-08-16 ENCOUNTER — Ambulatory Visit (HOSPITAL_BASED_OUTPATIENT_CLINIC_OR_DEPARTMENT_OTHER): Payer: Medicaid Other | Admitting: Genetic Counselor

## 2019-08-16 ENCOUNTER — Ambulatory Visit (HOSPITAL_COMMUNITY)
Admission: RE | Admit: 2019-08-16 | Discharge: 2019-08-16 | Disposition: A | Discharge status: Home / Self Care | Payer: Medicaid Other | Source: Ambulatory Visit | Attending: Advanced Practice Midwife | Admitting: Advanced Practice Midwife

## 2019-08-16 ENCOUNTER — Other Ambulatory Visit: Payer: Self-pay

## 2019-08-16 ENCOUNTER — Ambulatory Visit (HOSPITAL_COMMUNITY): Payer: Medicaid Other | Admitting: *Deleted

## 2019-08-16 DIAGNOSIS — D563 Thalassemia minor: Secondary | ICD-10-CM | POA: Diagnosis present

## 2019-08-16 DIAGNOSIS — O09522 Supervision of elderly multigravida, second trimester: Secondary | ICD-10-CM | POA: Diagnosis not present

## 2019-08-16 DIAGNOSIS — Z315 Encounter for genetic counseling: Secondary | ICD-10-CM

## 2019-08-16 DIAGNOSIS — Z148 Genetic carrier of other disease: Secondary | ICD-10-CM

## 2019-08-16 DIAGNOSIS — Z3A27 27 weeks gestation of pregnancy: Secondary | ICD-10-CM | POA: Diagnosis not present

## 2019-08-16 DIAGNOSIS — E669 Obesity, unspecified: Secondary | ICD-10-CM | POA: Diagnosis not present

## 2019-08-16 DIAGNOSIS — O99212 Obesity complicating pregnancy, second trimester: Secondary | ICD-10-CM

## 2019-08-16 DIAGNOSIS — Z3480 Encounter for supervision of other normal pregnancy, unspecified trimester: Secondary | ICD-10-CM | POA: Insufficient documentation

## 2019-08-16 DIAGNOSIS — Z362 Encounter for other antenatal screening follow-up: Secondary | ICD-10-CM

## 2019-08-16 NOTE — Progress Notes (Signed)
08/18/2019  Sheila Davis [birth year] MRN: [id number] DOV: 08/16/2019  Sheila Davis presented to the [facility] for a genetics consultation regarding her carrier status for alpha-thalassemia and spinal muscular atrophy. Sheila Davis came to her appointment alone due to COVID-19 visitor restrictions.   Indication for genetic counseling - Silent carrier for alpha-thalassemia - Increased risk to be silent carrier for spinal muscular atrophy (SMA)  Prenatal history  Sheila Davis is a [id number], 38 y.o. female. Her current pregnancy has completed [redacted]w[redacted]d (Estimated Date of Delivery: 11/13/19).  Sheila Davis denied exposure to environmental toxins or chemical agents. She denied the use of alcohol, tobacco or street drugs. She reported taking prenatal vitamins. She denied significant viral illnesses, fevers, and bleeding during the course of her pregnancy. Her medical and surgical histories were noncontributory.  Family History  A three generation pedigree was drafted and reviewed. The family history is remarkable for the following:  - Sheila Davis's mother died at 86 years of age due to ovarian cancer. Her maternal aunt, maternal uncle, and maternal grandfather died in their 70s to 45s of lung cancer, all of whom had a history of smoking. Though most cancers are thought to be sporadic or due to environmental factors, some families appear to have a strong predisposition to cancers.  When considering a family history of cancer, we look for common types of cancer in multiple family members occurring at younger than typical ages. We discussed the option of meeting with a cancer genetic counselor to discuss any possible screening or testing options available. If Ms. Cassel is concerned about the family history of cancer and would like to learn more about the family's chance for an inherited cancer syndrome, her doctor may refer her to the [facility] [phone]).    - [name]'s partner, Sheila Davis, has a sister who has two sons and a daughter with autism. Sheila Davis was not aware of whether or not these children have had a genetic work-up for their autism. We reviewed that autism can be isolated, multifactorial, or part of a genetic syndrome. Given that there are multiple affected siblings, it is possible that their autism may be part of a genetic syndrome. However, without further information about the cause of autism in the family, precise risk assessment for [name] children is limited.  - Sheila Davis father has epilepsy that was reportedly diagnosed later in his life. Seizures can occur secondary to a variety of environmental, lifestyle, and genetic factors. When epilepsy does not have an identified genetic cause, the chance that a first degree relative of someone with epilepsy will also have or develop epilepsy is approximately 2-5%. Given that Sheila Davis father is a second degree relative to the couple's fetus, recurrence risk for epilepsy may be slightly elevated over the general population risk of 1%. However, without knowing the etiology of the seizures in the family, precise risk assessment is limited.  The remaining family histories were reviewed and found to be noncontributory for birth defects, intellectual disability, recurrent pregnancy loss, and known genetic conditions.    The patient's ethnicity is African American. The father of the pregnancy's ethnicity is African American and Caucasian. [name] ancestry and consanguinity were denied. Pedigree will be scanned under Media.  Discussion  Sheila Davis had Horizon-14 carrier screening performed through [place]. The results of the screen identified her as a silent carrier for alpha-thalassemia (aa/a-). Alpha-thalassemia is different in its inheritance compared to other hemoglobinopathies as there are  two copies of two alpha globin genes (HBA1 and HBA2) on each chromosome 16, or four  alpha globin genes total (aa/aa). A person can be a carrier of one alpha gene mutation (aa/a-), also referred to as a "silent carrier". A person who carries two alpha globin gene mutations can either carry them in cis (both on the same chromosome, denoted as aa/--) or in trans (on different chromosomes, denoted as a-/a-). Alpha-thalassemia carriers of two mutations who have African American ancestry are more likely to have a trans arrangement (a-/a-); cis configuration is reported to be rare in individuals with African American ancestry.     There are several different forms of alpha-thalassemia. The most severe form of alpha-thalassemia, Hb Barts, is associated with an absence of alpha globin chain synthesis as a result of deletions of all four alpha globin genes (--/--).  Given that Sheila Davis is a silent carrier (aa/a-), her pregnancies would not be at increased risk for Hb Barts, even if her partner is a carrier for alpha-thalassemia. Hemoglobin H (HbH) disease is caused by three deleted or dysfunctioning alpha globin alleles (a-/--) and is characterized by microcytic hypochromic hemolytic anemia, hepatosplenomegaly, mild jaundice, and sometimes thalassemia-like bone changes. Given Sheila Davis's silent carrier status (aa/a-), the current fetus would only be at risk for HbH disease (a-/--), if her partner is a carrier for two alpha globin mutations in cis (aa/--). If this is the case, the risk for HbH disease in the pregnancy would be 1 in 4 (25%). However, if Sheila Davis's partner is a carrier for two alpha globin mutations, he would be more likely to carry them in trans configuration (a-/a-) than the cis configuration (aa/--), given his ethnicity. If he is a carrier of alpha-thalassemia in trans, then the pregnancy would not be at increased risk for HbH disease. Based on his ethnicity alone, Sheila Davis partner has up to a 1 in 30 chance of being any type of carrier for alpha-thalassemia.   Sheila Davis  was also found to have 2 copies of the SMN1 gene on Horizon-14 carrier screening; however, she also has the c.*3+80T>G polymorphism of SMN1 in intron 7 (also known as [id number].27134T>G). This puts her at increased risk (1 in 70) to be a silent 2+0 carrier for spinal muscular atrophy (SMA). SMA is a condition caused by mutations in the SMN1 gene. Typically, individuals have two copies of the SMN1 gene, with one copy present on each chromosome. In SMA silent carriers, both copies of the SMN1 gene are present on one chromosome, with no copies of SMN1 present on the other chromosome.  SMA is characterized by progressive muscle weakness and atrophy due to degeneration and loss of anterior horn cells (lower motor neurons) in the spinal cord and brain stem. We discussed the different types of SMA (0, I, II, and III), including differences in severity and age of onset. We also reviewed the autosomal recessive inheritance pattern of SMA. Based on his ethnicity alone, Ms. Doell partner currently has a 1 in 60 to 1 in 10 chance of being a carrier of SMA. If he is found to have 2 copies of SMN1, his risk of being a carrier is reduced but not eliminated. If both parents are carriers of SMA, there is a 1 in 4 (25%) chance of having an affected fetus.   Sheila Davis carrier screening was negative for the other 12 conditions screened. Thus, her risk to be a carrier for these additional conditions (listed separately in the laboratory  report) and thus to have an affected child has been reduced but not eliminated. We discussed that carrier testing for SMA and alpha-thalassemia is recommended for Sheila Davis's partner. Ms. Sharples indicated that her partner may not be interested in pursuing carrier screening for himself.  We also reviewed that Ms. Bartko had Panorama NIPS through [facility] that was low-risk for fetal aneuploidies. We reviewed that these results showed a less than 1 in 10,000 risk for trisomies 21, 18 and 13, and  monosomy X (Turner syndrome).  In addition, the risk for triploidy and sex chromosome trisomies (47,XXX and 47,XXY) was also low. Ms. Everetts elected to have cfDNA analysis for 22q11 deletion syndrome, which was also low risk (1 in 9000). We reviewed that while this testing identifies 35- 99% of pregnancies with trisomy 46, trisomy 64, sex chromosome trisomies (47,XXX and 47,XXY), and triploidy, it is NOT diagnostic. A positive test result requires confirmation by CVS or amniocentesis, and a negative test result does not rule out a fetal chromosome abnormality. She also understands that this testing does not identify all genetic conditions.  A follow-up ultrasound was performed today prior to our visit. The ultrasound report will be sent under separate cover. There were no visualized fetal anomalies or markers suggestive of aneuploidy on this ultrasound or Ms. Igarashi's prior two ultrasounds.  Ms. Danh was also counseled regarding diagnostic testing via amniocentesis. We discussed the technical aspects of the procedure and quoted up to a 1 in 500 (0.2%) risk for spontaneous pregnancy loss or other adverse pregnancy outcomes as a result of amniocentesis. Cultured cells from an amniocentesis sample allow for the visualization of a fetal karyotype, which can detect >99% of chromosomal aberrations. Chromosomal microarray can also be performed to identify smaller deletions or duplications of fetal chromosomal material. Amniocentesis could also be performed to assess whether the baby is affected by alpha-thalassemia or SMA. After careful consideration, Ms. Howeth declined amniocentesis at this time. She understands that amniocentesis is available at any point after 16 weeks of pregnancy and that she may opt to undergo the procedure at a later date should she change her mind.  Ms. Felling was undecided as to whether or not she desired partner carrier screening. She indicated that her partner would likely not  agree to having his blood drawn, but may be more amenable to carrier screening via saliva. She wished to discuss carrier screening with her partner and indicated that she would contact me if they were interested in pursuing this. We discussed that postnatal testing is always an option if there are any clinical concerns for either alpha-thalassemia or SMA in her child after birth. Additionally, we discussed the Early Check research study to add SMA to her baby's newborn screening panel free of charge. The Early Check study is able to identify infants affected with SMA much earlier than clinical diagnoses of SMA are often made. Ms. Clippinger indicated that she was interested in pursuing this, so she was given written information on how to enroll in the Early Check study.   I counseled Ms. Langseth regarding the above risks and available options. The approximate face-to-face time with the genetic counselor was 30 minutes.  In summary:  Discussed carrier screening results and options for follow-up testing  Silent carrier for alpha-thalassemia  Increased risk (1 in 20) to be silent carrier for spinal muscular atrophy (SMA)  Recommend partner carrier screening. Ms. Slates will contact me if her partner is interested in pursuing carrier screening via saliva  Gave  Ms. Lauro RegulusCrayton information about enrolling in the Early Check research study to add SMA onto her baby's newborn screening panel  Reviewed low-risk NIPS results  Reduction in risk for Down syndrome, trisomy 8118, trisomy 4313, triploidy, sex chromosome aneuploidies, and 22q11.2 deletion syndrome  Reviewed results of ultrasound  No fetal anomalies or markers seen  Reduction in risk for fetal aneuploidy  Offered additional testing and screening  Declined amniocentesis  Reviewed family history concerns   Gershon CraneHaley E Keigan Girten, MS Genetic Counselor

## 2019-08-23 ENCOUNTER — Other Ambulatory Visit: Payer: Medicaid Other

## 2019-08-23 ENCOUNTER — Other Ambulatory Visit: Payer: Self-pay

## 2019-08-23 ENCOUNTER — Ambulatory Visit (INDEPENDENT_AMBULATORY_CARE_PROVIDER_SITE_OTHER): Payer: Medicaid Other | Admitting: Nurse Practitioner

## 2019-08-23 ENCOUNTER — Encounter: Payer: Self-pay | Admitting: Nurse Practitioner

## 2019-08-23 VITALS — BP 118/66 | HR 95 | Wt 266.0 lb

## 2019-08-23 DIAGNOSIS — Z3A28 28 weeks gestation of pregnancy: Secondary | ICD-10-CM

## 2019-08-23 DIAGNOSIS — Z3480 Encounter for supervision of other normal pregnancy, unspecified trimester: Secondary | ICD-10-CM

## 2019-08-23 DIAGNOSIS — Z98891 History of uterine scar from previous surgery: Secondary | ICD-10-CM

## 2019-08-23 DIAGNOSIS — K469 Unspecified abdominal hernia without obstruction or gangrene: Secondary | ICD-10-CM

## 2019-08-23 DIAGNOSIS — O99891 Other specified diseases and conditions complicating pregnancy: Secondary | ICD-10-CM

## 2019-08-23 NOTE — Addendum Note (Signed)
Addended by: Maryruth Eve on: 08/23/2019 10:26 AM   Modules accepted: Orders

## 2019-08-23 NOTE — Progress Notes (Addendum)
    Subjective:  Sheila Davis is a 38 y.o. P7T0626 at [redacted]w[redacted]d being seen today for ongoing prenatal care.  She is currently monitored for the following issues for this low-risk pregnancy and has Prediabetes; Supervision of other normal pregnancy, antepartum; History of 2 cesarean sections; Multigravida of advanced maternal age in second trimester; Abdominal hernia without obstruction and without gangrene; Headache in pregnancy, antepartum, second trimester; Nausea and vomiting during pregnancy prior to [redacted] weeks gestation; Alpha thalassemia silent carrier; Abnormal genetic test during pregnancy; and Obesity affecting pregnancy on their problem list.  Patient reports no complaints.  Contractions: Not present. Vag. Bleeding: None.  Movement: Present. Denies leaking of fluid.   The following portions of the patient's history were reviewed and updated as appropriate: allergies, current medications, past family history, past medical history, past social history, past surgical history and problem list. Problem list updated.  Objective:   Vitals:   08/23/19 0926  BP: 118/66  Pulse: 95  Weight: 266 lb (120.7 kg)    Fetal Status: Fetal Heart Rate (bpm): 156 Fundal Height: 32 cm Movement: Present     General:  Alert, oriented and cooperative. Patient is in no acute distress.  Skin: Skin is warm and dry. No rash noted.   Cardiovascular: Normal heart rate noted  Respiratory: Normal respiratory effort, no problems with respiration noted  Abdomen: Soft, gravid, appropriate for gestational age. Pain/Pressure: Present     Pelvic:  Cervical exam deferred        Extremities: Normal range of motion.  Edema: None  Mental Status: Normal mood and affect. Normal behavior. Normal judgment and thought content.   Urinalysis:      Assessment and Plan:  Pregnancy: [id number] at [redacted]w[redacted]d  1. Supervision of other normal pregnancy, antepartum Doing well.  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 Weight  stable . Wants to get TDAP and flu at next visit. Still needs to get Babyscripts app - has BP cuff at home.  Advised to check weekly. Size greater than dates - watch fundal height - hard to measure exactly due to hernia.  Will watch glucola results as well.  Consider [place] if fundal height continues to be greater than dates.  2. History of 2 cesarean sections Plans repeat C/S - needs to meet with MD to sign papers.  Also wants BTL and has not signed papers yet.  3. Abdominal hernia without obstruction and without gangrene, recurrence not specified, unspecified hernia type Hard to specifically locate - bulge visible above the umbilicus when sitting up   Preterm labor symptoms and general obstetric precautions including but not limited to vaginal bleeding, contractions, leaking of fluid and fetal movement were reviewed in detail with the patient. Please refer to After Visit Summary for other counseling recommendations.  Return in about 2 weeks (around 09/06/2019) for In person ROB with MD to sign papers.  Earlie Server, RN, MSN, NP-BC Nurse Practitioner, [facility] for [employer], [employer] 08/23/2019 10:21 AM

## 2019-08-23 NOTE — Progress Notes (Signed)
Pt presents for ROB and 2 gtt labs.  Flu/Tdap deferred til NV in Dec per pt

## 2019-08-24 LAB — CBC
Hematocrit: 32.5 % — ABNORMAL LOW (ref 34.0–46.6)
Hemoglobin: 10.5 g/dL — ABNORMAL LOW (ref 11.1–15.9)
MCH: 26.9 pg (ref 26.6–33.0)
MCHC: 32.3 g/dL (ref 31.5–35.7)
MCV: 83 fL (ref 79–97)
Platelets: 323 10*3/uL (ref 150–450)
RBC: 3.91 x10E6/uL (ref 3.77–5.28)
RDW: 13.6 % (ref 11.7–15.4)
WBC: 10.1 10*3/uL (ref 3.4–10.8)

## 2019-08-24 LAB — HIV ANTIBODY (ROUTINE TESTING W REFLEX): HIV Screen 4th Generation wRfx: NONREACTIVE

## 2019-08-24 LAB — GLUCOSE TOLERANCE, 2 HOURS W/ 1HR
Glucose, 1 hour: 156 mg/dL (ref 65–179)
Glucose, 2 hour: 150 mg/dL (ref 65–152)
Glucose, Fasting: 101 mg/dL — ABNORMAL HIGH (ref 65–91)

## 2019-08-24 LAB — RPR: RPR Ser Ql: NONREACTIVE

## 2019-08-25 ENCOUNTER — Encounter: Payer: Self-pay | Admitting: Nurse Practitioner

## 2019-08-25 DIAGNOSIS — O24419 Gestational diabetes mellitus in pregnancy, unspecified control: Secondary | ICD-10-CM | POA: Insufficient documentation

## 2019-09-06 ENCOUNTER — Ambulatory Visit (INDEPENDENT_AMBULATORY_CARE_PROVIDER_SITE_OTHER): Payer: Medicaid Other | Admitting: Obstetrics & Gynecology

## 2019-09-06 ENCOUNTER — Encounter: Payer: Self-pay | Admitting: *Deleted

## 2019-09-06 VITALS — BP 118/88 | HR 76

## 2019-09-06 DIAGNOSIS — Z3A30 30 weeks gestation of pregnancy: Secondary | ICD-10-CM | POA: Diagnosis not present

## 2019-09-06 DIAGNOSIS — Z98891 History of uterine scar from previous surgery: Secondary | ICD-10-CM

## 2019-09-06 DIAGNOSIS — O09523 Supervision of elderly multigravida, third trimester: Secondary | ICD-10-CM | POA: Diagnosis not present

## 2019-09-06 DIAGNOSIS — Z3480 Encounter for supervision of other normal pregnancy, unspecified trimester: Secondary | ICD-10-CM

## 2019-09-06 DIAGNOSIS — O09522 Supervision of elderly multigravida, second trimester: Secondary | ICD-10-CM

## 2019-09-06 DIAGNOSIS — O24419 Gestational diabetes mellitus in pregnancy, unspecified control: Secondary | ICD-10-CM

## 2019-09-06 MED ORDER — ACCU-CHEK FASTCLIX LANCETS MISC: 1.0000 | Freq: Four times a day (QID) | 6 refills | Status: DC

## 2019-09-06 MED ORDER — ACCU-CHEK GUIDE VI STRP: ORAL_STRIP | 12 refills | Status: DC

## 2019-09-06 MED ORDER — ACCU-CHEK GUIDE W/DEVICE KIT: 1.0000 | PACK | Freq: Once | 0 refills | Status: AC

## 2019-09-06 NOTE — Progress Notes (Signed)
   TELEHEALTH VIRTUAL OBSTETRICS VISIT ENCOUNTER NOTE  I connected with Sheila Davis on 09/06/19 at  1:30 PM EST by telephone at home and verified that I am speaking with the correct person using two identifiers.   I discussed the limitations, risks, security and privacy concerns of performing an evaluation and management service by telephone and the availability of in person appointments. I also discussed with the patient that there may be a patient responsible charge related to this service. The patient expressed understanding and agreed to proceed.  Subjective:  Sheila Davis is a 38 y.o. K3T4656 at [redacted]w[redacted]d being followed for ongoing prenatal care.  She is currently monitored for the following issues for this high-risk pregnancy and has Prediabetes; Supervision of other normal pregnancy, antepartum; History of 2 cesarean sections; Multigravida of advanced maternal age in second trimester; Abdominal hernia without obstruction and without gangrene; Headache in pregnancy, antepartum, second trimester; Nausea and vomiting during pregnancy prior to [redacted] weeks gestation; Alpha thalassemia silent carrier; Abnormal genetic test during pregnancy; Obesity affecting pregnancy; and Gestational diabetes mellitus on their problem list.  Patient reports no complaints. Reports fetal movement. Denies any contractions, bleeding or leaking of fluid.   The following portions of the patient's history were reviewed and updated as appropriate: allergies, current medications, past family history, past medical history, past social history, past surgical history and problem list.   Objective:   General:  Alert, oriented and cooperative.   Mental Status: Normal mood and affect perceived. Normal judgment and thought content.  Rest of physical exam deferred due to type of encounter  Assessment and Plan:  Pregnancy: [id number] at [redacted]w[redacted]d 1. Supervision of other normal pregnancy, antepartum doin well  2. Gestational diabetes  mellitus (GDM) in third trimester, gestational diabetes method of control unspecified Failed 2 hr, pt informed VM had been left for her 11/17 approx, appt for teaching needed  3. Multigravida of advanced maternal age in second trimester   4. History of 2 cesarean sections Repeat 39 weeks  Preterm labor symptoms and general obstetric precautions including but not limited to vaginal bleeding, contractions, leaking of fluid and fetal movement were reviewed in detail with the patient.  I discussed the assessment and treatment plan with the patient. The patient was provided an opportunity to ask questions and all were answered. The patient agreed with the plan and demonstrated an understanding of the instructions. The patient was advised to call back or seek an in-person office evaluation/go to MAU at [facility] for any urgent or concerning symptoms. Please refer to After Visit Summary for other counseling recommendations.   I provided 11 minutes of non-face-to-face time during this encounter.  Return in about 2 weeks (around 38/15/2020) for in person.  Future Appointments  Date Time Provider [place]  09/27/2019  1:15 PM [facility]-MFC [place]    Emeterio Reeve, [place] for [place], [place]

## 2019-09-06 NOTE — Progress Notes (Signed)
Patient reports fetal movement with some uterine irritability and pressure.

## 2019-09-06 NOTE — Patient Instructions (Signed)
Gestational Diabetes Mellitus, Diagnosis Gestational diabetes (gestational diabetes mellitus) is a short-term (temporary) form of diabetes that can happen during pregnancy. It goes away after you give birth. It may be caused by one or both of these problems:  Your pancreas does not make enough of a hormone called insulin.  Your body does not respond in a normal way to insulin that it makes. Insulin lets sugars (glucose) go into cells in the body. This gives you energy. If you have diabetes, sugars cannot get into cells. This causes high blood sugar (hyperglycemia). If you get gestational diabetes, you are:  More likely to get it if you get pregnant again.  More likely to develop type 2 diabetes in the future. If gestational diabetes is treated, it may not hurt you or your baby. Your doctor will set treatment goals for you. In general, you should have these blood sugar levels:  After not eating for a long time (fasting): 95 mg/dL (5.3 mmol/L).  After meals (postprandial): ? One hour after a meal: at or below 140 mg/dL (7.8 mmol/L). ? Two hours after a meal: at or below 120 mg/dL (6.7 mmol/L).  A1c (hemoglobin A1c) level: 6-6.5%. Follow these instructions at home: Questions to ask your doctor   You may want to ask these questions: ? Do I need to meet with a diabetes educator? ? What equipment will I need to care for myself at home? ? What medicines do I need? When should I take them? ? How often do I need to check my blood sugar? ? What number can I call if I have questions? ? When is my next doctor's visit? General instructions  Take over-the-counter and prescription medicines only as told by your doctor.  Stay at a healthy weight during pregnancy.  Keep all follow-up visits as told by your doctor. This is important. Contact a doctor if:  Your blood sugar is at or above 240 mg/dL (13.3 mmol/L).  Your blood sugar is at or above 200 mg/dL (11.1 mmol/L) and you have ketones in  your pee (urine).  You have been sick or have had a fever for 2 days or more and you are not getting better.  You have any of these problems for more than 6 hours: ? You cannot eat or drink. ? You feel sick to your stomach (nauseous). ? You throw up (vomit). ? You have watery poop (diarrhea). Get help right away if:  Your blood sugar is lower than 54 mg/dL (3 mmol/L).  You get confused.  You have trouble: ? Thinking clearly. ? Breathing.  Your baby moves less than normal.  You have any of these: ? Moderate or large ketone levels in your pee. ? Blood coming from your vagina. ? Unusual fluid coming from your vagina. ? Early contractions. These may feel like tightness in your belly. Summary  Gestational diabetes is a short-term form of diabetes. It can happen while you are pregnant. It goes away after you give birth.  If gestational diabetes is treated, it may not hurt you or your baby. Your doctor will set treatment goals for you.  Keep all follow-up visits as told by your doctor. This is important. This information is not intended to replace advice given to you by your health care provider. Make sure you discuss any questions you have with your health care provider. Document Released: 01/14/2016 Document Revised: 10/29/2017 Document Reviewed: 10/26/2015 Elsevier Patient Education  2020 Elsevier Inc.  

## 2019-09-20 ENCOUNTER — Other Ambulatory Visit: Payer: Self-pay

## 2019-09-20 ENCOUNTER — Ambulatory Visit (INDEPENDENT_AMBULATORY_CARE_PROVIDER_SITE_OTHER): Payer: Medicaid Other | Admitting: Obstetrics & Gynecology

## 2019-09-20 VITALS — BP 133/81 | HR 98 | Wt 261.8 lb

## 2019-09-20 DIAGNOSIS — Z3A32 32 weeks gestation of pregnancy: Secondary | ICD-10-CM

## 2019-09-20 DIAGNOSIS — Z23 Encounter for immunization: Secondary | ICD-10-CM | POA: Diagnosis not present

## 2019-09-20 DIAGNOSIS — O24419 Gestational diabetes mellitus in pregnancy, unspecified control: Secondary | ICD-10-CM

## 2019-09-20 DIAGNOSIS — F439 Reaction to severe stress, unspecified: Secondary | ICD-10-CM

## 2019-09-20 DIAGNOSIS — Z3480 Encounter for supervision of other normal pregnancy, unspecified trimester: Secondary | ICD-10-CM

## 2019-09-20 DIAGNOSIS — O26893 Other specified pregnancy related conditions, third trimester: Secondary | ICD-10-CM

## 2019-09-20 LAB — GLUCOSE, POCT (MANUAL RESULT ENTRY): POC Glucose: 98 mg/dl (ref 70–99)

## 2019-09-20 MED ORDER — ZOLPIDEM TARTRATE 10 MG PO TABS: ORAL_TABLET | ORAL | 0 refills | Status: DC

## 2019-09-20 NOTE — Progress Notes (Signed)
Pt is here for ROB. [redacted]w[redacted]d.

## 2019-09-20 NOTE — Progress Notes (Signed)
   PRENATAL VISIT NOTE  Subjective:  Sheila Davis is a 38 y.o. [id number] at [redacted]w[redacted]d being seen today for ongoing prenatal care.  She is currently monitored for the following issues for this high-risk pregnancy and has Prediabetes; Supervision of other normal pregnancy, antepartum; History of 2 cesarean sections; Multigravida of advanced maternal age in second trimester; Abdominal hernia without obstruction and without gangrene; Headache in pregnancy, antepartum, second trimester; Nausea and vomiting during pregnancy prior to [redacted] weeks gestation; Alpha thalassemia silent carrier; Abnormal genetic test during pregnancy; Obesity in pregnancy; and Gestational diabetes mellitus on their problem list.  Patient reports stress with difficulty sleeping for 2 months. She stopped her celexa and xanax when she found out that she was pregnant. She denies SI and HI.  Contractions: Irritability. Vag. Bleeding: None.  Movement: Present. Denies leaking of fluid.   The following portions of the patient's history were reviewed and updated as appropriate: allergies, current medications, past family history, past medical history, past social history, past surgical history and problem list.   Objective:   Vitals:   09/20/19 1410  BP: 133/81  Pulse: 98  Weight: 261 lb 12.8 oz (118.8 kg)    Fetal Status: Fetal Heart Rate (bpm): 148   Movement: Present     General:  Alert, oriented and cooperative. Patient is in no acute distress.  Skin: Skin is warm and dry. No rash noted.   Cardiovascular: Normal heart rate noted  Respiratory: Normal respiratory effort, no problems with respiration noted  Abdomen: Soft, gravid, appropriate for gestational age.  Pain/Pressure: Present     Pelvic: Cervical exam deferred        Extremities: Normal range of motion.  Edema: None  Mental Status: Normal mood and affect. Normal behavior. Normal judgment and thought content.  Random sugar today is 98  Assessment and Plan:  Pregnancy:  [id number] at [redacted]w[redacted]d 1. Supervision of other normal pregnancy, antepartum - TDAP and flu vaccines today - MFM u/s next week  2. Gestational diabetes mellitus (GDM) in third trimester, gestational diabetes method of control unspecified - She has not gone to the teaching, she says that she already knows how to do this - she has changed her eating habits - she has a glucometer, checking fastings and prior to going to sleep at night - I have rec'd that she check 2 hour post meals also - Hemoglobin A1c  3. Stress- refer to integrated b med  4. Insomnia- she has tried Tylenol PM and Unisom, and melatonin without help. I will prescribe 5 mg ambien qhs prn.   Preterm labor symptoms and general obstetric precautions including but not limited to vaginal bleeding, contractions, leaking of fluid and fetal movement were reviewed in detail with the patient. Please refer to After Visit Summary for other counseling recommendations.   No follow-ups on file.  Future Appointments  Date Time Provider [place]  09/27/2019  1:15 PM WH-MFC [place] 4 WH-MFCUS M[facility]-US    Emily Filbert, MD

## 2019-09-21 ENCOUNTER — Telehealth: Payer: Self-pay | Admitting: *Deleted

## 2019-09-21 LAB — HEMOGLOBIN A1C
Est. average glucose Bld gHb Est-mCnc: 128 mg/dL
Hgb A1c MFr Bld: 6.1 % — ABNORMAL HIGH (ref 4.8–5.6)

## 2019-09-21 NOTE — Telephone Encounter (Signed)
Spoke with pt regarding her Ambien Rx.  Pt states pharmacy told her it needs to have a PA.  Pt made aware that her ins will only cover a quantity of 15. Call placed to pharmacy and made them aware that they may change dispense quantity to 15 for coverage and to have remaining 15 on file for future fill.  Pharmacy states that is approved and will file remaining.  Pt has been made aware.

## 2019-09-22 NOTE — BH Specialist Note (Signed)
Pt did not arrive to video visit anddid not answer the phone; voicemail is full and cannot leave voice message; ; left MyChart message for patient.    Sheila Davis Tamario Heal

## 2019-09-27 ENCOUNTER — Ambulatory Visit (HOSPITAL_COMMUNITY)
Admission: RE | Admit: 2019-09-27 | Discharge: 2019-09-27 | Disposition: A | Discharge status: Home / Self Care | Payer: Medicaid Other | Source: Ambulatory Visit | Attending: Obstetrics and Gynecology | Admitting: Obstetrics and Gynecology

## 2019-09-27 ENCOUNTER — Other Ambulatory Visit (HOSPITAL_COMMUNITY): Payer: Self-pay | Admitting: *Deleted

## 2019-09-27 ENCOUNTER — Other Ambulatory Visit: Payer: Self-pay

## 2019-09-27 DIAGNOSIS — Z3A33 33 weeks gestation of pregnancy: Secondary | ICD-10-CM

## 2019-09-27 DIAGNOSIS — O24410 Gestational diabetes mellitus in pregnancy, diet controlled: Secondary | ICD-10-CM

## 2019-09-27 DIAGNOSIS — O99213 Obesity complicating pregnancy, third trimester: Secondary | ICD-10-CM

## 2019-09-27 DIAGNOSIS — O09523 Supervision of elderly multigravida, third trimester: Secondary | ICD-10-CM

## 2019-09-27 DIAGNOSIS — O34219 Maternal care for unspecified type scar from previous cesarean delivery: Secondary | ICD-10-CM

## 2019-09-27 DIAGNOSIS — O99343 Other mental disorders complicating pregnancy, third trimester: Secondary | ICD-10-CM

## 2019-10-03 ENCOUNTER — Inpatient Hospital Stay (HOSPITAL_COMMUNITY)
Admission: AD | Admit: 2019-10-03 | Discharge: 2019-10-03 | Disposition: A | Discharge status: Home / Self Care | Payer: Medicaid Other | Attending: Obstetrics and Gynecology | Admitting: Obstetrics and Gynecology

## 2019-10-03 ENCOUNTER — Telehealth: Payer: Self-pay | Admitting: Women's Health

## 2019-10-03 ENCOUNTER — Other Ambulatory Visit: Payer: Self-pay

## 2019-10-03 ENCOUNTER — Encounter (HOSPITAL_COMMUNITY): Payer: Self-pay | Admitting: Obstetrics and Gynecology

## 2019-10-03 ENCOUNTER — Telehealth (INDEPENDENT_AMBULATORY_CARE_PROVIDER_SITE_OTHER): Payer: Medicaid Other | Admitting: Advanced Practice Midwife

## 2019-10-03 VITALS — BP 140/96 | HR 102 | Temp 98.9°F

## 2019-10-03 DIAGNOSIS — Z87891 Personal history of nicotine dependence: Secondary | ICD-10-CM | POA: Insufficient documentation

## 2019-10-03 DIAGNOSIS — O163 Unspecified maternal hypertension, third trimester: Secondary | ICD-10-CM

## 2019-10-03 DIAGNOSIS — O26893 Other specified pregnancy related conditions, third trimester: Secondary | ICD-10-CM | POA: Diagnosis not present

## 2019-10-03 DIAGNOSIS — D649 Anemia, unspecified: Secondary | ICD-10-CM | POA: Diagnosis not present

## 2019-10-03 DIAGNOSIS — O99210 Obesity complicating pregnancy, unspecified trimester: Secondary | ICD-10-CM

## 2019-10-03 DIAGNOSIS — R0600 Dyspnea, unspecified: Secondary | ICD-10-CM

## 2019-10-03 DIAGNOSIS — O98513 Other viral diseases complicating pregnancy, third trimester: Secondary | ICD-10-CM | POA: Insufficient documentation

## 2019-10-03 DIAGNOSIS — O4703 False labor before 37 completed weeks of gestation, third trimester: Secondary | ICD-10-CM

## 2019-10-03 DIAGNOSIS — G5600 Carpal tunnel syndrome, unspecified upper limb: Secondary | ICD-10-CM

## 2019-10-03 DIAGNOSIS — Z3689 Encounter for other specified antenatal screening: Secondary | ICD-10-CM

## 2019-10-03 DIAGNOSIS — O26899 Other specified pregnancy related conditions, unspecified trimester: Secondary | ICD-10-CM

## 2019-10-03 DIAGNOSIS — J069 Acute upper respiratory infection, unspecified: Secondary | ICD-10-CM | POA: Diagnosis not present

## 2019-10-03 DIAGNOSIS — Z3A34 34 weeks gestation of pregnancy: Secondary | ICD-10-CM

## 2019-10-03 DIAGNOSIS — E669 Obesity, unspecified: Secondary | ICD-10-CM | POA: Diagnosis not present

## 2019-10-03 DIAGNOSIS — R0609 Other forms of dyspnea: Secondary | ICD-10-CM

## 2019-10-03 DIAGNOSIS — O99213 Obesity complicating pregnancy, third trimester: Secondary | ICD-10-CM

## 2019-10-03 DIAGNOSIS — R0981 Nasal congestion: Secondary | ICD-10-CM | POA: Diagnosis not present

## 2019-10-03 DIAGNOSIS — Z3480 Encounter for supervision of other normal pregnancy, unspecified trimester: Secondary | ICD-10-CM

## 2019-10-03 DIAGNOSIS — O479 False labor, unspecified: Secondary | ICD-10-CM

## 2019-10-03 DIAGNOSIS — U071 COVID-19: Secondary | ICD-10-CM | POA: Insufficient documentation

## 2019-10-03 DIAGNOSIS — O99013 Anemia complicating pregnancy, third trimester: Secondary | ICD-10-CM | POA: Diagnosis not present

## 2019-10-03 DIAGNOSIS — O99513 Diseases of the respiratory system complicating pregnancy, third trimester: Secondary | ICD-10-CM | POA: Insufficient documentation

## 2019-10-03 DIAGNOSIS — R481 Agnosia: Secondary | ICD-10-CM

## 2019-10-03 DIAGNOSIS — R102 Pelvic and perineal pain: Secondary | ICD-10-CM | POA: Diagnosis not present

## 2019-10-03 DIAGNOSIS — Z880 Allergy status to penicillin: Secondary | ICD-10-CM | POA: Insufficient documentation

## 2019-10-03 LAB — CBC WITH DIFFERENTIAL/PLATELET
Abs Immature Granulocytes: 0.02 10*3/uL (ref 0.00–0.07)
Basophils Absolute: 0 10*3/uL (ref 0.0–0.1)
Basophils Relative: 1 %
Eosinophils Absolute: 0 10*3/uL (ref 0.0–0.5)
Eosinophils Relative: 1 %
HCT: 33.9 % — ABNORMAL LOW (ref 36.0–46.0)
Hemoglobin: 10.7 g/dL — ABNORMAL LOW (ref 12.0–15.0)
Immature Granulocytes: 0 %
Lymphocytes Relative: 22 %
Lymphs Abs: 1.4 10*3/uL (ref 0.7–4.0)
MCH: 27.2 pg (ref 26.0–34.0)
MCHC: 31.6 g/dL (ref 30.0–36.0)
MCV: 86 fL (ref 80.0–100.0)
Monocytes Absolute: 0.8 10*3/uL (ref 0.1–1.0)
Monocytes Relative: 12 %
Neutro Abs: 4.3 10*3/uL (ref 1.7–7.7)
Neutrophils Relative %: 64 %
Platelets: 313 10*3/uL (ref 150–400)
RBC: 3.94 MIL/uL (ref 3.87–5.11)
RDW: 14.3 % (ref 11.5–15.5)
WBC: 6.5 10*3/uL (ref 4.0–10.5)
nRBC: 0 % (ref 0.0–0.2)

## 2019-10-03 LAB — COMPREHENSIVE METABOLIC PANEL
ALT: 16 U/L (ref 0–44)
AST: 18 U/L (ref 15–41)
Albumin: 2.4 g/dL — ABNORMAL LOW (ref 3.5–5.0)
Alkaline Phosphatase: 111 U/L (ref 38–126)
Anion gap: 11 (ref 5–15)
BUN: <5 mg/dL — ABNORMAL LOW (ref 6–20)
CO2: 19 mmol/L — ABNORMAL LOW (ref 22–32)
Calcium: 9 mg/dL (ref 8.9–10.3)
Chloride: 107 mmol/L (ref 98–111)
Creatinine, Ser: 0.76 mg/dL (ref 0.44–1.00)
GFR calc Af Amer: >60 mL/min (ref >60)
GFR calc non Af Amer: >60 mL/min (ref >60)
Glucose, Bld: 99 mg/dL (ref 70–99)
Potassium: 3.4 mmol/L — ABNORMAL LOW (ref 3.5–5.1)
Sodium: 137 mmol/L (ref 135–145)
Total Bilirubin: 0.5 mg/dL (ref 0.3–1.2)
Total Protein: 6.2 g/dL — ABNORMAL LOW (ref 6.5–8.1)

## 2019-10-03 LAB — FETAL FIBRONECTIN: Fetal Fibronectin: NEGATIVE

## 2019-10-03 LAB — PROTEIN / CREATININE RATIO, URINE
Creatinine, Urine: 112.44 mg/dL
Protein Creatinine Ratio: 0.27 mg/mg{Cre} — ABNORMAL HIGH (ref 0.00–0.15)
Total Protein, Urine: 30 mg/dL

## 2019-10-03 LAB — SARS CORONAVIRUS 2 (TAT 6-24 HRS): SARS Coronavirus 2: POSITIVE — AB

## 2019-10-03 MED ORDER — FERROUS SULFATE 325 (65 FE) MG PO TABS: 325.0000 mg | ORAL_TABLET | Freq: Every day | ORAL | 3 refills | Status: DC

## 2019-10-03 NOTE — Progress Notes (Signed)
TELEHEALTH VIRTUAL OBSTETRICS VISIT ENCOUNTER NOTE  I connected with Sheila Davis on 10/03/19 at  9:35 AM EST by telephone at home and verified that I am speaking with the correct person using two identifiers.  Attempted to connect pt via video visit with MyChart but unsuccessful so continued visit via telephone today.   I discussed the limitations, risks, security and privacy concerns of performing an evaluation and management service by telephone and the availability of in person appointments. I also discussed with the patient that there may be a patient responsible charge related to this service. The patient expressed understanding and agreed to proceed.  Subjective:  Sheila Davis is a 38 y.o. [id number] at [redacted]w[redacted]d being followed for ongoing prenatal care.  She is currently monitored for the following issues for this low-risk pregnancy and has Prediabetes; Supervision of other normal pregnancy, antepartum; History of 2 cesarean sections; Multigravida of advanced maternal age in second trimester; Abdominal hernia without obstruction and without gangrene; Headache in pregnancy, antepartum, second trimester; Nausea and vomiting during pregnancy prior to [redacted] weeks gestation; Alpha thalassemia silent carrier; Abnormal genetic test during pregnancy; Obesity in pregnancy; and Gestational diabetes mellitus on their problem list.  Patient reports multiple complaints including respiratory symptoms, contractions, wrist numbness/pain, and elevated BP. Reports fetal movement. Denies any contractions, bleeding or leaking of fluid.   The following portions of the patient's history were reviewed and updated as appropriate: allergies, current medications, past family history, past medical history, past social history, past surgical history and problem list.   Objective:   General:  Alert, oriented and cooperative.   Mental Status: Normal mood and affect perceived. Normal judgment and thought content.  Rest of  physical exam deferred due to type of encounter  Assessment and Plan:  Pregnancy: [id number] at [redacted]w[redacted]d 1. Obesity in pregnancy --Declined baby ASA due to hx bleeding ulcer  2. Supervision of other normal pregnancy, antepartum --Pt reports good fetal movement, denies LOF, or vaginal bleeding   3. Hypertension affecting pregnancy in third trimester --HTN today without diagnosis, no hx HTN  4. Complaint of nasal congestion --With HTN at home today and COVID 19 s/sx, will send pt to MAU for evaluation.    5. Dyspnea on exertion --Some present due to pregnancy but worsening with nasal and chest congestion  6. Braxton Hicks contractions --Reports painful contractions today but irregular  7. Loss of perception for taste --x 1 week, negative rapid test for COVID on 12/26 but symptoms persist  8. Carpal tunnel syndrome during pregnancy --Tingling/numbness in hands most likely carpal tunnel. After evaluation in MAU today, consider Rx for wrist braces. Discussed use of ice to treat discomfort. Reassurance provided.  Preterm labor symptoms and general obstetric precautions including but not limited to vaginal bleeding, contractions, leaking of fluid and fetal movement were reviewed in detail with the patient.  I discussed the assessment and treatment plan with the patient. The patient was provided an opportunity to ask questions and all were answered. The patient agreed with the plan and demonstrated an understanding of the instructions. The patient was advised to call back or seek an in-person office evaluation/go to [facility]U at [facility] for any urgent or concerning symptoms. Please refer to After Visit Summary for other counseling recommendations.   I provided 10 minutes of non-face-to-face time during this encounter.  No follow-ups on file.  Future Appointments  Date Time Provider [place]  10/04/2019  1:30 PM Truett Mainland, DO [facility] None  10/05/2019  1:45 PM  [facility]-BEHAVIORAL HEALTH CLINICIAN [facility] WOC  10/25/2019  3:00 PM WH-M[facility] NURSE [facility]FC [facility]-US  10/25/2019  3:00 PM WH-MFC [place] 3 WH-MFCUS MFC-US    Sharen Counter, CNM [facility] for [employer], [facility]

## 2019-10-03 NOTE — Telephone Encounter (Signed)
Attempted to call patient re: COVID+ test results. No answer at patient's number and pt's mailbox full and cannot accept voicemails at this time. Attempted to call sister's number also listed in chart. Sister states she is not in at this time and will have patient return our phone call.  Clarisa Fling, NP  6:22 PM 10/03/2019

## 2019-10-03 NOTE — Discharge Instructions (Signed)
Pregnancy and Anemia ° °Anemia is a condition in which the concentration of red blood cells, or hemoglobin, in the blood is below normal. Hemoglobin is a substance in red blood cells that carries oxygen to the tissues of the body. Anemia results when enough oxygen does not reach these tissues. °Anemia is common during pregnancy because the woman's body needs more blood volume and blood cells to provide nutrition to the fetus. The fetus needs iron and folic acid as it is developing. Your body may not produce enough red blood cells because of this. Also, during pregnancy, the liquid part of the blood (plasma) increases by about 30-50%, and the red blood cells increase by only 20%. This lowers the concentration of the red blood cells and creates a natural anemia-like situation. °What are the causes? °The most common cause of anemia during pregnancy is not having enough iron in the body to make red blood cells (iron deficiency anemia). Other causes may include: °· Folic acid deficiency. °· Vitamin B12 deficiency. °· Certain prescription or over-the-counter medicines. °· Certain medical conditions or infections that destroy red blood cells. °· A low platelet count and bleeding caused by antibodies that go through the placenta to the fetus from the mother’s blood. °What are the signs or symptoms? °Mild anemia may not be noticeable. If it becomes severe, symptoms may include: °· Feeling tired (fatigue). °· Shortness of breath, especially during activity. °· Weakness. °· Fainting. °· Pale looking skin. °· Headaches. °· A fast or irregular heartbeat (palpitations). °· Dizziness. °How is this diagnosed? °This condition may be diagnosed based on: °· Your medical history and a physical exam. °· Blood tests. °How is this treated? °Treatment for anemia during pregnancy depends on the cause of the anemia. Treatment can include: °· Dietary changes. °· Supplements of iron, vitamin B12, or folic acid. °· A blood transfusion. This may  be needed if anemia is severe. °· Hospitalization. This may be needed if there is a lot of blood loss or severe anemia. °Follow these instructions at home: °· Follow recommendations from your dietitian or health care provider about changing your diet. °· Increase your vitamin C intake. This will help the stomach absorb more iron. Some foods that are high in vitamin C include: °? Oranges. °? Peppers. °? Tomatoes. °? Mangoes. °· Eat a diet rich in iron. This would include foods such as: °? Liver. °? Beef. °? Eggs. °? Whole grains. °? Spinach. °? Dried fruit. °· Take iron and vitamins as told by your health care provider. °· Eat green leafy vegetables. These are a good source of folic acid. °· Keep all follow-up visits as told by your health care provider. This is important. °Contact a health care provider if: °· You have frequent or lasting headaches. °· You look pale. °· You bruise easily. °Get help right away if: °· You have extreme weakness, shortness of breath, or chest pain. °· You become dizzy or have trouble concentrating. °· You have heavy vaginal bleeding. °· You develop a rash. °· You have bloody or black, tarry stools. °· You faint. °· You vomit up blood. °· You vomit repeatedly. °· You have abdominal pain. °· You have a fever. °· You are dehydrated. °Summary °· Anemia is a condition in which the concentration of red blood cells or hemoglobin in the blood is below normal. °· Anemia is common during pregnancy because the woman's body needs more blood volume and blood cells to provide nutrition to the fetus. °· The most   common cause of anemia during pregnancy is not having enough iron in the body to make red blood cells (iron deficiency anemia).  Mild anemia may not be noticeable. If it becomes severe, symptoms may include feeling tired and weak. This information is not intended to replace advice given to you by your health care provider. Make sure you discuss any questions you have with your health care  provider. Document Released: 09/19/2000 Document Revised: 01/14/2019 Document Reviewed: 10/28/2016 Elsevier Patient Education  2020 [employer] Inc[employer]   Back Pain in Pregnancy Back pain during pregnancy is common. Back pain may be caused by several factors that are related to changes during your pregnancy. Follow these instructions at home: Managing pain, stiffness, and swelling      If directed, for sudden (acute) back pain, put ice on the painful area. ? Put ice in a plastic bag. ? Place a towel between your skin and the bag. ? Leave the ice on for 20 minutes, 2-3 times per day.  If directed, apply heat to the affected area before you exercise. Use the heat source that your health care provider recommends, such as a moist heat pack or a heating pad. ? Place a towel between your skin and the heat source. ? Leave the heat on for 20-30 minutes. ? Remove the heat if your skin turns bright red. This is especially important if you are unable to feel pain, heat, or cold. You may have a greater risk of getting burned.  If directed, massage the affected area. Activity  Exercise as told by your health care provider. Gentle exercise is the best way to prevent or manage back pain.  Listen to your body when lifting. If lifting hurts, ask for help or bend your knees. This uses your leg muscles instead of your back muscles.  Squat down when picking up something from the floor. Do not bend over.  Only use bed rest for short periods as told by your health care provider. Bed rest should only be used for the most severe episodes of back pain. Standing, sitting, and lying down  Do not stand in one place for long periods of time.  Use good posture when sitting. Make sure your head rests over your shoulders and is not hanging forward. Use a pillow on your lower back if necessary.  Try sleeping on your side, preferably the left side, with a pregnancy support pillow or 1-2 regular pillows between your  legs. ? If you have back pain after a night's rest, your bed may be too soft. ? A firm mattress may provide more support for your back during pregnancy. General instructions  Do not wear high heels.  Eat a healthy diet. Try to gain weight within your health care provider's recommendations.  Use a maternity girdle, elastic sling, or back brace as told by your health care provider.  Take over-the-counter and prescription medicines only as told by your health care provider.  Work with a physical therapist or massage therapist to find ways to manage back pain. Acupuncture or massage therapy may be helpful.  Keep all follow-up visits as told by your health care provider. This is important. Contact a health care provider if:  Your back pain interferes with your daily activities.  You have increasing pain in other parts of your body. Get help right away if:  You develop numbness, tingling, weakness, or problems with the use of your arms or legs.  You develop severe back pain that is not controlled  with medicine.  You have a change in bowel or bladder control.  You develop shortness of breath, dizziness, or you faint.  You develop nausea, vomiting, or sweating.  You have back pain that is a rhythmic, cramping pain similar to labor pains. Labor pain is usually 1-2 minutes apart, lasts for about 1 minute, and involves a bearing down feeling or pressure in your pelvis.  You have back pain and your water breaks or you have vaginal bleeding.  You have back pain or numbness that travels down your leg.  Your back pain developed after you fell.  You develop pain on one side of your back.  You see blood in your urine.  You develop skin blisters in the area of your back pain. Summary  Back pain may be caused by several factors that are related to changes during your pregnancy.  Follow instructions as told by your health care provider for managing pain, stiffness, and  swelling.  Exercise as told by your health care provider. Gentle exercise is the best way to prevent or manage back pain.  Take over-the-counter and prescription medicines only as told by your health care provider.  Keep all follow-up visits as told by your health care provider. This is important. This information is not intended to replace advice given to you by your health care provider. Make sure you discuss any questions you have with your health care provider. Document Released: 12/31/2005 Document Revised: 01/11/2019 Document Reviewed: 03/10/2018 Elsevier Patient Education  2020 Elsevier Inc.   Abdominal Pain During Pregnancy  Abdominal pain is common during pregnancy, and has many possible causes. Some causes are more serious than others, and sometimes the cause is not known. Abdominal pain can be a sign that labor is starting. It can also be caused by normal growth and stretching of muscles and ligaments during pregnancy. Always tell your health care provider if you have any abdominal pain. Follow these instructions at home:  Do not have sex or put anything in your vagina until your pain goes away completely.  Get plenty of rest until your pain improves.  Drink enough fluid to keep your urine pale yellow.  Take over-the-counter and prescription medicines only as told by your health care provider.  Keep all follow-up visits as told by your health care provider. This is important. Contact a health care provider if:  Your pain continues or gets worse after resting.  You have lower abdominal pain that: ? Comes and goes at regular intervals. ? Spreads to your back. ? Is similar to menstrual cramps.  You have pain or burning when you urinate. Get help right away if:  You have a fever or chills.  You have vaginal bleeding.  You are leaking fluid from your vagina.  You are passing tissue from your vagina.  You have vomiting or diarrhea that lasts for more than 24  hours.  Your baby is moving less than usual.  You feel very weak or faint.  You have shortness of breath.  You develop severe pain in your upper abdomen. Summary  Abdominal pain is common during pregnancy, and has many possible causes.  If you experience abdominal pain during pregnancy, tell your health care provider right away.  Follow your health care provider's home care instructions and keep all follow-up visits as directed. This information is not intended to replace advice given to you by your health care provider. Make sure you discuss any questions you have with your health care provider. Document  Released: 09/22/2005 Document Revised: 01/10/2019 Document Reviewed: 12/25/2016 Elsevier Patient Education  2020 [employer].  Preterm Labor and Birth Information  The normal length of a pregnancy is 39-41 weeks. Preterm labor is when labor starts before 37 completed weeks of pregnancy. What are the risk factors for preterm labor? Preterm labor is more likely to occur in women who:  Have certain infections during pregnancy such as a bladder infection, sexually transmitted infection, or infection inside the uterus (chorioamnionitis).  Have a shorter-than-normal cervix.  Have gone into preterm labor before.  Have had surgery on their cervix.  Are younger than age 74 or older than age 56.  Are African American.  Are pregnant with twins or multiple babies (multiple gestation).  Take street drugs or smoke while pregnant.  Do not gain enough weight while pregnant.  Became pregnant shortly after having been pregnant. What are the symptoms of preterm labor? Symptoms of preterm labor include:  Cramps similar to those that can happen during a menstrual period. The cramps may happen with diarrhea.  Pain in the abdomen or lower back.  Regular uterine contractions that may feel like tightening of the abdomen.  A feeling of increased pressure in the pelvis.  Increased  watery or bloody mucus discharge from the vagina.  Water breaking (ruptured amniotic sac). Why is it important to recognize signs of preterm labor? It is important to recognize signs of preterm labor because babies who are born prematurely may not be fully developed. This can put them at an increased risk for:  Long-term (chronic) heart and lung problems.  Difficulty immediately after birth with regulating body systems, including blood sugar, body temperature, heart rate, and breathing rate.  Bleeding in the brain.  Cerebral palsy.  Learning difficulties.  Death. These risks are highest for babies who are born before 34 weeks of pregnancy. How is preterm labor treated? Treatment depends on the length of your pregnancy, your condition, and the health of your baby. It may involve:  Having a stitch (suture) placed in your cervix to prevent your cervix from opening too early (cerclage).  Taking or being given medicines, such as: ? Hormone medicines. These may be given early in pregnancy to help support the pregnancy. ? Medicine to stop contractions. ? Medicines to help mature the baby's lungs. These may be prescribed if the risk of delivery is high. ? Medicines to prevent your baby from developing cerebral palsy. If the labor happens before 34 weeks of pregnancy, you may need to stay in the hospital. What should I do if I think I am in preterm labor? If you think that you are going into preterm labor, call your health care provider right away. How can I prevent preterm labor in future pregnancies? To increase your chance of having a full-term pregnancy:  Do not use any tobacco products, such as cigarettes, chewing tobacco, and e-cigarettes. If you need help quitting, ask your health care provider.  Do not use street drugs or medicines that have not been prescribed to you during your pregnancy.  Talk with your health care provider before taking any herbal supplements, even if you  have been taking them regularly.  Make sure you gain a healthy amount of weight during your pregnancy.  Watch for infection. If you think that you might have an infection, get it checked right away.  Make sure to tell your health care provider if you have gone into preterm labor before. This information is not intended to replace advice given  to you by your health care provider. Make sure you discuss any questions you have with your health care provider. Document Released: 12/13/2003 Document Revised: 01/14/2019 Document Reviewed: 02/13/2016 Elsevier Patient Education  [place]. Preeclampsia and Eclampsia Preeclampsia is a serious condition that may develop during pregnancy. This condition causes high blood pressure and increased protein in your urine along with other symptoms, such as headaches and vision changes. These symptoms may develop as the condition gets worse. Preeclampsia may occur at 20 weeks of pregnancy or later. Diagnosing and treating preeclampsia early is very important. If not treated early, it can cause serious problems for you and your baby. One problem it can lead to is eclampsia. Eclampsia is a condition that causes muscle jerking or shaking (convulsions or seizures) and other serious problems for the mother. During pregnancy, delivering your baby may be the best treatment for preeclampsia or eclampsia. For most women, preeclampsia and eclampsia symptoms go away after giving birth. In rare cases, a woman may develop preeclampsia after giving birth (postpartum preeclampsia). This usually occurs within 48 hours after childbirth but may occur up to 6 weeks after giving birth. What are the causes? The cause of preeclampsia is not known. What increases the risk? The following risk factors make you more likely to develop preeclampsia:  Being pregnant for the first time.  Having had preeclampsia during a past pregnancy.  Having a family history of  preeclampsia.  Having high blood pressure.  Being pregnant with more than one baby.  Being 75 or older.  Being African-American.  Having kidney disease or diabetes.  Having medical conditions such as lupus or blood diseases.  Being very overweight (obese). What are the signs or symptoms? The most common symptoms are:  Severe headaches.  Vision problems, such as blurred or double vision.  Abdominal pain, especially upper abdominal pain. Other symptoms that may develop as the condition gets worse include:  Sudden weight gain.  Sudden swelling of the hands, face, legs, and feet.  Severe nausea and vomiting.  Numbness in the face, arms, legs, and feet.  Dizziness.  Urinating less than usual.  Slurred speech.  Convulsions or seizures. How is this diagnosed? There are no screening tests for preeclampsia. Your health care provider will ask you about symptoms and check for signs of preeclampsia during your prenatal visits. You may also have tests that include:  Checking your blood pressure.  Urine tests to check for protein. Your health care provider will check for this at every prenatal visit.  Blood tests.  Monitoring your baby's heart rate.  Ultrasound. How is this treated? You and your health care provider will determine the treatment approach that is best for you. Treatment may include:  Having more frequent prenatal exams to check for signs of preeclampsia, if you have an increased risk for preeclampsia.  Medicine to lower your blood pressure.  Staying in the hospital, if your condition is severe. There, treatment will focus on controlling your blood pressure and the amount of fluids in your body (fluid retention).  Taking medicine (magnesium sulfate) to prevent seizures. This may be given as an injection or through an IV.  Taking a low-dose aspirin during your pregnancy.  Delivering your baby early. You may have your labor started with medicine  (induced), or you may have a cesarean delivery. Follow these instructions at home: Eating and drinking   Drink enough fluid to keep your urine pale yellow.  Avoid caffeine. Lifestyle  Do not use any products  that contain nicotine or tobacco, such as cigarettes and e-cigarettes. If you need help quitting, ask your health care provider.  Do not use alcohol or drugs.  Avoid stress as much as possible. Rest and get plenty of sleep. General instructions  Take over-the-counter and prescription medicines only as told by your health care provider.  When lying down, lie on your left side. This keeps pressure off your major blood vessels.  When sitting or lying down, raise (elevate) your feet. Try putting some pillows underneath your lower legs.  Exercise regularly. Ask your health care provider what kinds of exercise are best for you.  Keep all follow-up and prenatal visits as told by your health care provider. This is important. How is this prevented? There is no known way of preventing preeclampsia or eclampsia from developing. However, to lower your risk of complications and detect problems early:  Get regular prenatal care. Your health care provider may be able to diagnose and treat the condition early.  Maintain a healthy weight. Ask your health care provider for help managing weight gain during pregnancy.  Work with your health care provider to manage any long-term (chronic) health conditions you have, such as diabetes or kidney problems.  You may have tests of your blood pressure and kidney function after giving birth.  Your health care provider may have you take low-dose aspirin during your next pregnancy. Contact a health care provider if:  You have symptoms that your health care provider told you may require more treatment or monitoring, such as: ? Headaches. ? Nausea or vomiting. ? Abdominal pain. ? Dizziness. ? Light-headedness. Get help right away if:  You have  severe: ? Abdominal pain. ? Headaches that do not get better. ? Dizziness. ? Vision problems. ? Confusion. ? Nausea or vomiting.  You have any of the following: ? A seizure. ? Sudden, rapid weight gain. ? Sudden swelling in your hands, ankles, or face. ? Trouble moving any part of your body. ? Numbness in any part of your body. ? Trouble speaking. ? Abnormal bleeding.  You faint. Summary  Preeclampsia is a serious condition that may develop during pregnancy.  This condition causes high blood pressure and increased protein in your urine along with other symptoms, such as headaches and vision changes.  Diagnosing and treating preeclampsia early is very important. If not treated early, it can cause serious problems for you and your baby.  Get help right away if you have symptoms that your health care provider told you to watch for. This information is not intended to replace advice given to you by your health care provider. Make sure you discuss any questions you have with your health care provider. Document Released: 09/19/2000 Document Revised: 05/25/2018 Document Reviewed: 04/28/2016 Elsevier Patient Education  2020 [employer]                  Safe Medications in Pregnancy    Acne: Benzoyl Peroxide Salicylic Acid  Backache/Headache: Tylenol: 2 regular strength every 4 hours OR              2 Extra strength every 6 hours  Colds/Coughs/Allergies: Benadryl (alcohol free) 25 mg every 6 hours as needed Breath right strips Claritin Cepacol throat lozenges Chloraseptic throat spray Cold-Eeze- up to three times per day Cough drops, alcohol free Flonase (by prescription only) Guaifenesin Mucinex Robitussin DM (plain only, alcohol free) Saline nasal spray/drops Sudafed (pseudoephedrine) & Actifed ** use only after [redacted] weeks gestation and if you do  not have high blood pressure Tylenol Vicks Vaporub Zinc lozenges Zyrtec   Constipation: Colace Ducolax  suppositories Fleet enema Glycerin suppositories Metamucil Milk of magnesia Miralax Senokot Smooth move tea  Diarrhea: Kaopectate Imodium A-D  *NO pepto Bismol  Hemorrhoids: Anusol Anusol HC Preparation H Tucks  Indigestion: Tums Maalox Mylanta Zantac  Pepcid  Insomnia: Benadryl (alcohol free)  every 6 hours as needed Tylenol PM Unisom, no Gelcaps  Leg Cramps: Tums MagGel  Nausea/Vomiting:  Bonine Dramamine Emetrol Ginger extract Sea bands Meclizine  Nausea medication to take during pregnancy:  Unisom (doxylamine succinate 25 mg tablets) Take one tablet daily at bedtime. If symptoms are not adequately controlled, the dose can be increased to a maximum recommended dose of two tablets daily (1/2 tablet in the morning, 1/2 tablet mid-afternoon and one at bedtime). Vitamin B6  tablets. Take one tablet twice a day (up to 200 mg per day).  Skin Rashes: Aveeno products Benadryl cream or  every 6 hours as needed Calamine Lotion 1% cortisone cream  Yeast infection: Gyne-lotrimin 7 Monistat 7   **If taking multiple medications, please check labels to avoid duplicating the same active ingredients **take medication as directed on the label ** Do not exceed 4000 mg of tylenol in 24 hours **Do not take medications that contain aspirin or ibuprofen

## 2019-10-03 NOTE — Telephone Encounter (Signed)
Pt returned phone call and identified by two identifiers. Pt informed of COVID+ status and recommended qurantine for 10 days after onset of symptoms. Pt also advised to notify job and to self-isolate in house. Pt advised that close contacts in household would end their quarantine period 14days after the patient's concludes. Pt advised to present to Urgent Care/ED with worsening symptoms, or to present to ED with SOB/severe pain/fever related to [place]. Discussed implications of COVID in pregnancy. Pt verbalizes understanding. Questions answered to patient satisfaction.  Clarisa Fling, NP  7:14 PM 10/03/2019

## 2019-10-03 NOTE — Progress Notes (Addendum)
I connected with  Sheila Davis on 10/03/19 by a video enabled telemedicine application and verified that I am speaking with the correct person using two identifiers.   I discussed the limitations of evaluation and management by telemedicine. The patient expressed understanding and agreed to proceed.   ROB.  C/o Nasal congestion, no taste,  swollen/painful 5/10 hands ankles, feet, back and abdominal pain 5-10/10.  LOF, discharge. BS 90 this AM.

## 2019-10-03 NOTE — MAU Note (Signed)
"  Been feeling pretty bad,  Pain in lower abd and low back.  White d/c- no odor or itching.  Been congested, pain radiating from facial sinuses to ears.  Been sneezing. Mild sore throat. No real smell or taste. Productive cough. A lot of pain and swelling in hands, sometimes hard to make a fist.  Some swelling in feet." Denies HA, some blurring, denies epigastric pain.  Rapid covid 2 days ago was neg.

## 2019-10-03 NOTE — MAU Provider Note (Addendum)
History     CSN: [id number]  Arrival date and time: 10/03/19 1044   First Provider Initiated Contact with Patient 10/03/19 1146      Chief Complaint  Patient presents with  . Abdominal Pain  . Back Pain  . Nasal Congestion  . Cough  . Sore Throat   Ms. Sheila Davis is a 38 y.o. [id number] at 26w1dwho presents to MAU for COVID sx, pelvic pain/pressure and HBP. Pt had a virtual visit with her office this morning and was sent to MAU for additional evaluation. Pt also reports she had a rapid COVID test done on 09/30/2019 and 10/01/2019 which were both negative. Pt reports she works at [employer] they asked her to get tested for her job and she has been going to work with her symptoms.  COVID Symptoms Onset: 09/29/2019 Location: upper respiratory Duration: 4days, worsened Saturday 09/21/2019 Character: nasal congestion, frontal sinus pressure radiating to ears bilaterally, chest congestion, headache, body aches, sneezing, cough from post-nasal drip Aggravating/Associated: none/none Relieving: none Treatment: Tylenol (last took last night, did not help)  Pt also had elevated BP using home cuff at 144/95. Pt denies HA, blurry vision/seeing spots, N/V, epigastric pain, swelling in face and hands, sudden weight gain. Pt denies chest pain and SOB. Pt reports blurry vision accompanying brief episode of dizziness while at work on Saturday, pt denies any episodes since then.  Pt reports pelvic pain/pressure started 09/29/2019, pt reports pressure pain is in lower abdomen/pelvis radiating to back. 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 Pt denies anything in the vagina in the past 24hrs. Pt reports pelvic pressure/pain has stayed the same since it started. Pt reports she does have a pregnancy support belt at home.  Pt denies VB, LOF, ctx, decreased FM, vaginal discharge/odor/itching. Pt denies N/V, abdominal pain, constipation, diarrhea, or urinary problems. Pt  denies fever, chills, fatigue, sweating or changes in appetite. Pt denies SOB or chest pain. Pt denies dizziness, HA, light-headedness, weakness.  Problems this pregnancy include: gestational diabetes, abdominal hernia Allergies? PCN Current medications/supplements? PNVs, Phenergan PRN, Tylenol PM, Ambien PRN, Zofran PRN, Fioricet PRN, Reglan PRN Prenatal care provider? [facility], next appt 10/04/2019   OB History    Gravida  5   Para  2   Term  2   Preterm      AB  2   Living  2     SAB  2   TAB  0   Ectopic      Multiple      Live Births              Past Medical History:  Diagnosis Date  . Anxiety   . Depression    pcp  . History of stomach ulcers   . Migraines   . Seasonal allergies     Past Surgical History:  Procedure Laterality Date  . CESAREAN SECTION      2002 & 2006    Family History  Problem Relation Age of Onset  . Cancer - Ovarian Mother 678 . Heart disease Mother   . Cancer Mother   . Hypertension Father   . Diabetes Paternal Grandmother     Social History   Tobacco Use  . Smoking status: Former Smoker    Packs/day: 0.20    Years: 0.50    Pack years: 0.10    Types: Cigarettes    Quit date: 09/02/2015    Years since  quitting: 4.0  . Smokeless tobacco: Never Used  Substance Use Topics  . Alcohol use: Not Currently    Alcohol/week: 0.0 standard drinks    Comment: occasionally  . Drug use: No    Allergies:  Allergies  Allergen Reactions  . Penicillins Rash    Has patient had a PCN reaction causing immediate rash, facial/tongue/throat swelling, SOB or lightheadedness with hypotension: no Has patient had a PCN reaction causing severe rash involving mucus membranes or skin necrosis: white puss Has patient had a PCN reaction that required hospitalization no Has patient had a PCN reaction occurring within the last 10 years: yes If all of the above answers are "NO", then may proceed with Cephalosporin use.     No  medications prior to admission.    Review of Systems  Constitutional: Negative for chills, diaphoresis, fatigue and fever.  HENT: Positive for congestion, ear pain, postnasal drip, sinus pressure, sneezing and sore throat. Negative for ear discharge and sinus pain.   Eyes: Negative for visual disturbance.  Respiratory: Positive for cough. Negative for shortness of breath.   Cardiovascular: Negative for chest pain.  Gastrointestinal: Negative for abdominal pain, constipation, diarrhea, nausea and vomiting.  Genitourinary: Positive for pelvic pain. Negative for dysuria, flank pain, frequency, urgency, vaginal bleeding and vaginal discharge.  Musculoskeletal: Positive for back pain.  Neurological: Positive for headaches. Negative for dizziness, weakness and light-headedness.   Physical Exam   Blood pressure 120/73, pulse (!) 108, temperature 98.4 F (36.9 C), temperature source Oral, resp. rate 19, weight 116.7 kg, last menstrual period 02/06/2019, SpO2 99 %.  Patient Vitals for the past 24 hrs:  BP Temp Temp src Pulse Resp SpO2 Weight  10/03/19 1433 - - - - 19 - -  10/03/19 1431 120/73 - - (!) 108 - - -  10/03/19 1416 133/79 - - (!) 110 - - -  10/03/19 1401 128/78 - - 100 - - -  10/03/19 1331 125/60 - - 95 - - -  10/03/19 1312 (!) 143/93 - - (!) 107 - - -  10/03/19 1248 (!) 146/92 - - 93 - - -  10/03/19 1231 138/90 - - (!) 103 - - -  10/03/19 1216 131/90 - - (!) 104 - - -  10/03/19 1201 115/81 - - 95 - - -  10/03/19 1145 115/65 - - 100 - 99 % -  10/03/19 1142 120/72 - - (!) 105 - - -  10/03/19 1101 135/84 98.4 F (36.9 C) Oral (!) 109 20 99 % 116.7 kg  10/03/19 1056 - - - - - 99 % -   Physical Exam  Constitutional: She is oriented to person, place, and time. She appears well-developed and well-nourished. No distress.  HENT:  Head: Normocephalic and atraumatic.  Right Ear: External ear normal.  Left Ear: External ear normal.  Mouth/Throat: Oropharynx is clear and moist. No  oropharyngeal exudate.  Respiratory: Effort normal and breath sounds normal. No respiratory distress. She has no wheezes. She has no rales.  GI: Soft.  Genitourinary: There is no rash, tenderness or lesion on the right labia. There is no rash, tenderness or lesion on the left labia. Cervix exhibits no motion tenderness, no discharge and no friability.    No vaginal discharge, tenderness or bleeding.  No tenderness or bleeding in the vagina.  Neurological: She is alert and oriented to person, place, and time.  Skin: Skin is warm and dry. She is not diaphoretic.  Psychiatric: She has a normal mood and  affect. Her behavior is normal. Judgment and thought content normal.  *Unable to examine ear canals - nonfunctional otoscope in MAU.  Results for orders placed or performed during the hospital encounter of 10/03/19 (from the past 24 hour(s))  Protein / creatinine ratio, urine     Status: Abnormal   Collection Time: 10/03/19 11:01 AM  Result Value Ref Range   Creatinine, Urine 112.44 mg/dL   Total Protein, Urine 30 mg/dL   Protein Creatinine Ratio 0.27 (H) 0.00 - 0.15 mg/mg[Cre]  CBC with Differential/Platelet     Status: Abnormal   Collection Time: 10/03/19 11:13 AM  Result Value Ref Range   WBC 6.5 4.0 - 10.5 K/uL   RBC 3.94 3.87 - 5.11 MIL/uL   Hemoglobin 10.7 (L) 12.0 - 15.0 g/dL   HCT 33.9 (L) 36.0 - 46.0 %   MCV 86.0 80.0 - 100.0 fL   MCH 27.2 26.0 - 34.0 pg   MCHC 31.6 30.0 - 36.0 g/dL   RDW 14.3 11.5 - 15.5 %   Platelets 313 150 - 400 K/uL   nRBC 0.0 0.0 - 0.2 %   Neutrophils Relative % 64 %   Neutro Abs 4.3 1.7 - 7.7 K/uL   Lymphocytes Relative 22 %   Lymphs Abs 1.4 0.7 - 4.0 K/uL   Monocytes Relative 12 %   Monocytes Absolute 0.8 0.1 - 1.0 K/uL   Eosinophils Relative 1 %   Eosinophils Absolute 0.0 0.0 - 0.5 K/uL   Basophils Relative 1 %   Basophils Absolute 0.0 0.0 - 0.1 K/uL   Immature Granulocytes 0 %   Abs Immature Granulocytes 0.02 0.00 - 0.07 K/uL  Comprehensive  metabolic panel     Status: Abnormal   Collection Time: 10/03/19 11:13 AM  Result Value Ref Range   Sodium 137 135 - 145 mmol/L   Potassium 3.4 (L) 3.5 - 5.1 mmol/L   Chloride 107 98 - 111 mmol/L   CO2 19 (L) 22 - 32 mmol/L   Glucose, Bld 99 70 - 99 mg/dL   BUN <5 (L) 6 - 20 mg/dL   Creatinine, Ser 0.76 0.44 - 1.00 mg/dL   Calcium 9.0 8.9 - 10.3 mg/dL   Total Protein 6.2 (L) 6.5 - 8.1 g/dL   Albumin 2.4 (L) 3.5 - 5.0 g/dL   AST 18 15 - 41 U/L   ALT 16 0 - 44 U/L   Alkaline Phosphatase 111 38 - 126 U/L   Total Bilirubin 0.5 0.3 - 1.2 mg/dL   GFR calc non Af Amer >60 >60 mL/min   GFR calc Af Amer >60 >60 mL/min   Anion gap 11 5 - 15  SARS CORONAVIRUS 2 (TAT 6-24 HRS) Nasopharyngeal Nasopharyngeal Swab     Status: Abnormal   Collection Time: 10/03/19 11:45 AM   Specimen: Nasopharyngeal Swab  Result Value Ref Range   SARS Coronavirus 2 POSITIVE (A) NEGATIVE  Fetal fibronectin     Status: None   Collection Time: 10/03/19 12:11 PM  Result Value Ref Range   Fetal Fibronectin NEGATIVE NEGATIVE   MAU Course  Procedures  MDM -COVID vs. URI -suspect viral sinus infection with normal temp and mild symptoms -Rapid COVID negative x2 -lungs clear to auscultation -no antibiotics indicated at this time -COVID test pending  -r/o PTL Dilation: Closed Effacement (%): Thick Cervical Position: [place]: Ballotable Exam by:: Vernice Jefferson, NP  -FFN: negative -EFM: reactive       -baseline: 145       -variability: moderate       -  accels: present, 15x15       -decels: absent       -TOCO: irritability  -preeclampsia evaluation -mostly normal pressures in MAU (two 140s/90s), asymptomatic with exception of HA that patient reports is related to URI symptoms -CBC w/ Diff: WNL, WBCs 6.5, platelets 313, H/H 10.7/33.9, will start on iron -CMP: WNL, AST/ALT 18/16 -PCr: 0.27  -pt discharged to home in stable condition  Orders Placed This Encounter  Procedures  . SARS CORONAVIRUS 2  (TAT 6-24 HRS) Nasopharyngeal Nasopharyngeal Swab    Standing Status:   Standing    Number of Occurrences:   1    Order Specific Question:   Is this test for diagnosis or screening    Answer:   Diagnosis of ill patient    Order Specific Question:   Symptomatic for COVID-19 as defined by CDC    Answer:   Yes    Order Specific Question:   Date of Symptom Onset    Answer:   10/03/2019    Order Specific Question:   Hospitalized for COVID-19    Answer:   Yes    Order Specific Question:   Admitted to ICU for COVID-19    Answer:   No    Order Specific Question:   Previously tested for COVID-19    Answer:   No    Order Specific Question:   Resident in a congregate (group) care setting    Answer:   No    Order Specific Question:   Employed in healthcare setting    Answer:   Yes    Order Specific Question:   Pregnant    Answer:   Yes  . CBC with Differential/Platelet    Standing Status:   Standing    Number of Occurrences:   1  . Comprehensive metabolic panel    Standing Status:   Standing    Number of Occurrences:   1  . Protein / creatinine ratio, urine    Standing Status:   Standing    Number of Occurrences:   1  . Fetal fibronectin    Standing Status:   Standing    Number of Occurrences:   1  . Airborne and Contact precautions    Standing Status:   Standing    Number of Occurrences:   1  . Discharge patient    Order Specific Question:   Discharge disposition    Answer:   01-Home or Self Care [1]    Order Specific Question:   Discharge patient date    Answer:   10/03/2019   Assessment and Plan   1. Upper respiratory tract infection, unspecified type   2. Obesity in pregnancy   3. Supervision of other normal pregnancy, antepartum   4. [redacted] weeks gestation of pregnancy   5. NST (non-stress test) reactive   6. Anemia during pregnancy in third trimester   7. Pelvic pressure in pregnancy    Allergies as of 10/03/2019      Reactions   Penicillins Rash   Has patient had a PCN  reaction causing immediate rash, facial/tongue/throat swelling, SOB or lightheadedness with hypotension: no Has patient had a PCN reaction causing severe rash involving mucus membranes or skin necrosis: white puss Has patient had a PCN reaction that required hospitalization no Has patient had a PCN reaction occurring within the last 10 years: yes If all of the above answers are "NO", then may proceed with Cephalosporin use.      Medication List  TAKE these medications   Accu-Chek FastClix Lancets Misc 1 each by Percutaneous route 4 (four) times daily.   Accu-Chek Guide test strip Generic drug: glucose blood Use 1 test strip to check blood glucose 4 times daily   Blood Pressure Cuff Misc 1 Device by Does not apply route once a week.   Blood Pressure Kit Devi 1 kit by Does not apply route as needed.   buPROPion 100 MG tablet Commonly known as: WELLBUTRIN Take 1 tablet (100 mg total) by mouth 2 (two) times daily.   butalbital-acetaminophen-caffeine 50-325-40 MG tablet Commonly known as: FIORICET Take 1-2 tablets by mouth every 6 (six) hours as needed for headache.   diphenhydramine-acetaminophen 25-500 MG Tabs tablet Commonly known as: TYLENOL PM Take 1 tablet by mouth at bedtime as needed.   Doxylamine-Pyridoxine 10-10 MG Tbec Commonly known as: Diclegis Take 2 tablets by mouth at bedtime. If symptoms persist, add one tablet in the morning and one in the afternoon   ferrous sulfate 325 (65 FE) MG tablet Take 1 tablet (325 mg total) by mouth daily.   metoCLOPramide 10 MG tablet Commonly known as: REGLAN Take 10 mg by mouth 4 (four) times daily.   nystatin cream Commonly known as: MYCOSTATIN Apply 1 application topically 2 (two) times daily.   nystatin-triamcinolone ointment Commonly known as: MYCOLOG Apply 1 application topically 2 (two) times daily.   omeprazole 10 MG capsule Commonly known as: PRILOSEC Take 10 mg by mouth daily.   ondansetron 4 MG  tablet Commonly known as: Zofran Take 1 tablet (4 mg total) by mouth every 8 (eight) hours as needed for nausea or vomiting.   Prenate Mini 18-0.6-0.4-350 MG Caps Take 1 capsule by mouth daily.   promethazine 25 MG tablet Commonly known as: PHENERGAN Take 0.5-1 tablets (12.5-25 mg total) by mouth every 6 (six) hours as needed for nausea.   terconazole 0.8 % vaginal cream Commonly known as: TERAZOL 3 Place 1 applicator vaginally at bedtime.   triamcinolone 0.025 % ointment Commonly known as: KENALOG Apply 1 application topically 2 (two) times daily.   zolpidem 10 MG tablet Commonly known as: AMBIEN Take 5 mg to help with sleep prn      -RX ferrous sulfate -pt to keep appt tomorrow with OB  -list of safe meds in pregnancy given with focus on cold/cough section; pt advised to avoid concurrent medications with with sedating effects -pt advised to seek care in ED/Urgent Care with worsening URI symptoms -use pregnancy support belt as prescribed for relief of pelvic/back pain/pressure -discussed s/sx of preeclampsia, information given -discussed s/sx of PTL, information given -strict return MAU precautions given -pt discharged to home in stable condition  Sheila Davis 10/03/2019, 6:31 PM

## 2019-10-04 ENCOUNTER — Telehealth (INDEPENDENT_AMBULATORY_CARE_PROVIDER_SITE_OTHER): Payer: Medicaid Other | Admitting: Family Medicine

## 2019-10-04 ENCOUNTER — Other Ambulatory Visit: Payer: Self-pay

## 2019-10-04 ENCOUNTER — Encounter: Payer: Self-pay | Admitting: Medical

## 2019-10-04 VITALS — BP 138/78 | HR 89

## 2019-10-04 DIAGNOSIS — O98513 Other viral diseases complicating pregnancy, third trimester: Secondary | ICD-10-CM

## 2019-10-04 DIAGNOSIS — Z3480 Encounter for supervision of other normal pregnancy, unspecified trimester: Secondary | ICD-10-CM

## 2019-10-04 DIAGNOSIS — U071 COVID-19: Secondary | ICD-10-CM

## 2019-10-04 DIAGNOSIS — R030 Elevated blood-pressure reading, without diagnosis of hypertension: Secondary | ICD-10-CM

## 2019-10-04 DIAGNOSIS — Z98891 History of uterine scar from previous surgery: Secondary | ICD-10-CM

## 2019-10-04 DIAGNOSIS — O99210 Obesity complicating pregnancy, unspecified trimester: Secondary | ICD-10-CM

## 2019-10-04 DIAGNOSIS — Z3A34 34 weeks gestation of pregnancy: Secondary | ICD-10-CM

## 2019-10-04 DIAGNOSIS — O99213 Obesity complicating pregnancy, third trimester: Secondary | ICD-10-CM

## 2019-10-04 DIAGNOSIS — O24419 Gestational diabetes mellitus in pregnancy, unspecified control: Secondary | ICD-10-CM

## 2019-10-04 NOTE — Progress Notes (Signed)
S/w patient for virtual visit. Pt reports fetal movement with occasional back pain and pressure. Pt states that last time she checked BG was yesterday before breakfast, and fasting was 90.

## 2019-10-04 NOTE — Progress Notes (Signed)
TELEHEALTH OBSTETRICS PRENATAL VIRTUAL VIDEO VISIT ENCOUNTER NOTE  Provider location: Center for [facility] [place]   I connected with Sheila Davis on 10/04/19 at  1:30 PM EST by MyChart Video Encounter at home and verified that I am speaking with the correct person using two identifiers.   I discussed the limitations, risks, security and privacy concerns of performing an evaluation and management service virtually and the availability of in person appointments. I also discussed with the patient that there may be a patient responsible charge related to this service. The patient expressed understanding and agreed to proceed. Subjective:  Sheila Davis is a 38 y.o. [id number] at [redacted]w[redacted]d being seen today for ongoing prenatal care.  She is currently monitored for the following issues for this high-risk pregnancy and has Prediabetes; Supervision of other normal pregnancy, antepartum; History of 2 cesarean sections; Multigravida of advanced maternal age in second trimester; Abdominal hernia without obstruction and without gangrene; Headache in pregnancy, antepartum, second trimester; Nausea and vomiting during pregnancy prior to [redacted] weeks gestation; Alpha thalassemia silent carrier; Abnormal genetic test during pregnancy; Obesity in pregnancy; Gestational diabetes mellitus; and COVID-19 on their problem list.  Patient reports cough, sore throat, SOB with exertion. Diagnosed with COVID yesterday. Partner hasn't been tested.  Contractions: Not present. Vag. Bleeding: None.  Movement: Present. Denies any leaking of fluid.   The following portions of the patient's history were reviewed and updated as appropriate: allergies, current medications, past family history, past medical history, past social history, past surgical history and problem list.   Objective:   Vitals:   10/04/19 1338  BP: 138/78  Pulse: 89    Fetal Status:     Movement: Present     General:  Alert, oriented and cooperative.  Patient is in no acute distress.  Respiratory: Normal respiratory effort, no problems with respiration noted  Mental Status: Normal mood and affect. Normal behavior. Normal judgment and thought content.  Rest of physical exam deferred due to type of encounter  Imaging: [place] MFM OB FOLLOW UP  Result Date: 09/27/2019 ----------------------------------------------------------------------  OBSTETRICS REPORT                       (Signed Final 09/27/2019 02:20 pm) ---------------------------------------------------------------------- Patient Info  ID #:       [id number]                          D.O.B.:  1980-11-12 (38 yrs)  Name:       Sheila Davis                   Visit Date: 09/27/2019 01:12 pm ---------------------------------------------------------------------- Performed By  Performed By:     Ramond Craver Tester BS,       Ref. Address:     [address]                    RDMS, RVT                                                             Rd  Jacky Kindle  Attending:        Ma Rings MD         [facility]  Referred By:      Wilmer Floor LEFTWICH-                    Craige Cotta CNM ---------------------------------------------------------------------- Orders   #  Description                          Code         Ordered By   1  [place] MFM OB FOLLOW UP                  [phone]     YU FANG  ----------------------------------------------------------------------   #  Order #                    Accession #                 Episode #   1  [id number]                  [id number]                  [id number]  ---------------------------------------------------------------------- Indications   Obesity complicating pregnancy, third          O99.213   trimester BMI 43   Gestational diabetes in pregnancy, diet        O24.410   controlled   [redacted] weeks gestation of pregnancy                 Z3A.33   Encounter for other antenatal screening        Z36.2   follow-up  (Low Risk NIPS)   Advanced maternal age multigravida 80+,        O72.523   third trimester   Genetic carrier (Silent carrier for Alpha-     Z14.8   Thalassemia)   Genetic carrier (Increased carrier risk for    Z14.8   SMA)   Previous cesarean delivery, antepartum x 2     O34.219   Other mental disorder complicating             O99.340   pregnancy, third trimester  ---------------------------------------------------------------------- Vital Signs                                                 Height:        5'5" ---------------------------------------------------------------------- Fetal Evaluation  Num Of Fetuses:         1  Fetal Heart Rate(bpm):  149  Cardiac Activity:       Observed  Presentation:           Cephalic  Placenta:               Anterior  P. Cord Insertion:      Previously Visualized  Amniotic Fluid  AFI FV:      Within normal limits  AFI Sum(cm)     %Tile       Largest Pocket(cm)  13.22           42          4.3  RUQ(cm)       RLQ(cm)       LUQ(cm)        LLQ(cm)  4.3           3.64          2.15           3.13 ---------------------------------------------------------------------- Biometry  BPD:      86.2  mm     G. Age:  34w 5d         84  %    CI:        82.49   %    70 - 86                                                          FL/HC:      21.1   %    19.9 - 21.5  HC:      299.4  mm     G. Age:  33w 1d         13  %    HC/AC:      0.99        0.96 - 1.11  AC:      303.8  mm     G. Age:  34w 2d         80  %    FL/BPD:     73.4   %    71 - 87  FL:       63.3  mm     G. Age:  32w 5d         25  %    FL/AC:      20.8   %    20 - 24  LV:        5.5  mm  Est. FW:    2281  gm           5 lb     58  % ---------------------------------------------------------------------- OB History  Gravidity:    5         Term:   2         SAB:   2  Living:       2 ----------------------------------------------------------------------  Gestational Age  LMP:           33w 2d        Date:  02/06/19                 EDD:   11/13/19  U/S Today:     33w 5d                                        EDD:   11/10/19  Best:          33w 2d     Det. By:  LMP  (02/06/19)          EDD:  11/13/19 ---------------------------------------------------------------------- Anatomy  Cranium:               Previously seen        Aortic Arch:            Previously seen  Cavum:                 Previously seen        Ductal Arch:            Not well visualized  Ventricles:            Appears normal         Diaphragm:              Appears normal  Choroid Plexus:        Previously seen        Stomach:                Appears normal, left                                                                        sided  Cerebellum:            Previously seen        Abdomen:                Previously seen  Posterior Fossa:       Previously seen        Abdominal Wall:         Previously seen  Nuchal Fold:           Previously seen        Cord Vessels:           Previously seen  Face:                  Orbits and profile     Kidneys:                Appear normal                         previously seen  Lips:                  Previously seen        Bladder:                Appears normal  Thoracic:              Previously seen        Spine:                  Previously seen  Heart:                 Previously seen        Upper Extremities:      Previously seen  RVOT:                  Previously seen        Lower Extremities:      Previously seen  LVOT:                  Previously seen  Other:  Female gender  previously seen. Technically difficult due to maternal          habitus and fetal position. ---------------------------------------------------------------------- Cervix Uterus Adnexa  Cervix  Not visualized (advanced GA >24wks)  Uterus  No abnormality visualized.  Left Ovary  Not visualized. No adnexal mass visualized.  Right Ovary  Within normal limits. No adnexal mass visualized.  Cul De  Sac  No free fluid seen.  Adnexa  No abnormality visualized. ---------------------------------------------------------------------- Comments  This patient was seen for a follow up growth scan due to  maternal obesity and recently diagnosed A1 gestational  diabetes.  She was informed that the fetal growth and amniotic fluid  level appears appropriate for her gestational age.  A follow up exam was scheduled in 4 weeks. ----------------------------------------------------------------------                   Ma Rings, MD Electronically Signed Final Report   09/27/2019 02:20 pm ----------------------------------------------------------------------   Assessment and Plan:  Pregnancy: [id number] at [redacted]w[redacted]d 1. Supervision of other normal pregnancy, antepartum Good fetal movement  2. Obesity in pregnancy  3. COVID-19 Discussed partner screening, quaranteening  4. Gestational diabetes mellitus (GDM) in third trimester, gestational diabetes method of control unspecified Diet controlled. Fasting controlled. Reports no values greater than 120.  5. History of 2 cesarean sections Rpt c/s at 39 weeks  6. Elevated BP without diagnosis of hypertension Will watch closely. Normal today. Had elevated BP on 12/28 in MAU.  Preterm labor symptoms and general obstetric precautions including but not limited to vaginal bleeding, contractions, leaking of fluid and fetal movement were reviewed in detail with the patient. I discussed the assessment and treatment plan with the patient. The patient was provided an opportunity to ask questions and all were answered. The patient agreed with the plan and demonstrated an understanding of the instructions. The patient was advised to call back or seek an in-person office evaluation/go to MAU at [facility] for any urgent or concerning symptoms. Please refer to After Visit Summary for other counseling recommendations.   I provided 11 minutes of face-to-face time  during this encounter.  Return in about 1 week (around 10/11/2019) for HR OB f/u, OCVID + 10/03/19.  Future Appointments  Date Time Provider Department Center  10/05/2019  1:45 PM [facility] [place] WOC-WOCA WOC  10/25/2019  3:00 PM WH-MFC NURSE WH-MFC MFC-US  10/25/2019  3:00 PM WH-MFC [place] 3 WH-MFCUS MFC-US    Levie Heritage, DO Center for [employer], [facility]

## 2019-10-05 ENCOUNTER — Ambulatory Visit: Payer: Medicaid Other | Admitting: Clinical

## 2019-10-05 DIAGNOSIS — Z91199 Patient's noncompliance with other medical treatment and regimen due to unspecified reason: Secondary | ICD-10-CM

## 2019-10-05 DIAGNOSIS — Z5329 Procedure and treatment not carried out because of patient's decision for other reasons: Secondary | ICD-10-CM

## 2019-10-12 ENCOUNTER — Telehealth: Payer: Self-pay

## 2019-10-12 ENCOUNTER — Encounter (HOSPITAL_COMMUNITY): Payer: Self-pay | Admitting: Family Medicine

## 2019-10-12 ENCOUNTER — Inpatient Hospital Stay (HOSPITAL_COMMUNITY)
Admission: AD | Admit: 2019-10-12 | Discharge: 2019-10-13 | Disposition: A | Discharge status: Home / Self Care | Payer: Medicaid Other | Attending: Family Medicine | Admitting: Family Medicine

## 2019-10-12 DIAGNOSIS — Z3480 Encounter for supervision of other normal pregnancy, unspecified trimester: Secondary | ICD-10-CM

## 2019-10-12 DIAGNOSIS — O212 Late vomiting of pregnancy: Secondary | ICD-10-CM | POA: Diagnosis not present

## 2019-10-12 DIAGNOSIS — O163 Unspecified maternal hypertension, third trimester: Secondary | ICD-10-CM | POA: Insufficient documentation

## 2019-10-12 DIAGNOSIS — O98513 Other viral diseases complicating pregnancy, third trimester: Secondary | ICD-10-CM | POA: Diagnosis not present

## 2019-10-12 DIAGNOSIS — U071 COVID-19: Secondary | ICD-10-CM | POA: Diagnosis not present

## 2019-10-12 DIAGNOSIS — Z87891 Personal history of nicotine dependence: Secondary | ICD-10-CM | POA: Diagnosis not present

## 2019-10-12 DIAGNOSIS — Z3A35 35 weeks gestation of pregnancy: Secondary | ICD-10-CM | POA: Diagnosis not present

## 2019-10-12 DIAGNOSIS — O26893 Other specified pregnancy related conditions, third trimester: Secondary | ICD-10-CM | POA: Insufficient documentation

## 2019-10-12 DIAGNOSIS — O09523 Supervision of elderly multigravida, third trimester: Secondary | ICD-10-CM | POA: Diagnosis not present

## 2019-10-12 DIAGNOSIS — R109 Unspecified abdominal pain: Secondary | ICD-10-CM | POA: Diagnosis not present

## 2019-10-12 DIAGNOSIS — M549 Dorsalgia, unspecified: Secondary | ICD-10-CM | POA: Diagnosis not present

## 2019-10-12 DIAGNOSIS — O99210 Obesity complicating pregnancy, unspecified trimester: Secondary | ICD-10-CM

## 2019-10-12 DIAGNOSIS — Z79899 Other long term (current) drug therapy: Secondary | ICD-10-CM | POA: Diagnosis not present

## 2019-10-12 DIAGNOSIS — O133 Gestational [pregnancy-induced] hypertension without significant proteinuria, third trimester: Secondary | ICD-10-CM | POA: Diagnosis not present

## 2019-10-12 LAB — URINALYSIS, ROUTINE W REFLEX MICROSCOPIC
Bilirubin Urine: NEGATIVE
Glucose, UA: NEGATIVE mg/dL
Hgb urine dipstick: NEGATIVE
Ketones, ur: 80 mg/dL — AB
Leukocytes,Ua: NEGATIVE
Nitrite: NEGATIVE
Protein, ur: 30 mg/dL — AB
Specific Gravity, Urine: 1.012 (ref 1.005–1.030)
pH: 6 (ref 5.0–8.0)

## 2019-10-12 LAB — CBC WITH DIFFERENTIAL/PLATELET
Abs Immature Granulocytes: 0.05 10*3/uL (ref 0.00–0.07)
Basophils Absolute: 0 10*3/uL (ref 0.0–0.1)
Basophils Relative: 0 %
Eosinophils Absolute: 0 10*3/uL (ref 0.0–0.5)
Eosinophils Relative: 0 %
HCT: 39 % (ref 36.0–46.0)
Hemoglobin: 12 g/dL (ref 12.0–15.0)
Immature Granulocytes: 1 %
Lymphocytes Relative: 11 %
Lymphs Abs: 1.3 10*3/uL (ref 0.7–4.0)
MCH: 27 pg (ref 26.0–34.0)
MCHC: 30.8 g/dL (ref 30.0–36.0)
MCV: 87.8 fL (ref 80.0–100.0)
Monocytes Absolute: 0.6 10*3/uL (ref 0.1–1.0)
Monocytes Relative: 6 %
Neutro Abs: 9 10*3/uL — ABNORMAL HIGH (ref 1.7–7.7)
Neutrophils Relative %: 82 %
Platelets: 361 10*3/uL (ref 150–400)
RBC: 4.44 MIL/uL (ref 3.87–5.11)
RDW: 14.5 % (ref 11.5–15.5)
WBC: 11 10*3/uL — ABNORMAL HIGH (ref 4.0–10.5)
nRBC: 0 % (ref 0.0–0.2)

## 2019-10-12 LAB — COMPREHENSIVE METABOLIC PANEL
ALT: 15 U/L (ref 0–44)
AST: 25 U/L (ref 15–41)
Albumin: 2.6 g/dL — ABNORMAL LOW (ref 3.5–5.0)
Alkaline Phosphatase: 143 U/L — ABNORMAL HIGH (ref 38–126)
Anion gap: 11 (ref 5–15)
BUN: <5 mg/dL — ABNORMAL LOW (ref 6–20)
CO2: 19 mmol/L — ABNORMAL LOW (ref 22–32)
Calcium: 9.5 mg/dL (ref 8.9–10.3)
Chloride: 108 mmol/L (ref 98–111)
Creatinine, Ser: 0.69 mg/dL (ref 0.44–1.00)
GFR calc Af Amer: >60 mL/min (ref >60)
GFR calc non Af Amer: >60 mL/min (ref >60)
Glucose, Bld: 81 mg/dL (ref 70–99)
Potassium: 4 mmol/L (ref 3.5–5.1)
Sodium: 138 mmol/L (ref 135–145)
Total Bilirubin: 0.6 mg/dL (ref 0.3–1.2)
Total Protein: 6.7 g/dL (ref 6.5–8.1)

## 2019-10-12 LAB — PROTEIN / CREATININE RATIO, URINE
Creatinine, Urine: 113.04 mg/dL
Protein Creatinine Ratio: 0.28 mg/mg{Cre} — ABNORMAL HIGH (ref 0.00–0.15)
Total Protein, Urine: 32 mg/dL

## 2019-10-12 MED ORDER — DEXTROSE 5 % IN LACTATED RINGERS IV BOLUS
1000.0000 mL | Freq: Once | INTRAVENOUS | Status: AC
  Administered 2019-10-12: 1000 mL via INTRAVENOUS

## 2019-10-12 MED ORDER — LACTATED RINGERS IV BOLUS
1000.0000 mL | Freq: Once | INTRAVENOUS | Status: AC
  Administered 2019-10-12: 1000 mL via INTRAVENOUS

## 2019-10-12 MED ORDER — BUTALBITAL-APAP-CAFFEINE 50-325-40 MG PO TABS
1.0000 | ORAL_TABLET | Freq: Once | ORAL | Status: AC
  Administered 2019-10-12: 1 via ORAL
  Filled 2019-10-12: qty 1

## 2019-10-12 MED ORDER — ONDANSETRON HCL 4 MG/2ML IJ SOLN
4.0000 mg | Freq: Once | INTRAMUSCULAR | Status: AC
  Administered 2019-10-12: 4 mg via INTRAVENOUS
  Filled 2019-10-12: qty 2

## 2019-10-12 MED ORDER — LACTATED RINGERS IV BOLUS: 1000.0000 mL | Freq: Once | INTRAVENOUS | Status: DC

## 2019-10-12 MED ORDER — FAMOTIDINE IN NACL 20-0.9 MG/50ML-% IV SOLN
20.0000 mg | Freq: Once | INTRAVENOUS | Status: AC
  Administered 2019-10-12: 20 mg via INTRAVENOUS
  Filled 2019-10-12: qty 50

## 2019-10-12 MED ORDER — SODIUM CHLORIDE 0.9 % IV SOLN: 8.0000 mg | Freq: Once | INTRAVENOUS | Status: DC

## 2019-10-12 NOTE — Telephone Encounter (Signed)
Return call to pt regarding not feeling well. Pt C/O vomiting swelling in face hands and feet  Pt reported B/P to be 170/113 in rt arm 172/111 in left arm  Pt advised to report to MAU ASAP  Pt stated husband can drive her to hospital   Consulted w/ in office provider Dr. Clearance Coots agreed pt need MAU evaluation right now.

## 2019-10-12 NOTE — MAU Note (Addendum)
Pt sent for evaluation of Bps at home, pt is Covid + and has also been feeling abdominal and back pain, with increased nausea and projectile vomiting.   Pt also feels some chills,but is afebrile at 98.6.  Says she has some clear discharge, no VB and +FM.

## 2019-10-12 NOTE — MAU Provider Note (Addendum)
History     CSN: [id number]  Arrival date and time: 10/12/19 1738   First Provider Initiated Contact with Patient 10/12/19 1842      Chief Complaint  Patient presents with  . Hypertension  . Abdominal Pain  . Back Pain   HPI   Ms.Sheila Davis is a 39 y.o. female [phone] @ 43w3dhere with elevated BP readings at home. She is currenlty Covid +, she was diagnosed on Dec 28 and has been asymptomatic. States today she has not felt well. States she vomited several times and has not been eating much. States she feels pressure in her lower abdomen that comes and goes like contractions. She has not taking any main medication for the pain. She denies bleeding. + fetal movement. She denies shortness of breath or scotoma. No upper abdominal pain or HA.   OB History    Gravida  5   Para  2   Term  2   Preterm      AB  2   Living  2     SAB  2   TAB  0   Ectopic      Multiple      Live Births              Past Medical History:  Diagnosis Date  . Anxiety   . Depression    pcp  . History of stomach ulcers   . Migraines   . Seasonal allergies     Past Surgical History:  Procedure Laterality Date  . CESAREAN SECTION      2002 & 2006    Family History  Problem Relation Age of Onset  . Cancer - Ovarian Mother 649 . Heart disease Mother   . Cancer Mother   . Hypertension Father   . Diabetes Paternal Grandmother     Social History   Tobacco Use  . Smoking status: Former Smoker    Packs/day: 0.20    Years: 0.50    Pack years: 0.10    Types: Cigarettes    Quit date: 09/02/2015    Years since quitting: 4.1  . Smokeless tobacco: Never Used  Substance Use Topics  . Alcohol use: Not Currently    Alcohol/week: 0.0 standard drinks    Comment: occasionally  . Drug use: No    Allergies:  Allergies  Allergen Reactions  . Penicillins Rash    Has patient had a PCN reaction causing immediate rash, facial/tongue/throat swelling, SOB or lightheadedness with  hypotension: no Has patient had a PCN reaction causing severe rash involving mucus membranes or skin necrosis: white puss Has patient had a PCN reaction that required hospitalization no Has patient had a PCN reaction occurring within the last 10 years: yes If all of the above answers are "NO", then may proceed with Cephalosporin use.     Medications Prior to Admission  Medication Sig Dispense Refill Last Dose  . Prenat-FeCbn-FeAsp-Meth-FA-DHA (PRENATE MINI) 18-0.6-0.4-350 MG CAPS Take 1 capsule by mouth daily. 30 capsule 11 10/11/2019 at Unknown time  . zolpidem (AMBIEN) 10 MG tablet Take 5 mg to help with sleep prn 30 tablet 0 10/11/2019 at Unknown time  . Accu-Chek FastClix Lancets MISC 1 each by Percutaneous route 4 (four) times daily. 100 each 6   . Blood Pressure Monitoring (BLOOD PRESSURE CUFF) MISC 1 Device by Does not apply route once a week. 1 each 0   . Blood Pressure Monitoring (BLOOD PRESSURE KIT) DEVI 1 kit by Does not  apply route as needed. 1 kit 0   . butalbital-acetaminophen-caffeine (FIORICET) 50-325-40 MG tablet Take 1-2 tablets by mouth every 6 (six) hours as needed for headache. 20 tablet 0 Unknown at Unknown time  . diphenhydramine-acetaminophen (TYLENOL PM) 25-500 MG TABS tablet Take 1 tablet by mouth at bedtime as needed.     Marland Kitchen glucose blood (ACCU-CHEK GUIDE) test strip Use 1 test strip to check blood glucose 4 times daily 100 each 12   . metoCLOPramide (REGLAN) 10 MG tablet Take 10 mg by mouth 4 (four) times daily.   Unknown at Unknown time  . omeprazole (PRILOSEC) 10 MG capsule Take 10 mg by mouth daily.   Unknown at Unknown time  . ondansetron (ZOFRAN) 4 MG tablet Take 1 tablet (4 mg total) by mouth every 8 (eight) hours as needed for nausea or vomiting. 20 tablet 0 Unknown at Unknown time  . promethazine (PHENERGAN) 25 MG tablet Take 0.5-1 tablets (12.5-25 mg total) by mouth every 6 (six) hours as needed for nausea. 30 tablet 3 Unknown at Unknown time   Results for  orders placed or performed during the hospital encounter of 10/12/19 (from the past 48 hour(s))  Urinalysis, Routine w reflex microscopic     Status: Abnormal   Collection Time: 10/12/19  6:56 PM  Result Value Ref Range   Color, Urine YELLOW YELLOW   APPearance CLEAR CLEAR   Specific Gravity, Urine 1.012 1.005 - 1.030   pH 6.0 5.0 - 8.0   Glucose, UA NEGATIVE NEGATIVE mg/dL   Hgb urine dipstick NEGATIVE NEGATIVE   Bilirubin Urine NEGATIVE NEGATIVE   Ketones, ur 80 (A) NEGATIVE mg/dL   Protein, ur 30 (A) NEGATIVE mg/dL   Nitrite NEGATIVE NEGATIVE   Leukocytes,Ua NEGATIVE NEGATIVE   WBC, UA 0-5 0 - 5 WBC/hpf   Bacteria, UA RARE (A) NONE SEEN   Squamous Epithelial / LPF 0-5 0 - 5   Mucus PRESENT     Comment: Performed at [facility], [place] [address] [place], [place] [address]  Protein / creatinine ratio, urine     Status: Abnormal   Collection Time: 10/12/19  7:00 PM  Result Value Ref Range   Creatinine, Urine 113.04 mg/dL   Total Protein, Urine 32 mg/dL    Comment: NO NORMAL RANGE ESTABLISHED FOR THIS TEST   Protein Creatinine Ratio 0.28 (H) 0.00 - 0.15 mg/mg[Cre]    Comment: Performed at [facility] [address], [place],  [address]  Comprehensive metabolic panel     Status: Abnormal   Collection Time: 10/12/19  7:31 PM  Result Value Ref Range   Sodium 138 135 - 145 mmol/L   Potassium 4.0 3.5 - 5.1 mmol/L   Chloride 108 98 - 111 mmol/L   CO2 19 (L) 22 - 32 mmol/L   Glucose, Bld 81 70 - 99 mg/dL   BUN <5 (L) 6 - 20 mg/dL   Creatinine, Ser 0.69 0.44 - 1.00 mg/dL   Calcium 9.5 8.9 - 10.3 mg/dL   Total Protein 6.7 6.5 - 8.1 g/dL   Albumin 2.6 (L) 3.5 - 5.0 g/dL   AST 25 15 - 41 U/L   ALT 15 0 - 44 U/L   Alkaline Phosphatase 143 (H) 38 - 126 U/L   Total Bilirubin 0.6 0.3 - 1.2 mg/dL   GFR calc non Af Amer >60 >60 mL/min   GFR calc Af Amer >60 >60 mL/min   Anion gap 11 5 - 15  Comment: Performed at [facility], [place] [address],  [place], [place] [address]  CBC with Differential/Platelet     Status: Abnormal   Collection Time: 10/12/19  7:31 PM  Result Value Ref Range   WBC 11.0 (H) 4.0 - 10.5 K/uL   RBC 4.44 3.87 - 5.11 MIL/uL   Hemoglobin 12.0 12.0 - 15.0 g/dL   HCT 39.0 36.0 - 46.0 %   MCV 87.8 80.0 - 100.0 fL   MCH 27.0 26.0 - 34.0 pg   MCHC 30.8 30.0 - 36.0 g/dL   RDW 14.5 11.5 - 15.5 %   Platelets 361 150 - 400 K/uL   nRBC 0.0 0.0 - 0.2 %   Neutrophils Relative % 82 %   Neutro Abs 9.0 (H) 1.7 - 7.7 K/uL   Lymphocytes Relative 11 %   Lymphs Abs 1.3 0.7 - 4.0 K/uL   Monocytes Relative 6 %   Monocytes Absolute 0.6 0.1 - 1.0 K/uL   Eosinophils Relative 0 %   Eosinophils Absolute 0.0 0.0 - 0.5 K/uL   Basophils Relative 0 %   Basophils Absolute 0.0 0.0 - 0.1 K/uL   Immature Granulocytes 1 %   Abs Immature Granulocytes 0.05 0.00 - 0.07 K/uL    Comment: Performed at [facility] [address] [place], [place] [address]   Review of Systems  Respiratory: Negative for shortness of breath.   Gastrointestinal: Positive for nausea and vomiting. Negative for diarrhea.   Physical Exam   Blood pressure 122/68, pulse 88, temperature 98.6 F (37 C), temperature source Oral, resp. rate 18, last menstrual period 02/06/2019, SpO2 100 %.   Patient Vitals for the past 24 hrs:  BP Temp Temp src Pulse Resp SpO2  10/12/19 2031 119/72 -- -- 95 -- --  10/12/19 2030 -- -- -- -- -- 99 %  10/12/19 2025 -- -- -- -- -- 100 %  10/12/19 2020 -- -- -- -- -- 100 %  10/12/19 2016 122/68 -- -- 88 -- --  10/12/19 2015 -- -- -- -- -- 100 %  10/12/19 2010 -- -- -- -- -- 100 %  10/12/19 2005 -- -- -- -- -- 100 %  10/12/19 2003 121/70 -- -- 89 -- --  10/12/19 2000 -- -- -- -- -- 100 %  10/12/19 1946 134/90 -- -- 92 -- --  10/12/19 1945 -- -- -- -- -- 100 %  10/12/19 1940 -- -- -- -- -- 99 %  10/12/19 1935 -- -- -- -- -- 100 %  10/12/19 1931 120/87 -- -- (!) 110 -- --  10/12/19 1922 135/86 -- -- (!) 122 -- --  10/12/19 1837  -- -- -- -- -- 99 %  10/12/19 1831 127/79 -- -- (!) 101 -- --  10/12/19 1830 -- -- -- -- -- 99 %  10/12/19 1825 -- -- -- -- -- 99 %  10/12/19 1824 118/78 98.6 F (37 C) Oral (!) 105 18 99 %  10/12/19 1821 118/78 -- -- (!) 110 -- --    Physical Exam  Constitutional: She is oriented to person, place, and time. She appears well-developed and well-nourished.  Non-toxic appearance. She has a sickly appearance. No distress.  Eyes: Pupils are equal, round, and reactive to light.  Respiratory: Effort normal.  GI: Soft. She exhibits no distension. There is no abdominal tenderness. There is no rebound.  Genitourinary:    Genitourinary Comments: Dilation: Closed Effacement (%): Thick Exam by:: Altamease Oiler NP   Musculoskeletal:  General: Normal range of motion.     Cervical back: Normal range of motion.  Neurological: She is alert and oriented to person, place, and time.  Skin: Skin is warm. She is not diaphoretic.  Psychiatric: Her behavior is normal.   Fetal Tracing: Baseline: 145 bpm Variability: Moderate  Accelerations: 15x15 Decelerations: None Toco: Occasional   MAU Course  Procedures  None  MDM  PIH labs ordered  Patient brought BP cuff with her today to MAU. Cuff is to small for patient. Patient is 116 kg and using a Med BP cuff. Large cuff in MAU shows normal BP and is more fitting for patient's arm. Her BP cuff read 158/109 in the right lower arm. Hospital BP read WNL LR bolus X 2 D5LR Bolus X 1 Pepcid & Zofran IV given Cervix is closed Discussed patient with Dr. Hulan Fray. Report given to M.Jimmye Norman CNM who resumes care of the patient.  Lezlie Lye, NP 10/12/2019 9:04 PM  Care assumed from Johny Shears, CNM at 12:00 AM  -Reviewed BPs and lab results with Dr. Hulan Fray. Questionable pre-E by symptoms of visual changes, protein/creatinine normal, blood work WNL. Recommendation for admission to [facility] for BMZx2 followed by IOL. -Reassessment of patient: HA and visual  changes gone after Fioricet. Patient and husband adamantly refuse admission at this time -No severe range Blood Pressures. BP cuff is too small, patient and husband would prefer to purchase a larger BP cuff tomorrow morning prior to her virtual appointment with the office. -Patient and husband amenable to starting Blue series in case symptoms return and induction of labor is indicated. -Case re-discussed with Dr. Hulan Fray who agrees with the plan below. Assessment and Plan  G5P2002 at 35.4 EGA who present to MAU with elevated BP readings at home. Also complained of headache with vision changes (seeing "sparkles"). -Patient and husband adamantly refused admission for BMZ series with subsequent IOL. -HA and vision changes gone after administration of Fioricet. -P:Cr 0.28, was 0.27 on 10/03/2019 -No severe range BPs in MAU, patient's BP cuff is too small for her arm. Correct measurements were made by the nursing staff, and patient will obtain a proper size cuff from her pharmacy tomorrow morning. Patient meets criteria for gestational hypertension -BMZ x1 today, patient will return for 2nd dose tomorrow in case IOL indicated -Strict return precautions discussed with the patient including signs/symptoms of pre-eclamspia v gHTN. She will return to MAU if any occur. -Follow up with virtual office appointment tomorrow  Merilyn Baba, DO OB Fellow, Faculty Practice 10/13/2019 12:35 AM

## 2019-10-13 ENCOUNTER — Telehealth (INDEPENDENT_AMBULATORY_CARE_PROVIDER_SITE_OTHER): Payer: Medicaid Other | Admitting: Obstetrics & Gynecology

## 2019-10-13 ENCOUNTER — Other Ambulatory Visit: Payer: Self-pay

## 2019-10-13 ENCOUNTER — Telehealth: Payer: Self-pay | Admitting: Advanced Practice Midwife

## 2019-10-13 DIAGNOSIS — O99213 Obesity complicating pregnancy, third trimester: Secondary | ICD-10-CM | POA: Diagnosis not present

## 2019-10-13 DIAGNOSIS — Z3A35 35 weeks gestation of pregnancy: Secondary | ICD-10-CM

## 2019-10-13 DIAGNOSIS — Z3480 Encounter for supervision of other normal pregnancy, unspecified trimester: Secondary | ICD-10-CM

## 2019-10-13 DIAGNOSIS — O133 Gestational [pregnancy-induced] hypertension without significant proteinuria, third trimester: Secondary | ICD-10-CM

## 2019-10-13 DIAGNOSIS — O99210 Obesity complicating pregnancy, unspecified trimester: Secondary | ICD-10-CM

## 2019-10-13 MED ORDER — BETAMETHASONE SOD PHOS & ACET 6 (3-3) MG/ML IJ SUSP
12.0000 mg | Freq: Once | INTRAMUSCULAR | Status: AC
  Administered 2019-10-13: 12 mg via INTRAMUSCULAR
  Filled 2019-10-13: qty 5

## 2019-10-13 NOTE — Progress Notes (Signed)
Pt is on the phone preparing for virtual visit with provider. [redacted]w[redacted]d. Pt reports she was at MAU yesterday due to her home BP cuff reading high. Pt reports home cuff reading high BP today, but pt denies any symptoms of blurry vision or HA. Pt tested positive for COVID-19 last week. Pt reports decreased fetal movement today.

## 2019-10-13 NOTE — Patient Instructions (Signed)
Preeclampsia and Eclampsia Preeclampsia is a serious condition that may develop during pregnancy. This condition causes high blood pressure and increased protein in your urine along with other symptoms, such as headaches and vision changes. These symptoms may develop as the condition gets worse. Preeclampsia may occur at 20 weeks of pregnancy or later. Diagnosing and treating preeclampsia early is very important. If not treated early, it can cause serious problems for you and your baby. One problem it can lead to is eclampsia. Eclampsia is a condition that causes muscle jerking or shaking (convulsions or seizures) and other serious problems for the mother. During pregnancy, delivering your baby may be the best treatment for preeclampsia or eclampsia. For most women, preeclampsia and eclampsia symptoms go away after giving birth. In rare cases, a woman may develop preeclampsia after giving birth (postpartum preeclampsia). This usually occurs within 48 hours after childbirth but may occur up to 6 weeks after giving birth. What are the causes? The cause of preeclampsia is not known. What increases the risk? The following risk factors make you more likely to develop preeclampsia:  Being pregnant for the first time.  Having had preeclampsia during a past pregnancy.  Having a family history of preeclampsia.  Having high blood pressure.  Being pregnant with more than one baby.  Being 35 or older.  Being African-American.  Having kidney disease or diabetes.  Having medical conditions such as lupus or blood diseases.  Being very overweight (obese). What are the signs or symptoms? The most common symptoms are:  Severe headaches.  Vision problems, such as blurred or double vision.  Abdominal pain, especially upper abdominal pain. Other symptoms that may develop as the condition gets worse include:  Sudden weight gain.  Sudden swelling of the hands, face, legs, and feet.  Severe nausea  and vomiting.  Numbness in the face, arms, legs, and feet.  Dizziness.  Urinating less than usual.  Slurred speech.  Convulsions or seizures. How is this diagnosed? There are no screening tests for preeclampsia. Your health care provider will ask you about symptoms and check for signs of preeclampsia during your prenatal visits. You may also have tests that include:  Checking your blood pressure.  Urine tests to check for protein. Your health care provider will check for this at every prenatal visit.  Blood tests.  Monitoring your baby's heart rate.  Ultrasound. How is this treated? You and your health care provider will determine the treatment approach that is best for you. Treatment may include:  Having more frequent prenatal exams to check for signs of preeclampsia, if you have an increased risk for preeclampsia.  Medicine to lower your blood pressure.  Staying in the hospital, if your condition is severe. There, treatment will focus on controlling your blood pressure and the amount of fluids in your body (fluid retention).  Taking medicine (magnesium sulfate) to prevent seizures. This may be given as an injection or through an IV.  Taking a low-dose aspirin during your pregnancy.  Delivering your baby early. You may have your labor started with medicine (induced), or you may have a cesarean delivery. Follow these instructions at home: Eating and drinking   Drink enough fluid to keep your urine pale yellow.  Avoid caffeine. Lifestyle  Do not use any products that contain nicotine or tobacco, such as cigarettes and e-cigarettes. If you need help quitting, ask your health care provider.  Do not use alcohol or drugs.  Avoid stress as much as possible. Rest and get   plenty of sleep. General instructions  Take over-the-counter and prescription medicines only as told by your health care provider.  When lying down, lie on your left side. This keeps pressure off your  major blood vessels.  When sitting or lying down, raise (elevate) your feet. Try putting some pillows underneath your lower legs.  Exercise regularly. Ask your health care provider what kinds of exercise are best for you.  Keep all follow-up and prenatal visits as told by your health care provider. This is important. How is this prevented? There is no known way of preventing preeclampsia or eclampsia from developing. However, to lower your risk of complications and detect problems early:  Get regular prenatal care. Your health care provider may be able to diagnose and treat the condition early.  Maintain a healthy weight. Ask your health care provider for help managing weight gain during pregnancy.  Work with your health care provider to manage any long-term (chronic) health conditions you have, such as diabetes or kidney problems.  You may have tests of your blood pressure and kidney function after giving birth.  Your health care provider may have you take low-dose aspirin during your next pregnancy. Contact a health care provider if:  You have symptoms that your health care provider told you may require more treatment or monitoring, such as: ? Headaches. ? Nausea or vomiting. ? Abdominal pain. ? Dizziness. ? Light-headedness. Get help right away if:  You have severe: ? Abdominal pain. ? Headaches that do not get better. ? Dizziness. ? Vision problems. ? Confusion. ? Nausea or vomiting.  You have any of the following: ? A seizure. ? Sudden, rapid weight gain. ? Sudden swelling in your hands, ankles, or face. ? Trouble moving any part of your body. ? Numbness in any part of your body. ? Trouble speaking. ? Abnormal bleeding.  You faint. Summary  Preeclampsia is a serious condition that may develop during pregnancy.  This condition causes high blood pressure and increased protein in your urine along with other symptoms, such as headaches and vision  changes.  Diagnosing and treating preeclampsia early is very important. If not treated early, it can cause serious problems for you and your baby.  Get help right away if you have symptoms that your health care provider told you to watch for. This information is not intended to replace advice given to you by your health care provider. Make sure you discuss any questions you have with your health care provider. Document Revised: 05/25/2018 Document Reviewed: 04/28/2016 Elsevier Patient Education  2020 [employer] Inc[employer]  

## 2019-10-13 NOTE — Discharge Instructions (Signed)
Hypertension During Pregnancy Hypertension is also called high blood pressure. High blood pressure means that the force of your blood moving in your body is too strong. It can cause problems for you and your baby. Different types of high blood pressure can happen during pregnancy. The types are:  High blood pressure before you got pregnant. This is called chronic hypertension.  This can continue during your pregnancy. Your doctor will want to keep checking your blood pressure. You may need medicine to keep your blood pressure under control while you are pregnant. You will need follow-up visits after you have your baby.  High blood pressure that goes up during pregnancy when it was normal before. This is called gestational hypertension. It will usually get better after you have your baby, but your doctor will need to watch your blood pressure to make sure that it is getting better.  Very high blood pressure during pregnancy. This is called preeclampsia. Very high blood pressure is an emergency that needs to be checked and treated right away.  You may develop very high blood pressure after giving birth. This is called postpartum preeclampsia. This usually occurs within 48 hours after childbirth but may occur up to 6 weeks after giving birth. This is rare. How does this affect me? If you have high blood pressure during pregnancy, you have a higher chance of developing high blood pressure:  As you get older.  If you get pregnant again. In some cases, high blood pressure during pregnancy can cause:  Stroke.  Heart attack.  Damage to the kidneys, lungs, or liver.  Preeclampsia.  Jerky movements you cannot control (convulsions or seizures).  Problems with the placenta. How does this affect my baby? Your baby may:  Be born early.  Not weigh as much as he or she should.  Not handle labor well, leading to a c-section birth. What are the risks?  Having high blood pressure during a past  pregnancy.  Being overweight.  Being 35 years old or older.  Being pregnant for the first time.  Being pregnant with more than one baby.  Becoming pregnant using fertility methods, such as IVF.  Having other problems, such as diabetes, or kidney disease.  Having family members who have high blood pressure. What can I do to lower my risk?   Keep a healthy weight.  Eat a healthy diet.  Follow what your doctor tells you about treating any medical problems that you had before becoming pregnant. It is very important to go to all of your doctor visits. Your doctor will check your blood pressure and make sure that your pregnancy is progressing as it should. Treatment should start early if a problem is found. How is this treated? Treatment for high blood pressure during pregnancy can differ depending on the type of high blood pressure you have and how serious it is.  You may need to take blood pressure medicine.  If you have been taking medicine for your blood pressure, you may need to change the medicine during pregnancy if it is not safe for your baby.  If your doctor thinks that you could get very high blood pressure, he or she may tell you to take a low-dose aspirin during your pregnancy.  If you have very high blood pressure, you may need to stay in the hospital so you and your baby can be watched closely. You may also need to take medicine to lower your blood pressure. This medicine may be given by mouth   or through an IV tube.  In some cases, if your condition gets worse, you may need to have your baby early. Follow these instructions at home: Eating and drinking   Drink enough fluid to keep your pee (urine) pale yellow.  Avoid caffeine. Lifestyle  Do not use any products that contain nicotine or tobacco, such as cigarettes, e-cigarettes, and chewing tobacco. If you need help quitting, ask your doctor.  Do not use alcohol or drugs.  Avoid stress.  Rest and get plenty  of sleep.  Regular exercise can help. Ask your doctor what kinds of exercise are best for you. General instructions  Take over-the-counter and prescription medicines only as told by your doctor.  Keep all prenatal and follow-up visits as told by your doctor. This is important. Contact a doctor if:  You have symptoms that your doctor told you to watch for, such as: ? Headaches. ? Nausea. ? Vomiting. ? Belly (abdominal) pain. ? Dizziness. ? Light-headedness. Get help right away if:  You have: ? Very bad belly pain that does not get better with treatment. ? A very bad headache that does not get better. ? Vomiting that does not get better. ? Sudden, fast weight gain. ? Sudden swelling in your hands, ankles, or face. ? Bleeding from your vagina. ? Blood in your pee. ? Blurry vision. ? Double vision. ? Shortness of breath. ? Chest pain. ? Weakness on one side of your body. ? Trouble talking.  Your baby is not moving as much as usual. Summary  High blood pressure is also called hypertension.  High blood pressure means that the force of your blood moving in your body is too strong.  High blood pressure can cause problems for you and your baby.  Keep all follow-up visits as told by your doctor. This is important. This information is not intended to replace advice given to you by your health care provider. Make sure you discuss any questions you have with your health care provider. Document Revised: 01/13/2019 Document Reviewed: 10/19/2018 Elsevier Patient Education  [place].   Preeclampsia and Eclampsia Preeclampsia is a serious condition that may develop during pregnancy. This condition causes high blood pressure and increased protein in your urine along with other symptoms, such as headaches and vision changes. These symptoms may develop as the condition gets worse. Preeclampsia may occur at 20 weeks of pregnancy or later. Diagnosing and treating preeclampsia  early is very important. If not treated early, it can cause serious problems for you and your baby. One problem it can lead to is eclampsia. Eclampsia is a condition that causes muscle jerking or shaking (convulsions or seizures) and other serious problems for the mother. During pregnancy, delivering your baby may be the best treatment for preeclampsia or eclampsia. For most women, preeclampsia and eclampsia symptoms go away after giving birth. In rare cases, a woman may develop preeclampsia after giving birth (postpartum preeclampsia). This usually occurs within 48 hours after childbirth but may occur up to 6 weeks after giving birth. What are the causes? The cause of preeclampsia is not known. What increases the risk? The following risk factors make you more likely to develop preeclampsia:  Being pregnant for the first time.  Having had preeclampsia during a past pregnancy.  Having a family history of preeclampsia.  Having high blood pressure.  Being pregnant with more than one baby.  Being 63 or older.  Being African-American.  Having kidney disease or diabetes.  Having medical conditions such  as lupus or blood diseases.  Being very overweight (obese). What are the signs or symptoms? The most common symptoms are:  Severe headaches.  Vision problems, such as blurred or double vision.  Abdominal pain, especially upper abdominal pain. Other symptoms that may develop as the condition gets worse include:  Sudden weight gain.  Sudden swelling of the hands, face, legs, and feet.  Severe nausea and vomiting.  Numbness in the face, arms, legs, and feet.  Dizziness.  Urinating less than usual.  Slurred speech.  Convulsions or seizures. How is this diagnosed? There are no screening tests for preeclampsia. Your health care provider will ask you about symptoms and check for signs of preeclampsia during your prenatal visits. You may also have tests that include:  Checking  your blood pressure.  Urine tests to check for protein. Your health care provider will check for this at every prenatal visit.  Blood tests.  Monitoring your baby's heart rate.  Ultrasound. How is this treated? You and your health care provider will determine the treatment approach that is best for you. Treatment may include:  Having more frequent prenatal exams to check for signs of preeclampsia, if you have an increased risk for preeclampsia.  Medicine to lower your blood pressure.  Staying in the hospital, if your condition is severe. There, treatment will focus on controlling your blood pressure and the amount of fluids in your body (fluid retention).  Taking medicine (magnesium sulfate) to prevent seizures. This may be given as an injection or through an IV.  Taking a low-dose aspirin during your pregnancy.  Delivering your baby early. You may have your labor started with medicine (induced), or you may have a cesarean delivery. Follow these instructions at home: Eating and drinking   Drink enough fluid to keep your urine pale yellow.  Avoid caffeine. Lifestyle  Do not use any products that contain nicotine or tobacco, such as cigarettes and e-cigarettes. If you need help quitting, ask your health care provider.  Do not use alcohol or drugs.  Avoid stress as much as possible. Rest and get plenty of sleep. General instructions  Take over-the-counter and prescription medicines only as told by your health care provider.  When lying down, lie on your left side. This keeps pressure off your major blood vessels.  When sitting or lying down, raise (elevate) your feet. Try putting some pillows underneath your lower legs.  Exercise regularly. Ask your health care provider what kinds of exercise are best for you.  Keep all follow-up and prenatal visits as told by your health care provider. This is important. How is this prevented? There is no known way of preventing  preeclampsia or eclampsia from developing. However, to lower your risk of complications and detect problems early:  Get regular prenatal care. Your health care provider may be able to diagnose and treat the condition early.  Maintain a healthy weight. Ask your health care provider for help managing weight gain during pregnancy.  Work with your health care provider to manage any long-term (chronic) health conditions you have, such as diabetes or kidney problems.  You may have tests of your blood pressure and kidney function after giving birth.  Your health care provider may have you take low-dose aspirin during your next pregnancy. Contact a health care provider if:  You have symptoms that your health care provider told you may require more treatment or monitoring, such as: ? Headaches. ? Nausea or vomiting. ? Abdominal pain. ? Dizziness. ? Light-headedness.  Get help right away if:  You have severe: ? Abdominal pain. ? Headaches that do not get better. ? Dizziness. ? Vision problems. ? Confusion. ? Nausea or vomiting.  You have any of the following: ? A seizure. ? Sudden, rapid weight gain. ? Sudden swelling in your hands, ankles, or face. ? Trouble moving any part of your body. ? Numbness in any part of your body. ? Trouble speaking. ? Abnormal bleeding.  You faint. Summary  Preeclampsia is a serious condition that may develop during pregnancy.  This condition causes high blood pressure and increased protein in your urine along with other symptoms, such as headaches and vision changes.  Diagnosing and treating preeclampsia early is very important. If not treated early, it can cause serious problems for you and your baby.  Get help right away if you have symptoms that your health care provider told you to watch for. This information is not intended to replace advice given to you by your health care provider. Make sure you discuss any questions you have with your health  care provider. Document Revised: 05/25/2018 Document Reviewed: 04/28/2016 Elsevier Patient Education  2020 [employer].

## 2019-10-13 NOTE — Progress Notes (Signed)
   TELEHEALTH VIRTUAL OBSTETRICS VISIT ENCOUNTER NOTE  I connected with Sheila Davis on 10/13/19 at  1:45 PM EST by telephone at home and verified that I am speaking with the correct person using two identifiers.   I discussed the limitations, risks, security and privacy concerns of performing an evaluation and management service by telephone and the availability of in person appointments. I also discussed with the patient that there may be a patient responsible charge related to this service. The patient expressed understanding and agreed to proceed.  Subjective:  Sheila Davis is a 39 y.o. [id number] at [redacted]w[redacted]d being followed for ongoing prenatal care.  She is currently monitored for the following issues for this high-risk pregnancy and has Prediabetes; Supervision of other normal pregnancy, antepartum; History of 2 cesarean sections; Multigravida of advanced maternal age in second trimester; Abdominal hernia without obstruction and without gangrene; Headache in pregnancy, antepartum, second trimester; Nausea and vomiting during pregnancy prior to [redacted] weeks gestation; Alpha thalassemia silent carrier; Abnormal genetic test during pregnancy; Obesity in pregnancy; Gestational diabetes mellitus; and COVID-19 on their problem list.  Patient reports elevated BP at home today, was seen in [facility] yesterday. Reports fetal movement. Denies any contractions, bleeding or leaking of fluid.   The following portions of the patient's history were reviewed and updated as appropriate: allergies, current medications, past family history, past medical history, past social history, past surgical history and problem list.   Objective:   General:  Alert, oriented and cooperative.   Mental Status: Normal mood and affect perceived. Normal judgment and thought content.  Rest of physical exam deferred due to type of encounter  Assessment and Plan:  Pregnancy: [id number] at [redacted]w[redacted]d 1. Obesity in pregnancy   2. Supervision of  other normal pregnancy, antepartum Severe range BP needs evaluation ASAP in [facility]U, instructed to not wait  Preterm labor symptoms and general obstetric precautions including but not limited to vaginal bleeding, contractions, leaking of fluid and fetal movement were reviewed in detail with the patient.  I discussed the assessment and treatment plan with the patient. The patient was provided an opportunity to ask questions and all were answered. The patient agreed with the plan and demonstrated an understanding of the instructions. The patient was advised to call back or seek an in-person office evaluation/go to [facility]U at [facility] for any urgent or concerning symptoms. Please refer to After Visit Summary for other counseling recommendations.   I provided 10 minutes of non-face-to-face time during this encounter.  Return in about 1 week (around 10/20/2019).  Future Appointments  Date Time Provider Department Center  10/25/2019  3:00 PM [facility] NURSE [facility]  10/25/2019  3:00 PM [facility] [place] 3 [facility]    Scheryl Darter, MD [facility], [facility]

## 2019-10-13 NOTE — Telephone Encounter (Signed)
Received message from Dr Debroah Loop that pt had severe range BP at home today.  Per chart review, pt was seen in [facility]U yesterday, 10/12/19, with elevated BP at home and was normotensive in MAU.  BP cuff from home was evaluated and compared to MAU cuff. Pt was encouraged to pick up a larger cuff at her pharmacy for more accurate BP.    Called pt to clarify whether these BPs today are on the new cuff.  Pt returning to [facility]U today for BMZ injection but if concerns about home BP, may need to come sooner for BP work up.  Left message for pt to return call.

## 2019-10-20 ENCOUNTER — Ambulatory Visit (INDEPENDENT_AMBULATORY_CARE_PROVIDER_SITE_OTHER): Payer: Medicaid Other | Admitting: Obstetrics and Gynecology

## 2019-10-20 ENCOUNTER — Other Ambulatory Visit (HOSPITAL_COMMUNITY)
Admission: RE | Admit: 2019-10-20 | Discharge: 2019-10-20 | Disposition: A | Discharge status: Home / Self Care | Payer: Medicaid Other | Source: Ambulatory Visit | Attending: Obstetrics and Gynecology | Admitting: Obstetrics and Gynecology

## 2019-10-20 ENCOUNTER — Other Ambulatory Visit: Payer: Self-pay

## 2019-10-20 VITALS — BP 143/93 | HR 106 | Wt 255.0 lb

## 2019-10-20 DIAGNOSIS — Z98891 History of uterine scar from previous surgery: Secondary | ICD-10-CM

## 2019-10-20 DIAGNOSIS — O99210 Obesity complicating pregnancy, unspecified trimester: Secondary | ICD-10-CM

## 2019-10-20 DIAGNOSIS — Z3A36 36 weeks gestation of pregnancy: Secondary | ICD-10-CM

## 2019-10-20 DIAGNOSIS — O24419 Gestational diabetes mellitus in pregnancy, unspecified control: Secondary | ICD-10-CM

## 2019-10-20 DIAGNOSIS — Z3009 Encounter for other general counseling and advice on contraception: Secondary | ICD-10-CM

## 2019-10-20 DIAGNOSIS — Z3480 Encounter for supervision of other normal pregnancy, unspecified trimester: Secondary | ICD-10-CM | POA: Diagnosis not present

## 2019-10-20 DIAGNOSIS — O139 Gestational [pregnancy-induced] hypertension without significant proteinuria, unspecified trimester: Secondary | ICD-10-CM | POA: Insufficient documentation

## 2019-10-20 DIAGNOSIS — O09523 Supervision of elderly multigravida, third trimester: Secondary | ICD-10-CM

## 2019-10-20 DIAGNOSIS — O99213 Obesity complicating pregnancy, third trimester: Secondary | ICD-10-CM

## 2019-10-20 DIAGNOSIS — O133 Gestational [pregnancy-induced] hypertension without significant proteinuria, third trimester: Secondary | ICD-10-CM

## 2019-10-20 DIAGNOSIS — U071 COVID-19: Secondary | ICD-10-CM

## 2019-10-20 DIAGNOSIS — O09522 Supervision of elderly multigravida, second trimester: Secondary | ICD-10-CM

## 2019-10-20 NOTE — Progress Notes (Signed)
   PRENATAL VISIT NOTE  Subjective:  Sheila Davis is a 39 y.o. [id number] at [redacted]w[redacted]d being seen today for ongoing prenatal care.  She is currently monitored for the following issues for this high-risk pregnancy and has Prediabetes; Supervision of other normal pregnancy, antepartum; History of 2 cesarean sections; Multigravida of advanced maternal age in second trimester; Abdominal hernia without obstruction and without gangrene; Headache in pregnancy, antepartum, second trimester; Nausea and vomiting during pregnancy prior to [redacted] weeks gestation; Alpha thalassemia silent carrier; Abnormal genetic test during pregnancy; Obesity in pregnancy; Gestational diabetes mellitus; COVID-19; Unwanted fertility; and Gestational hypertension on their problem list.  Patient reports no complaints.  Contractions: Not present. Vag. Bleeding: None.  Movement: Present. Denies leaking of fluid.   The following portions of the patient's history were reviewed and updated as appropriate: allergies, current medications, past family history, past medical history, past social history, past surgical history and problem list.   Objective:   Vitals:   10/20/19 1322  BP: (!) 143/93  Pulse: (!) 106  Weight: 255 lb (115.7 kg)    Fetal Status: Fetal Heart Rate (bpm): 155   Movement: Present     General:  Alert, oriented and cooperative. Patient is in no acute distress.  Skin: Skin is warm and dry. No rash noted.   Cardiovascular: Normal heart rate noted  Respiratory: Normal respiratory effort, no problems with respiration noted  Abdomen: Soft, gravid, appropriate for gestational age.  Pain/Pressure: Present     Pelvic: Cervical exam deferred        Extremities: Normal range of motion.     Mental Status: Normal mood and affect. Normal behavior. Normal judgment and thought content.   Assessment and Plan:  Pregnancy: [id number] at [redacted]w[redacted]d  1. Supervision of other normal pregnancy, antepartum  2. History of 2 cesarean  sections RCS scheduled   3. Multigravida of advanced maternal age in second trimester  4. COVID-19 Positive 10/03/19 Had congestion but feeling much better now  5. Obesity in pregnancy  6. Gestational diabetes mellitus (GDM) in third trimester, gestational diabetes method of control unspecified Diet control FG: 70-80s PP: 110s Last growth 58th%tile  7. Unwanted fertility For BTL  8. Gestational hypertension, third trimester Severe range BP last week, did not go to [facility]U Dx gHTN today, will move delivery up to 37 weeks RCS+BTL moved to 37 weeks CBC, CMP, UPCr today  Preterm labor symptoms and general obstetric precautions including but not limited to vaginal bleeding, contractions, leaking of fluid and fetal movement were reviewed in detail with the patient. Please refer to After Visit Summary for other counseling recommendations.   No follow-ups on file.  Future Appointments  Date Time Provider Department Center  10/25/2019  3:00 PM [facility] NURSE [facility]  10/25/2019  3:00 PM [facility] [place] 3 [facility]    Conan Bowens, MD

## 2019-10-21 ENCOUNTER — Inpatient Hospital Stay (HOSPITAL_COMMUNITY)
Admission: AD | Admit: 2019-10-21 | Discharge: 2019-10-21 | Disposition: A | Discharge status: Home / Self Care | Payer: Medicaid Other | Attending: Obstetrics and Gynecology | Admitting: Obstetrics and Gynecology

## 2019-10-21 ENCOUNTER — Other Ambulatory Visit: Payer: Self-pay

## 2019-10-21 DIAGNOSIS — Z01812 Encounter for preprocedural laboratory examination: Secondary | ICD-10-CM | POA: Diagnosis not present

## 2019-10-21 DIAGNOSIS — O24419 Gestational diabetes mellitus in pregnancy, unspecified control: Secondary | ICD-10-CM | POA: Diagnosis not present

## 2019-10-21 LAB — COMPREHENSIVE METABOLIC PANEL
ALT: 15 IU/L (ref 0–32)
ALT: 21 U/L (ref 0–44)
AST: 17 IU/L (ref 0–40)
AST: 21 U/L (ref 15–41)
Albumin/Globulin Ratio: 1.2 (ref 1.2–2.2)
Albumin: 3.2 g/dL — ABNORMAL LOW (ref 3.8–4.8)
Albumin: 2.5 g/dL — ABNORMAL LOW (ref 3.5–5.0)
Alkaline Phosphatase: 161 IU/L — ABNORMAL HIGH (ref 39–117)
Alkaline Phosphatase: 151 U/L — ABNORMAL HIGH (ref 38–126)
Anion gap: 9 (ref 5–15)
BUN/Creatinine Ratio: 8 — ABNORMAL LOW (ref 9–23)
BUN: 5 mg/dL — ABNORMAL LOW (ref 6–20)
BUN: <5 mg/dL — ABNORMAL LOW (ref 6–20)
Bilirubin Total: 0.3 mg/dL (ref 0.0–1.2)
CO2: 17 mmol/L — ABNORMAL LOW (ref 20–29)
CO2: 20 mmol/L — ABNORMAL LOW (ref 22–32)
Calcium: 9.3 mg/dL (ref 8.7–10.2)
Calcium: 8.8 mg/dL — ABNORMAL LOW (ref 8.9–10.3)
Chloride: 104 mmol/L (ref 96–106)
Chloride: 108 mmol/L (ref 98–111)
Creatinine, Ser: 0.61 mg/dL (ref 0.57–1.00)
Creatinine, Ser: 0.7 mg/dL (ref 0.44–1.00)
GFR calc Af Amer: 133 mL/min/{1.73_m2} (ref >59)
GFR calc Af Amer: >60 mL/min (ref >60)
GFR calc non Af Amer: 115 mL/min/{1.73_m2} (ref >59)
GFR calc non Af Amer: >60 mL/min (ref >60)
Globulin, Total: 2.7 g/dL (ref 1.5–4.5)
Glucose, Bld: 87 mg/dL (ref 70–99)
Glucose: 161 mg/dL — ABNORMAL HIGH (ref 65–99)
Potassium: 4 mmol/L (ref 3.5–5.2)
Potassium: 3.7 mmol/L (ref 3.5–5.1)
Sodium: 137 mmol/L (ref 134–144)
Sodium: 137 mmol/L (ref 135–145)
Total Bilirubin: 0.8 mg/dL (ref 0.3–1.2)
Total Protein: 5.9 g/dL — ABNORMAL LOW (ref 6.0–8.5)
Total Protein: 6.3 g/dL — ABNORMAL LOW (ref 6.5–8.1)

## 2019-10-21 LAB — TYPE AND SCREEN
ABO/RH(D): O POS
Antibody Screen: NEGATIVE

## 2019-10-21 LAB — CERVICOVAGINAL ANCILLARY ONLY
Chlamydia: NEGATIVE
Comment: NEGATIVE
Comment: NORMAL
Neisseria Gonorrhea: NEGATIVE

## 2019-10-21 LAB — CBC
HCT: 36 % (ref 36.0–46.0)
Hematocrit: 33.7 % — ABNORMAL LOW (ref 34.0–46.6)
Hemoglobin: 11 g/dL — ABNORMAL LOW (ref 11.1–15.9)
Hemoglobin: 11.4 g/dL — ABNORMAL LOW (ref 12.0–15.0)
MCH: 26.8 pg (ref 26.6–33.0)
MCH: 26.9 pg (ref 26.0–34.0)
MCHC: 32.6 g/dL (ref 31.5–35.7)
MCHC: 31.7 g/dL (ref 30.0–36.0)
MCV: 82 fL (ref 79–97)
MCV: 84.9 fL (ref 80.0–100.0)
Platelets: 310 10*3/uL (ref 150–450)
Platelets: 316 10*3/uL (ref 150–400)
RBC: 4.1 x10E6/uL (ref 3.77–5.28)
RBC: 4.24 MIL/uL (ref 3.87–5.11)
RDW: 14 % (ref 11.7–15.4)
RDW: 14.7 % (ref 11.5–15.5)
WBC: 9.7 10*3/uL (ref 3.4–10.8)
WBC: 9.5 10*3/uL (ref 4.0–10.5)
nRBC: 0 % (ref 0.0–0.2)

## 2019-10-21 LAB — PROTEIN / CREATININE RATIO, URINE
Creatinine, Urine: 163.7 mg/dL
Protein, Ur: 32.7 mg/dL
Protein/Creat Ratio: 200 mg/g creat (ref 0–200)

## 2019-10-21 LAB — ABO/RH: ABO/RH(D): O POS

## 2019-10-21 NOTE — MAU Note (Addendum)
Pt here for pre- procedural blood work.  Phleb called and notified.  Pt does not need to be swabbed. Unable to be registered with usual "appt" pts.  No charge

## 2019-10-22 LAB — RPR: RPR Ser Ql: NONREACTIVE

## 2019-10-23 ENCOUNTER — Encounter (HOSPITAL_COMMUNITY): Payer: Self-pay | Admitting: Obstetrics & Gynecology

## 2019-10-23 ENCOUNTER — Other Ambulatory Visit: Payer: Self-pay

## 2019-10-23 ENCOUNTER — Encounter (HOSPITAL_COMMUNITY)
Admission: RE | Disposition: A | Discharge status: Home / Self Care | Payer: Self-pay | Source: Home / Self Care | Attending: Obstetrics & Gynecology

## 2019-10-23 ENCOUNTER — Inpatient Hospital Stay (HOSPITAL_COMMUNITY): Payer: Medicaid Other | Admitting: Anesthesiology

## 2019-10-23 ENCOUNTER — Inpatient Hospital Stay (HOSPITAL_COMMUNITY)
Admission: RE | Admit: 2019-10-23 | Discharge: 2019-10-25 | DRG: 785 | Disposition: A | Discharge status: Home / Self Care | Payer: Medicaid Other | Attending: Obstetrics & Gynecology | Admitting: Obstetrics & Gynecology

## 2019-10-23 DIAGNOSIS — O99214 Obesity complicating childbirth: Secondary | ICD-10-CM | POA: Diagnosis present

## 2019-10-23 DIAGNOSIS — Z3A37 37 weeks gestation of pregnancy: Secondary | ICD-10-CM | POA: Diagnosis not present

## 2019-10-23 DIAGNOSIS — Z302 Encounter for sterilization: Secondary | ICD-10-CM | POA: Diagnosis not present

## 2019-10-23 DIAGNOSIS — Z8616 Personal history of COVID-19: Secondary | ICD-10-CM | POA: Diagnosis present

## 2019-10-23 DIAGNOSIS — F329 Major depressive disorder, single episode, unspecified: Secondary | ICD-10-CM | POA: Diagnosis present

## 2019-10-23 DIAGNOSIS — U071 COVID-19: Secondary | ICD-10-CM | POA: Diagnosis present

## 2019-10-23 DIAGNOSIS — D563 Thalassemia minor: Secondary | ICD-10-CM | POA: Diagnosis present

## 2019-10-23 DIAGNOSIS — F419 Anxiety disorder, unspecified: Secondary | ICD-10-CM | POA: Diagnosis present

## 2019-10-23 DIAGNOSIS — Z87891 Personal history of nicotine dependence: Secondary | ICD-10-CM

## 2019-10-23 DIAGNOSIS — O24419 Gestational diabetes mellitus in pregnancy, unspecified control: Secondary | ICD-10-CM | POA: Diagnosis present

## 2019-10-23 DIAGNOSIS — O134 Gestational [pregnancy-induced] hypertension without significant proteinuria, complicating childbirth: Secondary | ICD-10-CM | POA: Diagnosis present

## 2019-10-23 DIAGNOSIS — O24420 Gestational diabetes mellitus in childbirth, diet controlled: Secondary | ICD-10-CM | POA: Diagnosis present

## 2019-10-23 DIAGNOSIS — O285 Abnormal chromosomal and genetic finding on antenatal screening of mother: Secondary | ICD-10-CM | POA: Diagnosis present

## 2019-10-23 DIAGNOSIS — Z98891 History of uterine scar from previous surgery: Secondary | ICD-10-CM

## 2019-10-23 DIAGNOSIS — O99344 Other mental disorders complicating childbirth: Secondary | ICD-10-CM | POA: Diagnosis present

## 2019-10-23 DIAGNOSIS — E669 Obesity, unspecified: Secondary | ICD-10-CM | POA: Diagnosis present

## 2019-10-23 DIAGNOSIS — Z3480 Encounter for supervision of other normal pregnancy, unspecified trimester: Secondary | ICD-10-CM

## 2019-10-23 DIAGNOSIS — O34211 Maternal care for low transverse scar from previous cesarean delivery: Principal | ICD-10-CM | POA: Diagnosis present

## 2019-10-23 DIAGNOSIS — Z880 Allergy status to penicillin: Secondary | ICD-10-CM

## 2019-10-23 DIAGNOSIS — O139 Gestational [pregnancy-induced] hypertension without significant proteinuria, unspecified trimester: Secondary | ICD-10-CM | POA: Diagnosis present

## 2019-10-23 DIAGNOSIS — O09522 Supervision of elderly multigravida, second trimester: Secondary | ICD-10-CM | POA: Diagnosis present

## 2019-10-23 DIAGNOSIS — O99210 Obesity complicating pregnancy, unspecified trimester: Secondary | ICD-10-CM

## 2019-10-23 DIAGNOSIS — O9852 Other viral diseases complicating childbirth: Secondary | ICD-10-CM | POA: Diagnosis not present

## 2019-10-23 DIAGNOSIS — Z3009 Encounter for other general counseling and advice on contraception: Secondary | ICD-10-CM | POA: Diagnosis present

## 2019-10-23 LAB — COMPREHENSIVE METABOLIC PANEL
ALT: 17 U/L (ref 0–44)
AST: 18 U/L (ref 15–41)
Albumin: 2.3 g/dL — ABNORMAL LOW (ref 3.5–5.0)
Alkaline Phosphatase: 128 U/L — ABNORMAL HIGH (ref 38–126)
Anion gap: 8 (ref 5–15)
BUN: 5 mg/dL — ABNORMAL LOW (ref 6–20)
CO2: 22 mmol/L (ref 22–32)
Calcium: 9.1 mg/dL (ref 8.9–10.3)
Chloride: 109 mmol/L (ref 98–111)
Creatinine, Ser: 0.74 mg/dL (ref 0.44–1.00)
GFR calc Af Amer: >60 mL/min (ref >60)
GFR calc non Af Amer: >60 mL/min (ref >60)
Glucose, Bld: 105 mg/dL — ABNORMAL HIGH (ref 70–99)
Potassium: 4.2 mmol/L (ref 3.5–5.1)
Sodium: 139 mmol/L (ref 135–145)
Total Bilirubin: 0.5 mg/dL (ref 0.3–1.2)
Total Protein: 5.5 g/dL — ABNORMAL LOW (ref 6.5–8.1)

## 2019-10-23 LAB — CBC
HCT: 35.2 % — ABNORMAL LOW (ref 36.0–46.0)
Hemoglobin: 11 g/dL — ABNORMAL LOW (ref 12.0–15.0)
MCH: 26.8 pg (ref 26.0–34.0)
MCHC: 31.3 g/dL (ref 30.0–36.0)
MCV: 85.9 fL (ref 80.0–100.0)
Platelets: 263 10*3/uL (ref 150–400)
RBC: 4.1 MIL/uL (ref 3.87–5.11)
RDW: 14.6 % (ref 11.5–15.5)
WBC: 13.2 10*3/uL — ABNORMAL HIGH (ref 4.0–10.5)
nRBC: 0 % (ref 0.0–0.2)

## 2019-10-23 LAB — GLUCOSE, CAPILLARY: Glucose-Capillary: 96 mg/dL (ref 70–99)

## 2019-10-23 SURGERY — Surgical Case
Anesthesia: Spinal | Laterality: Bilateral | Wound class: Clean Contaminated

## 2019-10-23 MED ORDER — DIBUCAINE (PERIANAL) 1 % EX OINT: 1.0000 "application " | TOPICAL_OINTMENT | CUTANEOUS | Status: DC | PRN

## 2019-10-23 MED ORDER — IBUPROFEN 800 MG PO TABS: 800.0000 mg | ORAL_TABLET | Freq: Three times a day (TID) | ORAL | Status: DC

## 2019-10-23 MED ORDER — DIPHENHYDRAMINE HCL 50 MG/ML IJ SOLN: 12.5000 mg | INTRAMUSCULAR | Status: DC | PRN

## 2019-10-23 MED ORDER — DEXAMETHASONE SODIUM PHOSPHATE 4 MG/ML IJ SOLN
INTRAMUSCULAR | Status: DC | PRN
  Administered 2019-10-23: 6 mg via INTRAVENOUS

## 2019-10-23 MED ORDER — SIMETHICONE 80 MG PO CHEW
80.0000 mg | CHEWABLE_TABLET | Freq: Three times a day (TID) | ORAL | Status: DC
  Administered 2019-10-24 – 2019-10-25 (×3): 80 mg via ORAL
  Filled 2019-10-23 (×4): qty 1

## 2019-10-23 MED ORDER — HYDROXYZINE HCL 25 MG PO TABS
25.0000 mg | ORAL_TABLET | Freq: Once | ORAL | Status: AC
  Administered 2019-10-23: 25 mg via ORAL
  Filled 2019-10-23: qty 1

## 2019-10-23 MED ORDER — PRENATAL MULTIVITAMIN CH
1.0000 | ORAL_TABLET | Freq: Every day | ORAL | Status: DC
  Administered 2019-10-24: 1 via ORAL
  Filled 2019-10-23: qty 1

## 2019-10-23 MED ORDER — ONDANSETRON HCL 4 MG/2ML IJ SOLN
INTRAMUSCULAR | Status: DC | PRN
  Administered 2019-10-23: 4 mg via INTRAVENOUS

## 2019-10-23 MED ORDER — COCONUT OIL OIL: 1.0000 "application " | TOPICAL_OIL | Status: DC | PRN

## 2019-10-23 MED ORDER — MORPHINE SULFATE (PF) 0.5 MG/ML IJ SOLN
INTRAMUSCULAR | Status: AC
  Filled 2019-10-23: qty 10

## 2019-10-23 MED ORDER — MENTHOL 3 MG MT LOZG: 1.0000 | LOZENGE | OROMUCOSAL | Status: DC | PRN

## 2019-10-23 MED ORDER — ACETAMINOPHEN 500 MG PO TABS
ORAL_TABLET | ORAL | Status: AC
  Filled 2019-10-23: qty 2

## 2019-10-23 MED ORDER — NALBUPHINE HCL 10 MG/ML IJ SOLN: 5.0000 mg | Freq: Once | INTRAMUSCULAR | Status: DC | PRN

## 2019-10-23 MED ORDER — HYDROCODONE-ACETAMINOPHEN 5-325 MG PO TABS
1.0000 | ORAL_TABLET | ORAL | Status: DC | PRN
  Administered 2019-10-24 (×3): 2 via ORAL
  Administered 2019-10-24: 1 via ORAL
  Administered 2019-10-24 – 2019-10-25 (×3): 2 via ORAL
  Filled 2019-10-23 (×6): qty 2
  Filled 2019-10-23: qty 1
  Filled 2019-10-23: qty 2

## 2019-10-23 MED ORDER — GENTAMICIN SULFATE 40 MG/ML IJ SOLN
5.0000 mg/kg | INTRAVENOUS | Status: AC
  Administered 2019-10-23: 578.5 mg via INTRAVENOUS
  Filled 2019-10-23: qty 14.5

## 2019-10-23 MED ORDER — SIMETHICONE 80 MG PO CHEW: 80.0000 mg | CHEWABLE_TABLET | ORAL | Status: DC | PRN

## 2019-10-23 MED ORDER — PHENYLEPHRINE HCL-NACL 20-0.9 MG/250ML-% IV SOLN
INTRAVENOUS | Status: DC | PRN
  Administered 2019-10-23: 60 ug/min via INTRAVENOUS

## 2019-10-23 MED ORDER — OXYTOCIN 40 UNITS IN NORMAL SALINE INFUSION - SIMPLE MED
INTRAVENOUS | Status: DC | PRN
  Administered 2019-10-23: 40 mL via INTRAVENOUS

## 2019-10-23 MED ORDER — ONDANSETRON HCL 4 MG/2ML IJ SOLN: 4.0000 mg | Freq: Three times a day (TID) | INTRAMUSCULAR | Status: DC | PRN

## 2019-10-23 MED ORDER — NALBUPHINE HCL 10 MG/ML IJ SOLN: 5.0000 mg | INTRAMUSCULAR | Status: DC | PRN

## 2019-10-23 MED ORDER — DEXAMETHASONE SODIUM PHOSPHATE 10 MG/ML IJ SOLN
INTRAMUSCULAR | Status: AC
  Filled 2019-10-23: qty 1

## 2019-10-23 MED ORDER — OXYTOCIN 40 UNITS IN NORMAL SALINE INFUSION - SIMPLE MED: 2.5000 [IU]/h | INTRAVENOUS | Status: AC

## 2019-10-23 MED ORDER — PHENYLEPHRINE HCL-NACL 20-0.9 MG/250ML-% IV SOLN
INTRAVENOUS | Status: AC
  Filled 2019-10-23: qty 250

## 2019-10-23 MED ORDER — LACTATED RINGERS IV SOLN: INTRAVENOUS | Status: DC

## 2019-10-23 MED ORDER — KETOROLAC TROMETHAMINE 30 MG/ML IJ SOLN: 30.0000 mg | Freq: Once | INTRAMUSCULAR | Status: DC

## 2019-10-23 MED ORDER — CLINDAMYCIN PHOSPHATE 900 MG/50ML IV SOLN
INTRAVENOUS | Status: AC
  Filled 2019-10-23: qty 50

## 2019-10-23 MED ORDER — SODIUM CHLORIDE 0.9 % IV SOLN: INTRAVENOUS | Status: DC | PRN

## 2019-10-23 MED ORDER — ACETAMINOPHEN 500 MG PO TABS
1000.0000 mg | ORAL_TABLET | Freq: Once | ORAL | Status: AC
  Administered 2019-10-23: 1000 mg via ORAL

## 2019-10-23 MED ORDER — NALOXONE HCL 4 MG/10ML IJ SOLN
1.0000 ug/kg/h | INTRAVENOUS | Status: DC | PRN
  Filled 2019-10-23: qty 5

## 2019-10-23 MED ORDER — LABETALOL HCL 5 MG/ML IV SOLN
20.0000 mg | Freq: Once | INTRAVENOUS | Status: AC
  Administered 2019-10-23: 20 mg via INTRAVENOUS

## 2019-10-23 MED ORDER — FENTANYL CITRATE (PF) 100 MCG/2ML IJ SOLN
INTRAMUSCULAR | Status: DC | PRN
  Administered 2019-10-23: 15 ug via INTRATHECAL

## 2019-10-23 MED ORDER — ACETAMINOPHEN 500 MG PO TABS
1000.0000 mg | ORAL_TABLET | Freq: Four times a day (QID) | ORAL | Status: AC
  Administered 2019-10-23 (×2): 1000 mg via ORAL
  Filled 2019-10-23 (×2): qty 2

## 2019-10-23 MED ORDER — IBUPROFEN 600 MG PO TABS
600.0000 mg | ORAL_TABLET | Freq: Four times a day (QID) | ORAL | Status: DC | PRN
  Administered 2019-10-23 – 2019-10-25 (×7): 600 mg via ORAL
  Filled 2019-10-23 (×7): qty 1

## 2019-10-23 MED ORDER — BUPIVACAINE IN DEXTROSE 0.75-8.25 % IT SOLN
INTRATHECAL | Status: DC | PRN
  Administered 2019-10-23: 1.7 mg via INTRATHECAL

## 2019-10-23 MED ORDER — OXYTOCIN 40 UNITS IN NORMAL SALINE INFUSION - SIMPLE MED
INTRAVENOUS | Status: AC
  Filled 2019-10-23: qty 1000

## 2019-10-23 MED ORDER — PROMETHAZINE HCL 25 MG/ML IJ SOLN: 6.2500 mg | INTRAMUSCULAR | Status: DC | PRN

## 2019-10-23 MED ORDER — KETOROLAC TROMETHAMINE 30 MG/ML IJ SOLN
INTRAMUSCULAR | Status: AC
  Filled 2019-10-23: qty 1

## 2019-10-23 MED ORDER — MORPHINE SULFATE (PF) 0.5 MG/ML IJ SOLN
INTRAMUSCULAR | Status: DC | PRN
  Administered 2019-10-23: .15 mg via INTRATHECAL

## 2019-10-23 MED ORDER — LABETALOL HCL 5 MG/ML IV SOLN
INTRAVENOUS | Status: AC
  Filled 2019-10-23: qty 4

## 2019-10-23 MED ORDER — SENNOSIDES-DOCUSATE SODIUM 8.6-50 MG PO TABS
2.0000 | ORAL_TABLET | ORAL | Status: DC
  Administered 2019-10-24: 2 via ORAL
  Filled 2019-10-23 (×2): qty 2

## 2019-10-23 MED ORDER — KETOROLAC TROMETHAMINE 30 MG/ML IJ SOLN
30.0000 mg | Freq: Four times a day (QID) | INTRAMUSCULAR | Status: DC
  Administered 2019-10-23: 30 mg via INTRAVENOUS
  Filled 2019-10-23: qty 1

## 2019-10-23 MED ORDER — ENOXAPARIN SODIUM 60 MG/0.6ML ~~LOC~~ SOLN
60.0000 mg | SUBCUTANEOUS | Status: DC
  Administered 2019-10-24 – 2019-10-25 (×2): 60 mg via SUBCUTANEOUS
  Filled 2019-10-23 (×2): qty 0.6

## 2019-10-23 MED ORDER — KETOROLAC TROMETHAMINE 30 MG/ML IJ SOLN: 30.0000 mg | Freq: Four times a day (QID) | INTRAMUSCULAR | Status: DC | PRN

## 2019-10-23 MED ORDER — DEXTROSE 5 % IV SOLN: 3.0000 g | INTRAVENOUS | Status: DC

## 2019-10-23 MED ORDER — WITCH HAZEL-GLYCERIN EX PADS: 1.0000 "application " | MEDICATED_PAD | CUTANEOUS | Status: DC | PRN

## 2019-10-23 MED ORDER — FENTANYL CITRATE (PF) 100 MCG/2ML IJ SOLN
INTRAMUSCULAR | Status: AC
  Filled 2019-10-23: qty 2

## 2019-10-23 MED ORDER — SOD CITRATE-CITRIC ACID 500-334 MG/5ML PO SOLN: 30.0000 mL | ORAL | Status: DC

## 2019-10-23 MED ORDER — NALOXONE HCL 0.4 MG/ML IJ SOLN: 0.4000 mg | INTRAMUSCULAR | Status: DC | PRN

## 2019-10-23 MED ORDER — DIPHENHYDRAMINE HCL 25 MG PO CAPS
25.0000 mg | ORAL_CAPSULE | Freq: Four times a day (QID) | ORAL | Status: DC | PRN
  Filled 2019-10-23: qty 1

## 2019-10-23 MED ORDER — ACETAMINOPHEN 160 MG/5ML PO SOLN: 1000.0000 mg | Freq: Once | ORAL | Status: AC

## 2019-10-23 MED ORDER — SIMETHICONE 80 MG PO CHEW
80.0000 mg | CHEWABLE_TABLET | ORAL | Status: DC
  Administered 2019-10-23 – 2019-10-24 (×2): 80 mg via ORAL
  Filled 2019-10-23 (×3): qty 1

## 2019-10-23 MED ORDER — SODIUM CHLORIDE 0.9% FLUSH: 3.0000 mL | INTRAVENOUS | Status: DC | PRN

## 2019-10-23 MED ORDER — CLINDAMYCIN PHOSPHATE 900 MG/50ML IV SOLN
900.0000 mg | Freq: Once | INTRAVENOUS | Status: AC
  Administered 2019-10-23: 900 mg via INTRAVENOUS

## 2019-10-23 MED ORDER — DOXEPIN HCL 10 MG PO CAPS
10.0000 mg | ORAL_CAPSULE | Freq: Every evening | ORAL | Status: DC | PRN
  Administered 2019-10-24 (×2): 10 mg via ORAL
  Filled 2019-10-23 (×5): qty 1

## 2019-10-23 MED ORDER — FENTANYL CITRATE (PF) 100 MCG/2ML IJ SOLN: 25.0000 ug | INTRAMUSCULAR | Status: DC | PRN

## 2019-10-23 MED ORDER — ONDANSETRON HCL 4 MG/2ML IJ SOLN
INTRAMUSCULAR | Status: AC
  Filled 2019-10-23: qty 2

## 2019-10-23 MED ORDER — KETOROLAC TROMETHAMINE 30 MG/ML IJ SOLN
30.0000 mg | Freq: Four times a day (QID) | INTRAMUSCULAR | Status: DC | PRN
  Administered 2019-10-23: 30 mg via INTRAMUSCULAR

## 2019-10-23 MED ORDER — DIPHENHYDRAMINE HCL 25 MG PO CAPS
25.0000 mg | ORAL_CAPSULE | ORAL | Status: DC | PRN
  Administered 2019-10-24 (×2): 25 mg via ORAL
  Filled 2019-10-23: qty 1

## 2019-10-23 SURGICAL SUPPLY — 34 items
BARRIER ADHS 3X4 INTERCEED (GAUZE/BANDAGES/DRESSINGS) IMPLANT
CLAMP CORD UMBIL (MISCELLANEOUS) IMPLANT
CLOTH BEACON ORANGE TIMEOUT ST (SAFETY) ×2 IMPLANT
DRSG OPSITE POSTOP 4X10 (GAUZE/BANDAGES/DRESSINGS) ×2 IMPLANT
ELECT REM PT RETURN 9FT ADLT (ELECTROSURGICAL) ×2
ELECTRODE REM PT RTRN 9FT ADLT (ELECTROSURGICAL) ×1 IMPLANT
EXTRACTOR VACUUM KIWI (MISCELLANEOUS) ×2 IMPLANT
GAUZE SPONGE 4X4 3PLY NS LF (GAUZE/BANDAGES/DRESSINGS) ×4 IMPLANT
GLOVE BIO SURGEON STRL SZ 6.5 (GLOVE) ×2 IMPLANT
GLOVE BIOGEL PI IND STRL 7.0 (GLOVE) ×2 IMPLANT
GLOVE BIOGEL PI INDICATOR 7.0 (GLOVE) ×2
GOWN STRL REUS W/TWL LRG LVL3 (GOWN DISPOSABLE) ×4 IMPLANT
HOVERMATT SINGLE USE (MISCELLANEOUS) ×2 IMPLANT
KIT ABG SYR 3ML LUER SLIP (SYRINGE) IMPLANT
NEEDLE HYPO 22GX1.5 SAFETY (NEEDLE) IMPLANT
NEEDLE HYPO 25X5/8 SAFETYGLIDE (NEEDLE) IMPLANT
NS IRRIG 1000ML POUR BTL (IV SOLUTION) ×2 IMPLANT
PACK C SECTION WH (CUSTOM PROCEDURE TRAY) ×2 IMPLANT
PAD ABD 7.5X8 STRL (GAUZE/BANDAGES/DRESSINGS) ×2 IMPLANT
PAD OB MATERNITY 4.3X12.25 (PERSONAL CARE ITEMS) ×2 IMPLANT
PENCIL SMOKE EVAC W/HOLSTER (ELECTROSURGICAL) ×2 IMPLANT
RETRACTOR TRAXI PANNICULUS (MISCELLANEOUS) ×1 IMPLANT
RTRCTR C-SECT PINK 25CM LRG (MISCELLANEOUS) IMPLANT
SUT PLAIN 2 0 XLH (SUTURE) ×2 IMPLANT
SUT VIC AB 0 CT1 36 (SUTURE) ×16 IMPLANT
SUT VIC AB 2-0 CT1 27 (SUTURE) ×1
SUT VIC AB 2-0 CT1 TAPERPNT 27 (SUTURE) ×1 IMPLANT
SUT VIC AB 4-0 KS 27 (SUTURE) ×2 IMPLANT
SUT VIC AB 4-0 PS2 27 (SUTURE) ×2 IMPLANT
SYR CONTROL 10ML LL (SYRINGE) IMPLANT
TOWEL OR 17X24 6PK STRL BLUE (TOWEL DISPOSABLE) ×2 IMPLANT
TRAXI PANNICULUS RETRACTOR (MISCELLANEOUS) ×1
TRAY FOLEY W/BAG SLVR 14FR LF (SET/KITS/TRAYS/PACK) IMPLANT
WATER STERILE IRR 1000ML POUR (IV SOLUTION) ×2 IMPLANT

## 2019-10-23 NOTE — Anesthesia Procedure Notes (Signed)
Spinal  Patient location during procedure: OR Start time: 10/23/2019 10:01 AM End time: 10/23/2019 10:04 AM Staffing Performed: anesthesiologist  Anesthesiologist: Kaylyn Layer, MD Preanesthetic Checklist Completed: patient identified, IV checked, risks and benefits discussed, monitors and equipment checked, pre-op evaluation and timeout performed Spinal Block Patient position: sitting Prep: DuraPrep and site prepped and draped Patient monitoring: heart rate, continuous pulse ox and blood pressure Approach: midline Location: L3-4 Injection technique: single-shot Needle Needle type: Pencan  Needle gauge: 24 G Needle length: 10 cm Assessment Sensory level: T4 Additional Notes Risks, benefits, and alternative discussed. Patient gave consent to procedure. Prepped and draped in sitting position. Clear CSF obtained after one needle pass. Positive terminal aspiration. No pain or paraesthesias with injection. Patient tolerated procedure well. Vital signs stable. Sheila Greenhouse, MD

## 2019-10-23 NOTE — Progress Notes (Signed)
Attempted to call patient to see if she was coming today for her cesarean section. No answer, voicemail left. Sheila Davis [place], [place] 10/23/2019 8:31 AM

## 2019-10-23 NOTE — Anesthesia Postprocedure Evaluation (Signed)
Anesthesia Post Note  Patient: Sheila Davis  Procedure(s) Performed: CESAREAN SECTION WITH BILATERAL TUBAL LIGATION (Bilateral )     Patient location during evaluation: PACU Anesthesia Type: Spinal Level of consciousness: awake and alert and oriented Pain management: pain level controlled Vital Signs Assessment: post-procedure vital signs reviewed and stable Respiratory status: spontaneous breathing, nonlabored ventilation and respiratory function stable Cardiovascular status: blood pressure returned to baseline Postop Assessment: no apparent nausea or vomiting, spinal receding, no headache and no backache Anesthetic complications: no    Last Vitals:  Vitals:   10/23/19 1145 10/23/19 1200  BP: 119/75 116/71  Pulse: 83 86  Resp: 15 15  Temp:    SpO2: 98% 99%    Last Pain:  Vitals:   10/23/19 1200  TempSrc:   PainSc: 0-No pain    Kaylyn Layer

## 2019-10-23 NOTE — Op Note (Signed)
Sheila Davis PROCEDURE DATE: 10/23/2019  PREOPERATIVE DIAGNOSES: Intrauterine pregnancy at [redacted]w[redacted]d weeks gestation; history of two prior cesareans, gestational hypertension, GDMA1; undesired fertility  POSTOPERATIVE DIAGNOSES: The same  PROCEDURE: Repeat Low Transverse Cesarean Section, Bilateral Tubal Sterilization using Pomeroy method  SURGEON:  Dr. Emeterio Reeve  ASSISTANT:  Dr. Clayton Lefort  ANESTHESIOLOGIST: Dr. Daiva Huge  INDICATIONS: Sheila Davis is a 39 y.o. [id number] at [redacted]w[redacted]d here for cesarean section and bilateral tubal sterilization secondary to the indications listed under preoperative diagnoses; please see preoperative note for further details.  The risks of surgery were discussed with the patient including but were not limited to: bleeding which may require transfusion or reoperation; infection which may require antibiotics; injury to bowel, bladder, ureters or other surrounding organs; injury to the fetus; need for additional procedures including hysterectomy in the event of a life-threatening hemorrhage; placental abnormalities wth subsequent pregnancies, incisional problems, thromboembolic phenomenon and other postoperative/anesthesia complications.  Patient also desires permanent sterilization.  Other reversible forms of contraception were discussed with patient; she declines all other modalities. Risks of procedure discussed with patient including but not limited to: risk of regret, permanence of method, bleeding, infection, injury to surrounding organs and need for additional procedures.  Failure risk of 1-2% with increased risk of ectopic gestation if pregnancy occurs was also discussed with patient.  The patient concurred with the proposed plan, giving informed written consent for the procedures.    FINDINGS:  Viable female infant in cephalic presentation.  Apgars 8 and 9.  Clear amniotic fluid.  Intact placenta, three vessel cord.  Normal uterus, fallopian tubes and ovaries  bilaterally. Fallopian tubes sterilized via Pomeroy method bilaterally.  ANESTHESIA: Spinal INTRAVENOUS FLUIDS: 2300 ml ESTIMATED BLOOD LOSS: 445 ml URINE OUTPUT:  100 ml SPECIMENS: Placenta sent to L&D COMPLICATIONS: None immediate  PROCEDURE IN DETAIL:  The patient preoperatively received intravenous antibiotics and had sequential compression devices applied to her lower extremities.   She was then taken to the operating room where spinal anesthesia was administered and was found to be adequate. She was then placed in a dorsal supine position with a leftward tilt, and prepped and draped in a sterile manner.  A foley catheter was placed into her bladder and attached to constant gravity.  After an adequate timeout was performed, a Pfannenstiel skin incision was made with scalpel over her preexisting scar and carried through to the underlying layer of fascia. The fascia was clearly visualized on the patient's right side, and this incision was extended using the Mayo scissors. On the patient's left side there were significant scar tissue, and after careful dissection and without encountering the rectus muscle the peritoneum was sharply entered. The location of the foley bulb was then confirmed to be well inferior to the surgical field and no adherent bowel was palpated in the vicinity of opening that had been created. Subsequently the remaining fascia and scar tissue was taken down sharply while carefully elevating it. The Kocher clamps were then applied to the superior aspect of the fascial incision and the underlying rectus muscles were dissected off bluntly. A similar process was carried out on the inferior aspect of the fascial incision. The remaining rectus muscles and peritoneum were separated in the midline bluntly. Attention was turned to the lower uterine segment where a low transverse hysterotomy was made with a scalpel and extended bilaterally bluntly.  The infant was not well engaged and a Kiwi  vacuum was applied after which it was successfully delivered, the cord was clamped  and cut after one minute, and the infant was handed over to awaiting neonatology team. Uterine massage was then administered, and the placenta delivered intact with a three-vessel cord. The uterus was then cleared of clot and debris.  The hysterotomy was closed with 0 Vicryl in a running locked fashion, and an imbricating layer was also placed with 0 Vicryl.  One figure-of-eight 0 Vicryl serosal stitch was placed to help with hemostasis.  Attention was then turned to the fallopian tubes. First the right fallopian tube was grasped approximately 5cm from the cornua and elevated with Babcock. Two free ties of 0 plain gut were applied and then the tube was removed sharply with metzenbaum scissors. The pedicle was inspected and found to be hemostatic. The same process was then carried out on the left fallopian tube, also with excellent hemostasis. The pelvis was then cleared of all clot and debris. Hemostasis was confirmed on all surfaces.  The peritoneum was reapproximated using 2-0 Vicryl running stitch. The fascia was then closed using 0 Vicryl in a running fashion.  The subcutaneous layer was irrigated, then reapproximated with 2-0 plain gut interrupted stitches.  The skin was closed with a 4-0 Vicryl subcuticular stitch. The patient tolerated the procedure well. Sponge, lap, instrument and needle counts were correct x 3.  She was taken to the recovery room in stable condition.   Venora Maples, MD 10/23/2019 11:20 AM

## 2019-10-23 NOTE — H&P (Signed)
LABOR AND DELIVERY ADMISSION HISTORY AND PHYSICAL NOTE  Sheila Davis is a 39 y.o. female [id number] with IUP at [redacted]w[redacted]d by LMP c/w 19wk [place] presenting for scheduled repeat cesarean.   She reports positive fetal movement. She denies leakage of fluid or vaginal bleeding.   She denies headache, vision changes, chest pain, SOB, RUQ pain, LE edema.   She plans on bottle feeding. She requests BTL for birth control.  Prenatal History/Complications: PNC at [facility]:  @[redacted]w[redacted]d , CWD, normal anatomy, cephalic presentation, EFW 2281g, 58%ile, anterior placenta  Pregnancy complications:  - gHTN, diagnosed at [place] [facility] - COVID+ 10/03/2019 - obesity - alpha thal and SMA carrier  Past Medical History: Past Medical History:  Diagnosis Date  . Anxiety   . Depression    pcp  . History of stomach ulcers   . Migraines   . Seasonal allergies     Past Surgical History: Past Surgical History:  Procedure Laterality Date  . CESAREAN SECTION      2002 & 2006    Obstetrical History: OB History    Gravida  5   Para  2   Term  2   Preterm      AB  2   Living  2     SAB  2   TAB  0   Ectopic      Multiple      Live Births              Social History: Social History   Socioeconomic History  . Marital status: Single    Spouse name: Not on file  . Number of children: Not on file  . Years of education: Not on file  . Highest education level: Not on file  Occupational History  . Not on file  Tobacco Use  . Smoking status: Former Smoker    Packs/day: 0.20    Years: 0.50    Pack years: 0.10    Types: Cigarettes    Quit date: 09/02/2015    Years since quitting: 4.1  . Smokeless tobacco: Never Used  Substance and Sexual Activity  . Alcohol use: Not Currently    Alcohol/week: 0.0 standard drinks    Comment: occasionally  . Drug use: No  . Sexual activity: Not Currently    Partners: Male    Birth control/protection: Pill  Other Topics Concern  . Not on file   Social History Narrative  . Not on file   Social Determinants of Health   Financial Resource Strain:   . Difficulty of Paying Living Expenses: Not on file  Food Insecurity:   . Worried About Charity fundraiser in the Last Year: Not on file  . Ran Out of Food in the Last Year: Not on file  Transportation Needs:   . Lack of Transportation (Medical): Not on file  . Lack of Transportation (Non-Medical): Not on file  Physical Activity:   . Days of Exercise per Week: Not on file  . Minutes of Exercise per Session: Not on file  Stress:   . Feeling of Stress : Not on file  Social Connections:   . Frequency of Communication with Friends and Family: Not on file  . Frequency of Social Gatherings with Friends and Family: Not on file  . Attends Religious Services: Not on file  . Active Member of Clubs or Organizations: Not on file  . Attends Archivist Meetings: Not on file  . Marital Status: Not on file  Family History: Family History  Problem Relation Age of Onset  . Cancer - Ovarian Mother 49  . Heart disease Mother   . Cancer Mother   . Hypertension Father   . Diabetes Paternal Grandmother     Allergies: Allergies  Allergen Reactions  . Penicillins Rash    Has patient had a PCN reaction causing immediate rash, facial/tongue/throat swelling, SOB or lightheadedness with hypotension: no Has patient had a PCN reaction causing severe rash involving mucus membranes or skin necrosis: white puss Has patient had a PCN reaction that required hospitalization no Has patient had a PCN reaction occurring within the last 10 years: yes If all of the above answers are "NO", then may proceed with Cephalosporin use.     No medications prior to admission.     Review of Systems  All systems reviewed and negative except as stated in HPI  Physical Exam Last menstrual period 02/06/2019. General appearance: alert, oriented, NAD Lungs: normal respiratory effort Heart: regular  rate Abdomen: soft, non-tender; gravid Extremities: No calf swelling or tenderness FHR: 155  Prenatal labs: ABO, Rh: --/--/O POS, O POS Performed at [facility], [address], [place], [place] [address]  [phone]) Antibody: NEG (01/15 1800) Rubella: 6.01 (08/25 1431) RPR: NON REACTIVE (01/15 1800)  HBsAg: Negative (08/25 1431)  HIV: Non Reactive (11/17 1018)  GC/Chlamydia: neg/neg 10/20/2019  GBS:   in process 2-hr GTT: abnormal (101, 156, 150) Genetic screening:  Low risk panorama Anatomy US: normal  Prenatal Transfer Tool  Maternal Diabetes: Yes:  Diabetes Type:  Diet controlled Genetic Screening: Normal Maternal Ultrasounds/Referrals: Normal Fetal Ultrasounds or other Referrals:  None Maternal Substance Abuse:  No Significant Maternal Medications:  None Significant Maternal Lab Results: None  No results found for this or any previous visit (from the past 24 hour(s)).  Patient Active Problem List   Diagnosis Date Noted  . Unwanted fertility 10/20/2019  . Gestational hypertension 10/20/2019  . COVID-19 10/04/2019  . Gestational diabetes mellitus 08/25/2019  . Obesity in pregnancy 06/21/2019  . Alpha thalassemia silent carrier 06/09/2019  . Abnormal genetic test during pregnancy 06/09/2019  . History of 2 cesarean sections 05/31/2019  . Multigravida of advanced maternal age in second trimester 05/31/2019  . Abdominal hernia without obstruction and without gangrene 05/31/2019  . Headache in pregnancy, antepartum, second trimester 05/31/2019  . Nausea and vomiting during pregnancy prior to [redacted] weeks gestation 05/31/2019  . Supervision of other normal pregnancy, antepartum 05/24/2019  . Prediabetes 11/21/2017    Assessment: Sheila Davis is a 39 y.o. [id number] at [redacted]w[redacted]d here for scheduled RCS in setting of gHTN.  #Repeat CS: #Contraception:   The risks of cesarean section were discussed with the patient including but were not limited to: bleeding which may  require transfusion or reoperation; infection which may require antibiotics; injury to bowel, bladder, ureters or other surrounding organs; injury to the fetus; need for additional procedures including hysterectomy in the event of a life-threatening hemorrhage; placental abnormalities wth subsequent pregnancies, incisional problems, thromboembolic phenomenon and other postoperative/anesthesia complications.  Patient also desires permanent sterilization.  Other reversible forms of contraception were discussed with patient; she declines all other modalities. Risks of procedure discussed with patient including but not limited to: risk of regret, permanence of method, bleeding, infection, injury to surrounding organs and need for additional procedures.  Failure risk of about 1% with increased risk of ectopic gestation if pregnancy occurs was also discussed with patient.  Also discussed possibility of post-tubal  pain syndrome. The patient concurred with the proposed plan, giving informed written consent for the procedures, and had also previously signed BTL papers on 09/20/2019.  Patient has been NPO since 2100 the evening prior, she will remain NPO for procedure. Anesthesia and OR aware.  Preoperative prophylactic antibiotics and SCDs ordered on call to the OR.  To OR when ready.   #Anesthesia: Spinal #FWB: FHR 155 #GBS/ID: pending #COVID: swab positive on 10/03/2019, chart review shows she was symptomatic at that time so does not need precautions at this point as she is 21 days from diagnosis #MOF: Bottle #Circ: Yes, inpatient  #gHTN: Reports no symptoms. Repeat PIH labs on admission.   #GDMA1: Diet controlled, 2hr GTT at [facility] visit.   Mary Sella Semmes [facility] 10/23/2019, 8:41 AM

## 2019-10-23 NOTE — Discharge Summary (Signed)
Postpartum Discharge Summary  Patient Name: Sheila Davis DOB: 1980/12/19 MRN: [id number]  Date of admission: 10/23/2019 Delivering Provider: Woodroe Mode   Date of discharge: 10/25/2019  Admitting diagnosis: History of cesarean section [Z98.891] Intrauterine pregnancy: [redacted]w[redacted]d    Secondary diagnosis:  Active Problems:   Supervision of other normal pregnancy, antepartum   History of 2 cesarean sections   Multigravida of advanced maternal age in second trimester   Alpha thalassemia silent carrier   Abnormal genetic test during pregnancy   Obesity in pregnancy   Gestational diabetes mellitus   COVID-19   Unwanted fertility   Gestational hypertension   History of cesarean section  Additional problems:      Discharge diagnosis: Term Pregnancy Delivered, Gestational Hypertension and GDM A1                                                                                                Post partum procedures:postpartum tubal ligation  Augmentation: N/A  Complications: None  Hospital course:  Sceduled C/S   39y.o. yo [id number] admitted to the hospital 10/23/2019 for scheduled cesarean section with the following indication:Elective Repeat and gestational hypertension.  Membrane Rupture Time/Date: 10:33 AM ,10/23/2019   Patient delivered a Viable infant.10/23/2019  Details of operation can be found in separate operative note.    Pateint had a postpartum course notable for the following:  #gHTN: started on Nifedipine PP for mild range BP's, titrated up to '60mg'$  XL at time of discharge and had BP check scheduled for 11/02/2019 at time of discharge.  #Anxiety: seen by SW w/o barriers to discharge, requested to be restarted on Celexa '20mg'$  which was ordered at discharge  On day of discharge she is ambulating, tolerating a regular diet, passing flatus, and urinating well. Patient is discharged home in stable condition on  10/25/19        Delivery time: 10:35 AM    Magnesium  Sulfate received: No BMZ received: No Rhophylac:N/A MMR:N/A Transfusion:No  Physical exam  Vitals:   10/24/19 0511 10/24/19 1016 10/24/19 2133 10/25/19 0537  BP: (!) 151/95 (!) 136/98 (!) 140/93 (!) 143/98  Pulse: 96 87 87 (!) 104  Resp: '16  18 18  '$ Temp: 98.9 F (37.2 C)  98.7 F (37.1 C) 98 F (36.7 C)  TempSrc: Oral  Axillary Oral  SpO2: 98%  100% 97%  Weight:      Height:       General: alert, cooperative and no distress Lochia: appropriate Uterine Fundus: firm Incision: Healing well with no significant drainage, No significant erythema DVT Evaluation: No evidence of DVT seen on physical exam. No significant calf/ankle edema. Labs: Lab Results  Component Value Date   WBC 14.4 (H) 10/24/2019   HGB 9.6 (L) 10/24/2019   HCT 29.8 (L) 10/24/2019   MCV 85.6 10/24/2019   PLT 282 10/24/2019   CMP Latest Ref Rng & Units 10/23/2019  Glucose 70 - 99 mg/dL 105(H)  BUN 6 - 20 mg/dL 5(L)  Creatinine 0.44 - 1.00 mg/dL 0.74  Sodium 135 - 145 mmol/L 139  Potassium 3.5 - 5.1  mmol/L 4.2  Chloride 98 - 111 mmol/L 109  CO2 22 - 32 mmol/L 22  Calcium 8.9 - 10.3 mg/dL 9.1  Total Protein 6.5 - 8.1 g/dL 5.5(L)  Total Bilirubin 0.3 - 1.2 mg/dL 0.5  Alkaline Phos 38 - 126 U/L 128(H)  AST 15 - 41 U/L 18  ALT 0 - 44 U/L 17    Discharge instruction: per After Visit Summary and "Baby and Me Booklet".  After visit meds:  Allergies as of 10/25/2019      Reactions   Penicillins Rash   Has patient had a PCN reaction causing immediate rash, facial/tongue/throat swelling, SOB or lightheadedness with hypotension: no Has patient had a PCN reaction causing severe rash involving mucus membranes or skin necrosis: white puss Has patient had a PCN reaction that required hospitalization no Has patient had a PCN reaction occurring within the last 10 years: yes If all of the above answers are "NO", then may proceed with Cephalosporin use.      Medication List    STOP taking these medications    Accu-Chek FastClix Lancets Misc   Accu-Chek Guide test strip Generic drug: glucose blood   diphenhydramine-acetaminophen 25-500 MG Tabs tablet Commonly known as: TYLENOL PM   zolpidem 10 MG tablet Commonly known as: AMBIEN     TAKE these medications   acetaminophen 325 MG tablet Commonly known as: Tylenol Take 2 tablets (650 mg total) by mouth every 4 (four) hours as needed.   Blood Pressure Cuff Misc 1 Device by Does not apply route once a week.   Blood Pressure Kit Devi 1 kit by Does not apply route as needed.   butalbital-acetaminophen-caffeine 50-325-40 MG tablet Commonly known as: FIORICET Take 1-2 tablets by mouth every 6 (six) hours as needed for headache.   citalopram 20 MG tablet Commonly known as: CeleXA Take 1 tablet (20 mg total) by mouth daily.   ferrous sulfate 325 (65 FE) MG tablet Take 1 tablet (325 mg total) by mouth every other day. Start taking on: October 26, 2019   ibuprofen 600 MG tablet Commonly known as: ADVIL Take 1 tablet (600 mg total) by mouth every 6 (six) hours as needed for fever or headache.   metoCLOPramide 10 MG tablet Commonly known as: REGLAN Take 10 mg by mouth 4 (four) times daily.   NIFEdipine 60 MG 24 hr tablet Commonly known as: ADALAT CC Take 1 tablet (60 mg total) by mouth daily. Start taking on: October 26, 2019   omeprazole 20 MG capsule Commonly known as: PRILOSEC Take 20 mg by mouth daily.   ondansetron 4 MG tablet Commonly known as: Zofran Take 1 tablet (4 mg total) by mouth every 8 (eight) hours as needed for nausea or vomiting.   oxyCODONE 5 MG immediate release tablet Commonly known as: Roxicodone Take 1 tablet (5 mg total) by mouth every 4 (four) hours as needed for severe pain.   polyethylene glycol powder 17 GM/SCOOP powder Commonly known as: GLYCOLAX/MIRALAX Take 255 g by mouth once for 1 dose.   Prenate Mini 18-0.6-0.4-350 MG Caps Take 1 capsule by mouth daily.   promethazine 25 MG  tablet Commonly known as: PHENERGAN Take 0.5-1 tablets (12.5-25 mg total) by mouth every 6 (six) hours as needed for nausea.       Diet: carb modified diet  Activity: Advance as tolerated. Pelvic rest for 6 weeks.   Outpatient follow up:6 weeks Follow up Appt: Future Appointments  Date Time Provider [place]  11/02/2019  8:30  AM [place] None  12/06/2019  8:30 AM [facility] None  12/06/2019  8:45 AM [place] Mode, MD [facility] None   Follow up Visit:  Please schedule this patient for Postpartum visit in: 6 weeks with the following provider: Any provider In-person For C/S patients schedule nurse incision check in weeks 2 weeks: yes High risk pregnancy complicated by: gHTN, GDMA1, hx two prior CS Delivery mode:  CS Anticipated Birth Control:  BTL done PP PP Procedures needed: BP check, 2hr GTT, incision check  Schedule Integrated BH visit: no   Newborn Data: Live born female  Birth Weight:  2863 grams APGAR: 32, 9  Newborn Delivery   Birth date/time: 10/23/2019 10:35:00 Delivery type: C-Section, Vacuum Assisted Trial of labor: No C-section categorization: Repeat      Baby Feeding: Bottle Disposition:home with mother   10/25/2019 Clarnce Flock, MD

## 2019-10-23 NOTE — Progress Notes (Signed)
Patient arrived to preop unit.  Sheila Davis, [place] 10/23/2019 8:36 AM

## 2019-10-23 NOTE — Transfer of Care (Signed)
Immediate Anesthesia Transfer of Care Note  Patient: Sheila Davis  Procedure(s) Performed: CESAREAN SECTION WITH BILATERAL TUBAL LIGATION (Bilateral )  Patient Location: PACU  Anesthesia Type:Spinal  Level of Consciousness: awake, alert  and oriented  Airway & Oxygen Therapy: Patient Spontanous Breathing  Post-op Assessment: Report given to RN and Post -op Vital signs reviewed and stable  Post vital signs: Reviewed and stable  Last Vitals:  Vitals Value Taken Time  BP 123/70 10/23/19 1133  Temp    Pulse 81 10/23/19 1142  Resp 17 10/23/19 1142  SpO2 99 % 10/23/19 1142  Vitals shown include unvalidated device data.  Last Pain:  Vitals:   10/23/19 1135  TempSrc: (P) Oral  PainSc: (P) 0-No pain         Complications: No apparent anesthesia complications

## 2019-10-23 NOTE — Progress Notes (Signed)
Dr Debroah Loop notified of elevated BP

## 2019-10-23 NOTE — Progress Notes (Signed)
Upon entering room to round on patient she states she is anxious about postpartum depression and suffered with her first child silently. She states that she knows she needs medication for anxiety and depression but does not want her husband to know about it. She wishes to keep all conversations about anxiety and depression and treatment to a minimum in front of spouse but would like for physicians to be aware that she would prefer treatment for anxiety and depression as soon as possible.

## 2019-10-23 NOTE — Anesthesia Preprocedure Evaluation (Addendum)
Anesthesia Evaluation  Patient identified by MRN, date of birth, ID band Patient awake    Reviewed: Allergy & Precautions, NPO status , Patient's Chart, lab work & pertinent test results  History of Anesthesia Complications Negative for: history of anesthetic complications  Airway Mallampati: II  TM Distance: >3 FB Neck ROM: Full    Dental no notable dental hx.    Pulmonary former smoker,  Covid+ 10/03/19, mild symptoms   Pulmonary exam normal        Cardiovascular negative cardio ROS Normal cardiovascular exam     Neuro/Psych  Headaches, Anxiety Depression    GI/Hepatic negative GI ROS, Neg liver ROS,   Endo/Other  diabetes, Gestational  Renal/GU negative Renal ROS  negative genitourinary   Musculoskeletal negative musculoskeletal ROS (+)   Abdominal   Peds  Hematology Hgb 11.4, plts 316   Anesthesia Other Findings Day of surgery medications reviewed with patient.  Reproductive/Obstetrics (+) Pregnancy (Hx of C/S x2, gHTN)                           Anesthesia Physical Anesthesia Plan  ASA: III  Anesthesia Plan: Spinal   Post-op Pain Management:    Induction:   PONV Risk Score and Plan: 4 or greater and Treatment may vary due to age or medical condition, Ondansetron and Dexamethasone  Airway Management Planned: Natural Airway  Additional Equipment: None  Intra-op Plan:   Post-operative Plan:   Informed Consent: I have reviewed the patients History and Physical, chart, labs and discussed the procedure including the risks, benefits and alternatives for the proposed anesthesia with the patient or authorized representative who has indicated his/her understanding and acceptance.       Plan Discussed with: CRNA  Anesthesia Plan Comments:        Anesthesia Quick Evaluation

## 2019-10-24 LAB — CBC
HCT: 29.8 % — ABNORMAL LOW (ref 36.0–46.0)
Hemoglobin: 9.6 g/dL — ABNORMAL LOW (ref 12.0–15.0)
MCH: 27.6 pg (ref 26.0–34.0)
MCHC: 32.2 g/dL (ref 30.0–36.0)
MCV: 85.6 fL (ref 80.0–100.0)
Platelets: 282 10*3/uL (ref 150–400)
RBC: 3.48 MIL/uL — ABNORMAL LOW (ref 3.87–5.11)
RDW: 14.7 % (ref 11.5–15.5)
WBC: 14.4 10*3/uL — ABNORMAL HIGH (ref 4.0–10.5)
nRBC: 0 % (ref 0.0–0.2)

## 2019-10-24 LAB — STREP GP B SUSCEPTIBILITY

## 2019-10-24 LAB — GLUCOSE, CAPILLARY: Glucose-Capillary: 87 mg/dL (ref 70–99)

## 2019-10-24 LAB — STREP GP B CULTURE+RFLX: Strep Gp B Culture+Rflx: POSITIVE — AB

## 2019-10-24 MED ORDER — FERROUS SULFATE 325 (65 FE) MG PO TABS
325.0000 mg | ORAL_TABLET | ORAL | Status: DC
  Administered 2019-10-24: 325 mg via ORAL
  Filled 2019-10-24: qty 1

## 2019-10-24 MED ORDER — NIFEDIPINE ER OSMOTIC RELEASE 30 MG PO TB24
30.0000 mg | ORAL_TABLET | Freq: Every day | ORAL | Status: DC
  Administered 2019-10-24: 30 mg via ORAL
  Filled 2019-10-24: qty 1

## 2019-10-24 NOTE — Progress Notes (Signed)
POSTPARTUM PROGRESS NOTE  POD #1  Subjective:  Sheila Davis is a 39 y.o. [id number] s/p rLTCS at [redacted]w[redacted]d.  She reports she doing well. No acute events overnight. She denies any problems with ambulating, voiding or po intake. Denies nausea or vomiting. She has passed flatus. Pain is moderately controlled.  Lochia is appropriate.  Objective: Blood pressure (!) 151/95, pulse 96, temperature 98.9 F (37.2 C), temperature source Oral, resp. rate 16, height 5' 5.5" (1.664 m), weight 120.2 kg, last menstrual period 02/06/2019, SpO2 98 %, unknown if currently breastfeeding.  Physical Exam:  General: alert, cooperative and no distress Chest: no respiratory distress Heart:regular rate, distal pulses intact Abdomen: soft, nontender,  Uterine Fundus: firm, appropriately tender DVT Evaluation: No calf swelling or tenderness Extremities: no LE edema Skin: warm, dry; incision clean/dry/intact w/ pressure dressing in place  Recent Labs    10/23/19 1246 10/24/19 0340  HGB 11.0* 9.6*  HCT 35.2* 29.8*    Assessment/Plan: Sheila Davis is a 39 y.o. [id number] s/p rLTCS and BTL w [place] at [redacted]w[redacted]d for gHTN, GMDA1.  POD#1 - Doing welll; pain moderately controlled, encouraged to use opioids PRN to treat pain appropriately. H/H appropriate  Routine postpartum care  OOB, ambulated  Lovenox for VTE prophylaxis Anemia: asymptomatic  Start po ferrous sulfate BID gHTN: multiple higher end mild range BP's, start Nifedipine 30 XL today, reassess in afternoon for uptitration Contraception: BTL w [place] Feeding: bottle COVID+: mild symptoms but >21d from diagnosis, no longer on precautions  Circ: awaiting receipt, to be done later today or tomorrow SW: consult pending for hx anxiety/depression  Dispo: Plan for discharge POD#2-3.   LOS: 1 day   Zack Seal, MD/[facility] OB Fellow  10/24/2019, 10:05 AM

## 2019-10-24 NOTE — Progress Notes (Addendum)
CSW received consult for history of anxiety and depression. Consult notes that MOB has been tearful with high anxiety and depression. CSW met with MOB to offer support and complete assessment.    MOB sitting up in bed holding infant with FOB present, when CSW entered the room. CSW introduced self and received verbal permission to have FOB step out so that CSW could meet with MOB in private. CSW inquired about MOB's mental health history and MOB acknowledged a history of anxiety and depression that she has had "for a while". MOB shared she experienced anxiety and depression throughout her pregnancy with an increase in symptoms towards the end due to being diagnosed with gestational diabetes, hypertension and COVID. MOB reported she often feels an increase in her anxiety at night time due to previous COVID diagnosis that made it difficult for her to breath at night. MOB stated she was taking celexa and xanax prior to becoming pregnant but discontinued them during pregnancy. MOB interested in being restarted on medications. CSW offered to reach out to MD but MOB reported she would do it. 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 MOB acknowledged being tearful since giving birth. CSW provided education regarding the baby blues period vs. perinatal mood disorders, discussed treatment and gave resources for mental health follow up if concerns arise. CSW recommended self-evaluation during the postpartum time period using the New Mom Checklist from Postpartum Progress and encouraged MOB to contact a medical professional if symptoms are noted at any time. MOB did not appear to be displaying any acute mental health symptoms during assessment and denied any current SI, HI or DV. MOB reported primary supports as her sister, her best friend, FOB and her daughter. MOB agreeable to [facility] and [facility] referrals to offer additional support once  discharged.  MOB confirmed having all essential items for infant once discharged and stated infant would be sleeping in a bassinet once home. CSW provided review of Sudden Infant Death Syndrome (SIDS) precautions and safe sleeping habits.    CSW identifies no barriers to discharge at this time but will continue to follow to offer further support.  Sheila Dimock, LCSW [facility] [phone]  

## 2019-10-25 ENCOUNTER — Ambulatory Visit (HOSPITAL_COMMUNITY): Payer: Medicaid Other

## 2019-10-25 ENCOUNTER — Encounter (HOSPITAL_COMMUNITY): Payer: Self-pay

## 2019-10-25 LAB — SURGICAL PATHOLOGY

## 2019-10-25 MED ORDER — OXYCODONE HCL 5 MG PO TABS: 5.0000 mg | ORAL_TABLET | ORAL | 0 refills | Status: DC | PRN

## 2019-10-25 MED ORDER — NIFEDIPINE ER 60 MG PO TB24: 60.0000 mg | ORAL_TABLET | Freq: Every day | ORAL | 5 refills | Status: DC

## 2019-10-25 MED ORDER — ACETAMINOPHEN 325 MG PO TABS: 650.0000 mg | ORAL_TABLET | ORAL | 2 refills | Status: AC | PRN

## 2019-10-25 MED ORDER — CITALOPRAM HYDROBROMIDE 20 MG PO TABS: 20.0000 mg | ORAL_TABLET | Freq: Every day | ORAL | 2 refills | Status: DC

## 2019-10-25 MED ORDER — POLYETHYLENE GLYCOL 3350 17 GM/SCOOP PO POWD: 1.0000 | Freq: Once | ORAL | 0 refills | Status: AC

## 2019-10-25 MED ORDER — IBUPROFEN 600 MG PO TABS: 600.0000 mg | ORAL_TABLET | Freq: Four times a day (QID) | ORAL | 0 refills | Status: DC | PRN

## 2019-10-25 MED ORDER — FERROUS SULFATE 325 (65 FE) MG PO TABS: 325.0000 mg | ORAL_TABLET | ORAL | 3 refills | Status: DC

## 2019-10-25 MED ORDER — NIFEDIPINE ER OSMOTIC RELEASE 30 MG PO TB24
60.0000 mg | ORAL_TABLET | Freq: Every day | ORAL | Status: DC
  Administered 2019-10-25: 60 mg via ORAL
  Filled 2019-10-25: qty 2

## 2019-10-25 MED FILL — ACETAMINOPHEN 325 MG TABS: 325 | 13 days supply | Qty: 100 | Fill #0

## 2019-10-25 MED FILL — IBUPROFEN 600 MG TABLET: 600 | 7 days supply | Qty: 30 | Fill #0

## 2019-10-25 MED FILL — POLYETHYLENE GLYCOL 3350 PO: 17 | 14 days supply | Qty: 238 | Fill #0

## 2019-10-25 MED FILL — CITALOPRAM HBR 20 MG TABLET: 20 | 30 days supply | Qty: 30 | Fill #0

## 2019-10-25 MED FILL — NIFEdipine ER 60 MG TB24: 60 | 30 days supply | Qty: 30 | Fill #0

## 2019-10-25 MED FILL — FERROUS SULFATE 325 MG TAB: 325 (65 FE) | 60 days supply | Qty: 30 | Fill #0

## 2019-10-25 MED FILL — oxyCODONE HCL 5 MG TABS: 5 | 4 days supply | Qty: 20 | Fill #0

## 2019-10-25 NOTE — Discharge Instructions (Signed)
Cesarean Delivery, Care After This sheet gives you information about how to care for yourself after your procedure. Your health care provider may also give you more specific instructions. If you have problems or questions, contact your health care provider. What can I expect after the procedure? After the procedure, it is common to have:  A small amount of blood or clear fluid coming from the incision.  Some redness, swelling, and pain in your incision area.  Some abdominal pain and soreness.  Vaginal bleeding (lochia). Even though you did not have a vaginal delivery, you will still have vaginal bleeding and discharge.  Pelvic cramps.  Fatigue. You may have pain, swelling, and discomfort in the tissue between your vagina and your anus (perineum) if:  Your C-section was unplanned, and you were allowed to labor and push.  An incision was made in the area (episiotomy) or the tissue tore during attempted vaginal delivery. Follow these instructions at home: Incision care   Follow instructions from your health care provider about how to take care of your incision. Make sure you: ? Wash your hands with soap and water before you change your bandage (dressing). If soap and water are not available, use hand sanitizer. ? If you have a dressing, change it or remove it as told by your health care provider. ? Leave stitches (sutures), skin staples, skin glue, or adhesive strips in place. These skin closures may need to stay in place for 2 weeks or longer. If adhesive strip edges start to loosen and curl up, you may trim the loose edges. Do not remove adhesive strips completely unless your health care provider tells you to do that.  Check your incision area every day for signs of infection. Check for: ? More redness, swelling, or pain. ? More fluid or blood. ? Warmth. ? Pus or a bad smell.  Do not take baths, swim, or use a hot tub until your health care provider says it's okay. Ask your health  care provider if you can take showers.  When you cough or sneeze, hug a pillow. This helps with pain and decreases the chance of your incision opening up (dehiscing). Do this until your incision heals. Medicines  Take over-the-counter and prescription medicines only as told by your health care provider.  If you were prescribed an antibiotic medicine, take it as told by your health care provider. Do not stop taking the antibiotic even if you start to feel better.  Do not drive or use heavy machinery while taking prescription pain medicine. Lifestyle  Do not drink alcohol. This is especially important if you are breastfeeding or taking pain medicine.  Do not use any products that contain nicotine or tobacco, such as cigarettes, e-cigarettes, and chewing tobacco. If you need help quitting, ask your health care provider. Eating and drinking  Drink at least 8 eight-ounce glasses of water every day unless told not to by your health care provider. If you breastfeed, you may need to drink even more water.  Eat high-fiber foods every day. These foods may help prevent or relieve constipation. High-fiber foods include: ? Whole grain cereals and breads. ? Brown rice. ? Beans. ? Fresh fruits and vegetables. Activity   If possible, have someone help you care for your baby and help with household activities for at least a few days after you leave the hospital.  Return to your normal activities as told by your health care provider. Ask your health care provider what activities are safe for   you.  Rest as much as possible. Try to rest or take a nap while your baby is sleeping.  Do not lift anything that is heavier than 10 lbs (4.5 kg), or the limit that you were told, until your health care provider says that it is safe.  Talk with your health care provider about when you can engage in sexual activity. This may depend on your: ? Risk of infection. ? How fast you heal. ? Comfort and desire to  engage in sexual activity. General instructions  Do not use tampons or douches until your health care provider approves.  Wear loose, comfortable clothing and a supportive and well-fitting bra.  Keep your perineum clean and dry. Wipe from front to back when you use the toilet.  If you pass a blood clot, save it and call your health care provider to discuss. Do not flush blood clots down the toilet before you get instructions from your health care provider.  Keep all follow-up visits for you and your baby as told by your health care provider. This is important. Contact a health care provider if:  You have: ? A fever. ? Bad-smelling vaginal discharge. ? Pus or a bad smell coming from your incision. ? Difficulty or pain when urinating. ? A sudden increase or decrease in the frequency of your bowel movements. ? More redness, swelling, or pain around your incision. ? More fluid or blood coming from your incision. ? A rash. ? Nausea. ? Little or no interest in activities you used to enjoy. ? Questions about caring for yourself or your baby.  Your incision feels warm to the touch.  Your breasts turn red or become painful or hard.  You feel unusually sad or worried.  You vomit.  You pass a blood clot from your vagina.  You urinate more than usual.  You are dizzy or light-headed. Get help right away if:  You have: ? Pain that does not go away or get better with medicine. ? Chest pain. ? Difficulty breathing. ? Blurred vision or spots in your vision. ? Thoughts about hurting yourself or your baby. ? New pain in your abdomen or in one of your legs. ? A severe headache.  You faint.  You bleed from your vagina so much that you fill more than one sanitary pad in one hour. Bleeding should not be heavier than your heaviest period. Summary  After the procedure, it is common to have pain at your incision site, abdominal cramping, and slight bleeding from your vagina.  Check  your incision area every day for signs of infection.  Tell your health care provider about any unusual symptoms.  Keep all follow-up visits for you and your baby as told by your health care provider. This information is not intended to replace advice given to you by your health care provider. Make sure you discuss any questions you have with your health care provider. Document Revised: 03/31/2018 Document Reviewed: 03/31/2018 Elsevier Patient Education  2020 [employer]  

## 2019-10-25 NOTE — Progress Notes (Signed)
Post Partum Day 2 Subjective: up ad lib, voiding and tolerating PO  Patient is a 39 yo [id number] with history of gHTN and GDMA1 that complains of pain at her incision site, alleviated with norco.  Reports that she met with social work yesterday and that it went well, would just like to make sure that she has access to her anxiety medications when she is discharged.  Denies heavy vaginal bleeding, HA, blurry vision, CP, palpitations, SOB. She has no other complaints and is doing well.   Objective: Blood pressure (!) 143/98, pulse (!) 104, temperature 98 F (36.7 C), temperature source Oral, resp. rate 18, height 5' 5.5" (1.664 m), weight 120.2 kg, last menstrual period 02/06/2019, SpO2 97 %, unknown if currently breastfeeding.  Physical Exam:  General: alert, cooperative and no distress Lochia: appropriate Uterine Fundus: firm Incision: no significant drainage, no significant erythema DVT Evaluation: No evidence of DVT seen on physical exam. No cords or calf tenderness. No significant calf/ankle edema. Cardiopulmonary: Regular rate and rhythm, no wheezing rales or rhonchi.   Recent Labs    10/23/19 1246 10/24/19 0340  HGB 11.0* 9.6*  HCT 35.2* 29.8*    Assessment/Plan: Plan for discharge tomorrow  39 yo [id number] pod#2 s/p RCS and BTL, doing well.   GHTN: pressures remain elevated, increase dose of Nifedipine to 60. Asymptomatic anemia: continue PO ferrous sulfate  Depression/anxiety: social work consulted, follow recommendations.  Bottlefeeding. Pt reports she will pay for circumcision today.  Lovenox for DVT prophylaxis.  Contraception: BTL    LOS: 2 days   Ellin Goodie 10/25/2019, 8:05 AM

## 2019-10-26 ENCOUNTER — Inpatient Hospital Stay (HOSPITAL_COMMUNITY)
Admission: AD | Admit: 2019-10-26 | Discharge: 2019-10-26 | Disposition: A | Discharge status: Home / Self Care | Payer: Medicaid Other | Attending: Family Medicine | Admitting: Family Medicine

## 2019-10-26 ENCOUNTER — Encounter (HOSPITAL_COMMUNITY): Payer: Self-pay | Admitting: Family Medicine

## 2019-10-26 ENCOUNTER — Other Ambulatory Visit: Payer: Self-pay

## 2019-10-26 DIAGNOSIS — Z9889 Other specified postprocedural states: Secondary | ICD-10-CM | POA: Insufficient documentation

## 2019-10-26 DIAGNOSIS — Z87891 Personal history of nicotine dependence: Secondary | ICD-10-CM | POA: Insufficient documentation

## 2019-10-26 DIAGNOSIS — Z79899 Other long term (current) drug therapy: Secondary | ICD-10-CM | POA: Insufficient documentation

## 2019-10-26 DIAGNOSIS — Z8249 Family history of ischemic heart disease and other diseases of the circulatory system: Secondary | ICD-10-CM | POA: Diagnosis not present

## 2019-10-26 DIAGNOSIS — R002 Palpitations: Secondary | ICD-10-CM | POA: Diagnosis not present

## 2019-10-26 DIAGNOSIS — F419 Anxiety disorder, unspecified: Secondary | ICD-10-CM | POA: Diagnosis not present

## 2019-10-26 DIAGNOSIS — F329 Major depressive disorder, single episode, unspecified: Secondary | ICD-10-CM | POA: Diagnosis not present

## 2019-10-26 DIAGNOSIS — R519 Headache, unspecified: Secondary | ICD-10-CM

## 2019-10-26 DIAGNOSIS — Z880 Allergy status to penicillin: Secondary | ICD-10-CM | POA: Insufficient documentation

## 2019-10-26 DIAGNOSIS — O9089 Other complications of the puerperium, not elsewhere classified: Secondary | ICD-10-CM

## 2019-10-26 LAB — COMPREHENSIVE METABOLIC PANEL
ALT: 26 U/L (ref 0–44)
AST: 34 U/L (ref 15–41)
Albumin: 2.7 g/dL — ABNORMAL LOW (ref 3.5–5.0)
Alkaline Phosphatase: 113 U/L (ref 38–126)
Anion gap: 10 (ref 5–15)
BUN: 9 mg/dL (ref 6–20)
CO2: 25 mmol/L (ref 22–32)
Calcium: 9.1 mg/dL (ref 8.9–10.3)
Chloride: 102 mmol/L (ref 98–111)
Creatinine, Ser: 0.69 mg/dL (ref 0.44–1.00)
GFR calc Af Amer: >60 mL/min (ref >60)
GFR calc non Af Amer: >60 mL/min (ref >60)
Glucose, Bld: 102 mg/dL — ABNORMAL HIGH (ref 70–99)
Potassium: 3.9 mmol/L (ref 3.5–5.1)
Sodium: 137 mmol/L (ref 135–145)
Total Bilirubin: 0.3 mg/dL (ref 0.3–1.2)
Total Protein: 6.5 g/dL (ref 6.5–8.1)

## 2019-10-26 LAB — CBC
HCT: 34.2 % — ABNORMAL LOW (ref 36.0–46.0)
Hemoglobin: 10.4 g/dL — ABNORMAL LOW (ref 12.0–15.0)
MCH: 26.7 pg (ref 26.0–34.0)
MCHC: 30.4 g/dL (ref 30.0–36.0)
MCV: 87.7 fL (ref 80.0–100.0)
Platelets: 361 10*3/uL (ref 150–400)
RBC: 3.9 MIL/uL (ref 3.87–5.11)
RDW: 15.3 % (ref 11.5–15.5)
WBC: 12.2 10*3/uL — ABNORMAL HIGH (ref 4.0–10.5)
nRBC: 0 % (ref 0.0–0.2)

## 2019-10-26 MED ORDER — SODIUM CHLORIDE 0.9 % IV SOLN: INTRAVENOUS | Status: DC

## 2019-10-26 MED ORDER — IBUPROFEN 600 MG PO TABS
600.0000 mg | ORAL_TABLET | Freq: Once | ORAL | Status: AC
  Administered 2019-10-26: 600 mg via ORAL
  Filled 2019-10-26: qty 1

## 2019-10-26 MED ORDER — DEXAMETHASONE SODIUM PHOSPHATE 10 MG/ML IJ SOLN
10.0000 mg | Freq: Once | INTRAMUSCULAR | Status: AC
  Administered 2019-10-26: 10 mg via INTRAVENOUS
  Filled 2019-10-26: qty 1

## 2019-10-26 MED ORDER — METOCLOPRAMIDE HCL 5 MG/ML IJ SOLN
10.0000 mg | Freq: Once | INTRAMUSCULAR | Status: AC
  Administered 2019-10-26: 10 mg via INTRAVENOUS
  Filled 2019-10-26: qty 2

## 2019-10-26 MED ORDER — SODIUM CHLORIDE 0.9 % IV SOLN: INTRAVENOUS | Status: AC

## 2019-10-26 MED ORDER — AMLODIPINE BESYLATE 10 MG PO TABS: 10.0000 mg | ORAL_TABLET | Freq: Every day | ORAL | 11 refills | Status: DC

## 2019-10-26 MED ORDER — DIPHENHYDRAMINE HCL 50 MG/ML IJ SOLN
25.0000 mg | Freq: Once | INTRAMUSCULAR | Status: AC
  Administered 2019-10-26: 25 mg via INTRAVENOUS
  Filled 2019-10-26: qty 1

## 2019-10-26 MED ORDER — CYCLOBENZAPRINE HCL 10 MG PO TABS
10.0000 mg | ORAL_TABLET | Freq: Once | ORAL | Status: AC
  Administered 2019-10-26: 10 mg via ORAL
  Filled 2019-10-26: qty 1

## 2019-10-26 NOTE — MAU Provider Note (Addendum)
Chief Complaint: Headache   First Provider Initiated Contact with Patient 10/26/19 [phone]     SUBJECTIVE HPI: Sheila Davis is a 39 y.o. [id number] at 3 days postpartum who presents to Maternity Admissions reporting headache. She is 3 days s/p repeat cesarean. Symptoms started yesterday. Reports right sided headache that is throbbing. Gradual onset. Movement makes headache worse. Associated symptoms include nausea & feeling tired. Headache is not positional. Took 2 regular strength tylenol at 10 pm without relief. Has not taken any other medication nor has taken nausea medication for her symptoms. Had gestational hypertension with this pregnancy & was discharged home on nifedipine 60 mg daily.   Location: head Quality: throbbing Severity: 10/10 on pain scale Duration: 1 day Timing: constant Modifying factors: worse when gagging or moving Associated signs and symptoms: nausea, fatigue  Past Medical History:  Diagnosis Date  . Anxiety   . Depression    pcp  . History of stomach ulcers   . Migraines   . Seasonal allergies    OB History  Gravida Para Term Preterm AB Living  _0 SAB TAB Ectopic Multiple Live Births  2 0   0 1    # Outcome Date GA Lbr Len/2nd Weight Sex Delivery Anes PTL Lv  5 Term 10/23/19 [redacted]w[redacted]d 2863 g M CS-Vac Spinal  LIV  4 SAB 2017          3 SAB 06/03/13 [id number]       2 Term 10/02/05 [id number] g  CS-Classical     1 Term 09/01/01 [id number] 3969 g M CS-Classical        Complications: Polyhydramnios   Past Surgical History:  Procedure Laterality Date  . CESAREAN SECTION      2002 & 2006  . CESAREAN SECTION WITH BILATERAL TUBAL LIGATION Bilateral 10/23/2019   Procedure: CESAREAN SECTION WITH BILATERAL TUBAL LIGATION;  Surgeon: ArnoWoodroe Mode;  Location: [facility];  Service: Obstetrics;  Laterality: Bilateral;   Social History   Socioeconomic History  . Marital status: Single    Spouse name: Not on file  . Number of children: Not on file  .  Years of education: Not on file  . Highest education level: Not on file  Occupational History  . Not on file  Tobacco Use  . Smoking status: Former Smoker    Packs/day: 0.20    Years: 0.50    Pack years: 0.10    Types: Cigarettes    Quit date: 09/02/2015    Years since quitting: 4.1  . Smokeless tobacco: Never Used  Substance and Sexual Activity  . Alcohol use: Not Currently    Alcohol/week: 0.0 standard drinks    Comment: occasionally  . Drug use: No  . Sexual activity: Yes    Partners: Male    Birth control/protection: Pill  Other Topics Concern  . Not on file  Social History Narrative  . Not on file   Social Determinants of Health   Financial Resource Strain:   . Difficulty of Paying Living Expenses: Not on file  Food Insecurity:   . Worried About RunnCharity fundraiserthe Last Year: Not on file  . Ran Out of Food in the Last Year: Not on file  Transportation Needs:   . Lack of Transportation (Medical): Not on file  . Lack of Transportation (Non-Medical): Not on file  Physical Activity:   . Days of Exercise per Week: Not  on file  . Minutes of Exercise per Session: Not on file  Stress:   . Feeling of Stress : Not on file  Social Connections:   . Frequency of Communication with Friends and Family: Not on file  . Frequency of Social Gatherings with Friends and Family: Not on file  . Attends Religious Services: Not on file  . Active Member of Clubs or Organizations: Not on file  . Attends Archivist Meetings: Not on file  . Marital Status: Not on file  Intimate Partner Violence:   . Fear of Current or Ex-Partner: Not on file  . Emotionally Abused: Not on file  . Physically Abused: Not on file  . Sexually Abused: Not on file   Family History  Problem Relation Age of Onset  . Cancer - Ovarian Mother 41  . Heart disease Mother   . Cancer Mother   . Hypertension Father   . Diabetes Paternal Grandmother    No current facility-administered medications  on file prior to encounter.   Current Outpatient Medications on File Prior to Encounter  Medication Sig Dispense Refill  . acetaminophen (TYLENOL) 325 MG tablet Take 2 tablets (650 mg total) by mouth every 4 (four) hours as needed. 100 tablet 2  . butalbital-acetaminophen-caffeine (FIORICET) 50-325-40 MG tablet Take 1-2 tablets by mouth every 6 (six) hours as needed for headache. 20 tablet 0  . citalopram (CELEXA) 20 MG tablet Take 1 tablet (20 mg total) by mouth daily. 30 tablet 2  . ferrous sulfate 325 (65 FE) MG tablet Take 1 tablet (325 mg total) by mouth every other day. 30 tablet 3  . ibuprofen (ADVIL) 600 MG tablet Take 1 tablet (600 mg total) by mouth every 6 (six) hours as needed for fever or headache. 30 tablet 0  . metoCLOPramide (REGLAN) 10 MG tablet Take 10 mg by mouth 4 (four) times daily.    Marland Kitchen omeprazole (PRILOSEC) 20 MG capsule Take 20 mg by mouth daily.    Marland Kitchen oxyCODONE (ROXICODONE) 5 MG immediate release tablet Take 1 tablet (5 mg total) by mouth every 4 (four) hours as needed for severe pain. 20 tablet 0  . Prenat-FeCbn-FeAsp-Meth-FA-DHA (PRENATE MINI) 18-0.6-0.4-350 MG CAPS Take 1 capsule by mouth daily. 30 capsule 11  . promethazine (PHENERGAN) 25 MG tablet Take 0.5-1 tablets (12.5-25 mg total) by mouth every 6 (six) hours as needed for nausea. 30 tablet 3  . Blood Pressure Monitoring (BLOOD PRESSURE CUFF) MISC 1 Device by Does not apply route once a week. 1 each 0  . Blood Pressure Monitoring (BLOOD PRESSURE KIT) DEVI 1 kit by Does not apply route as needed. 1 kit 0  . NIFEdipine (ADALAT CC) 60 MG 24 hr tablet Take 1 tablet (60 mg total) by mouth daily. 30 tablet 5  . ondansetron (ZOFRAN) 4 MG tablet Take 1 tablet (4 mg total) by mouth every 8 (eight) hours as needed for nausea or vomiting. (Patient not taking: Reported on 10/25/2019) 20 tablet 0   Allergies  Allergen Reactions  . Penicillins Rash    Has patient had a PCN reaction causing immediate rash, facial/tongue/throat  swelling, SOB or lightheadedness with hypotension: no Has patient had a PCN reaction causing severe rash involving mucus membranes or skin necrosis: white puss Has patient had a PCN reaction that required hospitalization no Has patient had a PCN reaction occurring within the last 10 years: yes If all of the above answers are "NO", then may proceed with Cephalosporin use.  I have reviewed patient's Past Medical Hx, Surgical Hx, Family Hx, Social Hx, medications and allergies.   Review of Systems  Constitutional: Positive for fatigue.  Gastrointestinal: Positive for nausea.  Neurological: Positive for headaches.    OBJECTIVE Patient Vitals for the past 24 hrs:  BP Temp Temp src Pulse Resp SpO2 Height Weight  10/26/19 0635 134/85 -- -- (!) 109 -- -- -- --  10/26/19 0537 136/86 98.4 F (36.9 C) Oral (!) 114 18 99 % 5' 5.5" (1.664 m) 113.4 kg   Constitutional: Well-developed, well-nourished female in no acute distress.  Cardiovascular: normal rate & rhythm, no murmur Respiratory: normal rate and effort. Lung sounds clear throughout GI: Abd soft, non-tender, Pos BS x 4. No guarding or rebound tenderness MS: Extremities nontender, no edema, normal ROM Neurologic: Alert and oriented x 4.    LAB RESULTS Results for orders placed or performed during the hospital encounter of 10/26/19 (from the past 24 hour(s))  CBC     Status: Abnormal   Collection Time: 10/26/19  6:45 AM  Result Value Ref Range   WBC 12.2 (H) 4.0 - 10.5 K/uL   RBC 3.90 3.87 - 5.11 MIL/uL   Hemoglobin 10.4 (L) 12.0 - 15.0 g/dL   HCT 34.2 (L) 36.0 - 46.0 %   MCV 87.7 80.0 - 100.0 fL   MCH 26.7 26.0 - 34.0 pg   MCHC 30.4 30.0 - 36.0 g/dL   RDW 15.3 11.5 - 15.5 %   Platelets 361 150 - 400 K/uL   nRBC 0.0 0.0 - 0.2 %    IMAGING No results found.  MAU COURSE No orders of the defined types were placed in this encounter.  No orders of the defined types were placed in this encounter.   MDM Pt normotensive  in MAU. Suspect symptoms related to history of migraines. H/a is not positional.  IV headache cocktail given (decadron, benadryl, phenergan) with mild improvement down to 8/10. Ibuprofen 600 mg & flexeril 10 mg ordered.   CBC -- hemoglobin improved from time of discharge & is now up to 10.4  Care turned over to [facility], CNM  Jorje Guild, NP 10/26/2019  8:11 AM   Reassessment at 0900: Patient complains of "heart racing". EKG ordered. H/A resolving, now rates 5/10.  *Consult with Dr. Daneen Schick @ 1020 for review of EKG - notified of patient's complaints, assessments, and EKG results, he noted an incomplete RT bundle branch block, but not any different than what she had in 2016. He does NOT see any acute findings or any explanation for sinus tachycardia. He suggests that the patient may be having PACs or PVCs. Since they were not captured on the EKG, he cannot say for certain that is what is wrong with her now. -- ok to d/c home and follow-up as needed.  *Consult with Dr. Ernestina Patches @ 1030 - notified of patient's complaints, assessments, EKG results, and Dr. Thompson Caul interpretation of EKG - tx plan have patient stop taking Procardia and start Norvasc 5 mg or 10 mg - ok to d/c home, agrees with plan - advise to start on Norvasc 10 mg with BP check on Monday 1/25.  Assessment & Plan: Palpitations - Discussed cardiologist findings on EKG and no acute explanation for increased heart rate or possible PACs /PVCs - Rx for Norvasc 10 mg daily - Advised to stop taking Procardia - BP check on Monday 10/31/19 (can be virtual)  msg sent to admin pool at [place] to schedule - Information  provided on palpitations   Postpartum headache  - Reassurance given that H/A is more than likely a SE from Procardia - Information provided on generalized headache - Advised to go home, eat something, stay well-hydrated, increase rest for next few days, and monitor symptoms  - Discharge patient - Someone from  [facility] will contact you about f/u appt - Patient verbalized an understanding of the plan of care and agrees.     Laury Deep, CNM  10/26/2019 1:31 PM

## 2019-10-26 NOTE — MAU Note (Signed)
PT SAYS SHE DEL ON 1-17 BY C/S.   [facility] Tuesday AFTERNOON. HAD INCREASED BP WHILE HERE . HAS H/A - STARTED  Tuesday NIGHT. FEELS NAUSEA- ATE LAST  9PM. FEELS TIRED .  HAD COVID ON 12-28.

## 2019-10-26 NOTE — Progress Notes (Signed)
Pt now c/o heart palpitations, will notify provider.

## 2019-10-26 NOTE — Discharge Instructions (Signed)
Your symptoms are more than likely a result of taking Procardia, so I have changed you to a different blood pressure medication. Please pick that up at the pharmacy and take it as directed. You will need to have a blood pressure check in the office on Monday 10/31/2019. Someone from the office will call you to schedule that appointment.

## 2019-10-27 ENCOUNTER — Telehealth: Payer: Self-pay | Admitting: *Deleted

## 2019-10-27 NOTE — Telephone Encounter (Signed)
We cannot prescribe narcotics over the phone. Is there a reason why she cannot take the roxicodone? If she is having significant pain, she should go to MAU.   -KMD

## 2019-10-27 NOTE — Telephone Encounter (Signed)
Pt called to office stating that honeycomb came off her incision after it had gotten wet. Advised pt that is fine. Made aware that she may use a pad if needed to incision to help with any drainage the next couple days. Advise to keep clean and dry.  Pt also states that she was given Roxicodone on d/c but was taking Vicodin inpatient. Pt wants to know if she could get Rx for Vicodin for pain. Pt is currently taking Ibuprofen and Tylenol with little relief.  Pt made aware message to be sent to provider for recommendation and/or new Rx for pain.    Please advise and/or send new pain Rx to [facility] on [address].

## 2019-10-27 NOTE — Telephone Encounter (Signed)
She didn't seem to be in severe pain.  She just stated that the Roxi didn't seem to help as much as the Vicodin did. I advised her to continue with tylenol and Ibuprofen as well.  I did make her aware that she may or may not be given a different narcotic.

## 2019-10-31 ENCOUNTER — Ambulatory Visit: Payer: Medicaid Other

## 2019-10-31 DIAGNOSIS — Z0130 Encounter for examination of blood pressure without abnormal findings: Secondary | ICD-10-CM

## 2019-10-31 NOTE — Progress Notes (Signed)
Called pt to start visit for BP check, no answer, left vm

## 2019-11-02 ENCOUNTER — Ambulatory Visit: Payer: Medicaid Other

## 2019-11-17 ENCOUNTER — Encounter: Payer: Self-pay | Admitting: Obstetrics

## 2019-11-17 ENCOUNTER — Other Ambulatory Visit: Payer: Self-pay

## 2019-11-17 ENCOUNTER — Ambulatory Visit (INDEPENDENT_AMBULATORY_CARE_PROVIDER_SITE_OTHER): Payer: Medicaid Other | Admitting: Obstetrics

## 2019-11-17 DIAGNOSIS — F4001 Agoraphobia with panic disorder: Secondary | ICD-10-CM | POA: Diagnosis not present

## 2019-11-17 DIAGNOSIS — I10 Essential (primary) hypertension: Secondary | ICD-10-CM

## 2019-11-17 MED ORDER — ALPRAZOLAM 0.5 MG PO TABS: 0.5000 mg | ORAL_TABLET | Freq: Two times a day (BID) | ORAL | 2 refills | Status: DC | PRN

## 2019-11-17 NOTE — Progress Notes (Addendum)
Post Partum Exam  Sheila Davis is a 39 y.o. [phone] female who presents for a postpartum visit. She is 4 weeks postpartum following a low cervical transverse Cesarean section. I have fully reviewed the prenatal and intrapartum course. The delivery was at 37 gestational weeks.  Anesthesia: spinal. Postpartum course has been marked by patient reporting anxiety and feeling overwhelmed. Baby's course has been normal. Baby is feeding by bottle - Carn[place] Good Start. Bleeding no bleeding. Bowel function is normal. Bladder function is normal. Patient is not sexually active. Contraception method is tubal ligation. Postpartum depression screening:pos (score:23)  The following portions of the patient's history were reviewed and updated as appropriate: allergies, current medications, past family history, past medical history, past social history, past surgical history and problem list. Last pap smear done 11/19/2017 and was Normal  Review of Systems A comprehensive review of systems was negative except for: Behavioral/Psych: positive for anxiety, depression and panic attacks with agoraphobia     Objective:  Weight 226 lb 1.6 oz (102.6 kg), unknown if currently breastfeeding.  General:  alert and no distress   Breasts:  inspection negative, no nipple discharge or bleeding, no masses or nodularity palpable  Lungs: clear to auscultation bilaterally  Heart:  regular rate and rhythm, S1, S2 normal, no murmur, click, rub or gallop  Abdomen: soft, non-tender; bowel sounds normal; no masses,  no organomegaly and incision is clean, dry and intact   Assessment:   1. Postpartum examination following cesarean delivery  2. Agoraphobia with panic attacks Rx: - ALPRAZolam (XANAX) 0.5 MG tablet; Take 1 tablet (0.5 mg total) by mouth 2 (two) times daily as needed for anxiety.  Dispense: 60 tablet; Refill: 2  3. HTN (hypertension), benign - managed by PCP  Plan:   1. Contraception: tubal ligation 2. Continue  meds Rx by PCP and keep appointments with PCP and [facility] 3. Follow up in: 6 weeks or as needed.   Brock Bad, MD 11/17/2019 9:49 AM

## 2019-12-06 ENCOUNTER — Other Ambulatory Visit: Payer: Medicaid Other

## 2019-12-06 ENCOUNTER — Ambulatory Visit: Payer: Medicaid Other | Admitting: Obstetrics & Gynecology

## 2019-12-06 ENCOUNTER — Other Ambulatory Visit: Payer: Self-pay

## 2019-12-07 LAB — GLUCOSE TOLERANCE, 2 HOURS
Glucose, 2 hour: 110 mg/dL (ref 65–139)
Glucose, GTT - Fasting: 88 mg/dL (ref 65–99)

## 2019-12-29 ENCOUNTER — Encounter: Payer: Self-pay | Admitting: *Deleted

## 2019-12-29 ENCOUNTER — Ambulatory Visit: Payer: Medicaid Other | Admitting: Obstetrics

## 2020-01-05 ENCOUNTER — Other Ambulatory Visit: Payer: Self-pay

## 2020-01-05 ENCOUNTER — Ambulatory Visit (INDEPENDENT_AMBULATORY_CARE_PROVIDER_SITE_OTHER): Payer: Medicaid Other | Admitting: Obstetrics

## 2020-01-05 ENCOUNTER — Encounter: Payer: Self-pay | Admitting: Obstetrics

## 2020-01-05 ENCOUNTER — Other Ambulatory Visit (HOSPITAL_COMMUNITY)
Admission: RE | Admit: 2020-01-05 | Discharge: 2020-01-05 | Disposition: A | Discharge status: Home / Self Care | Payer: Medicaid Other | Source: Ambulatory Visit | Attending: Obstetrics | Admitting: Obstetrics

## 2020-01-05 VITALS — BP 127/89 | HR 83 | Ht 65.0 in | Wt 232.3 lb

## 2020-01-05 DIAGNOSIS — F53 Postpartum depression: Secondary | ICD-10-CM | POA: Diagnosis not present

## 2020-01-05 DIAGNOSIS — O906 Postpartum mood disturbance: Secondary | ICD-10-CM | POA: Diagnosis not present

## 2020-01-05 DIAGNOSIS — N898 Other specified noninflammatory disorders of vagina: Secondary | ICD-10-CM

## 2020-01-05 NOTE — Progress Notes (Addendum)
   GYNECOLOGY OFFICE VISIT NOTE  History:   Sheila Davis is a 39 y.o. [id number] here today for PPD followup. PPD score 16 today  Reports non-odorous thin white discharge of a mucus-like consistence, and mild itching. She denies any bleeding, pelvic pain or other concerns.    Past Medical History:  Diagnosis Date  . Anxiety   . Depression    pcp  . History of stomach ulcers   . Migraines   . Seasonal allergies     Past Surgical History:  Procedure Laterality Date  . CESAREAN SECTION      2002 & 2006  . CESAREAN SECTION WITH BILATERAL TUBAL LIGATION Bilateral 10/23/2019   Procedure: CESAREAN SECTION WITH BILATERAL TUBAL LIGATION;  Surgeon: Adam Phenix, MD;  Location: [facility] LD ORS;  Service: Obstetrics;  Laterality: Bilateral;    The following portions of the patient's history were reviewed and updated as appropriate: allergies, current medications, past family history, past medical history, past social history, past surgical history and problem list.   Health Maintenance:  Normal pap and negative HRHPV on 11/19/17.    Review of Systems:  Pertinent items noted in HPI and remainder of comprehensive ROS otherwise negative.  Physical Exam:  BP 127/89   Pulse 83   Ht 5\' 5"  (1.651 m)   Wt 232 lb 4.8 oz (105.4 kg)   Breastfeeding No   BMI 38.66 kg/m  CONSTITUTIONAL: Well-developed, well-nourished female in no acute distress.  HEENT:  Normocephalic, atraumatic. External right and left ear normal. No scleral icterus.  NECK: Normal range of motion, supple, no masses noted on observation SKIN: No rash noted. Not diaphoretic. No erythema. No pallor. MUSCULOSKELETAL: Normal range of motion. No edema noted. NEUROLOGIC: Alert and oriented to person, place, and time. Normal muscle tone coordination. No cranial nerve deficit noted. PSYCHIATRIC: Normal mood and affect. Normal behavior. Normal judgment and thought content. CARDIOVASCULAR: Normal heart rate noted RESPIRATORY: Effort and breath  sounds normal, no problems with respiration noted ABDOMEN: No masses noted. No other overt distention noted.   PELVIC: Deferred  Labs and Imaging No results found for this or any previous visit (from the past 168 hour(s)). No results found.     Assessment and Plan:      1. Postpartum depression followup -PPD score 16 today -continue on Celexa 20 MG tablet    2. Vaginal discharge - Cervicovaginal ancillary only( [place])   Routine preventative health maintenance measures emphasized. Please refer to After Visit Summary for other counseling recommendations.   Return in about 4 weeks (around 02/02/2020).    Total face-to-face time with patient: 15 minutes.  Over 50% of encounter was spent on counseling and coordination of care.  Attestation of Attending Supervision of Advanced Practice Provider (PA/CNM/NP): Evaluation and management procedures were performed by the Advanced Practice Provider under my supervision and collaboration.  I have reviewed the Advanced Practice Provider's note and chart, and I agree with the management and plan. I have also made any necessary editorial changes.   02/04/2020, MD, FACOG Attending Obstetrician & Gynecologist, [facility] for [facility], [facility], MD 4:43 PM

## 2020-01-05 NOTE — Progress Notes (Signed)
Patient presents for PP depression follow up. Her PPD score today was 16. Patient is currently taking xanax for anxiety and panic disorder  And taking Celexa for depression. She has noticed a difference with the increase in the Celexa dosage.   Patient also states that she has not had a cycle since delivery. Also complains of having a vaginal discharge with itching, but no odor.

## 2020-01-06 LAB — CERVICOVAGINAL ANCILLARY ONLY
Bacterial Vaginitis (gardnerella): NEGATIVE
Candida Glabrata: NEGATIVE
Candida Vaginitis: NEGATIVE
Comment: NEGATIVE
Comment: NEGATIVE
Comment: NEGATIVE

## 2020-02-02 ENCOUNTER — Ambulatory Visit: Payer: Medicaid Other | Admitting: Obstetrics

## 2020-02-21 ENCOUNTER — Ambulatory Visit: Payer: Medicaid Other | Admitting: Obstetrics

## 2020-02-24 ENCOUNTER — Encounter: Payer: Self-pay | Admitting: Obstetrics

## 2020-02-24 ENCOUNTER — Ambulatory Visit (INDEPENDENT_AMBULATORY_CARE_PROVIDER_SITE_OTHER): Payer: Medicaid Other | Admitting: Obstetrics

## 2020-02-24 ENCOUNTER — Other Ambulatory Visit: Payer: Self-pay

## 2020-02-24 ENCOUNTER — Other Ambulatory Visit (HOSPITAL_COMMUNITY)
Admission: RE | Admit: 2020-02-24 | Discharge: 2020-02-24 | Disposition: A | Discharge status: Home / Self Care | Payer: Medicaid Other | Source: Ambulatory Visit | Attending: Obstetrics | Admitting: Obstetrics

## 2020-02-24 VITALS — BP 125/87 | HR 85 | Ht 65.0 in | Wt 247.0 lb

## 2020-02-24 DIAGNOSIS — F419 Anxiety disorder, unspecified: Secondary | ICD-10-CM

## 2020-02-24 DIAGNOSIS — F4001 Agoraphobia with panic disorder: Secondary | ICD-10-CM

## 2020-02-24 DIAGNOSIS — Z0000 Encounter for general adult medical examination without abnormal findings: Secondary | ICD-10-CM

## 2020-02-24 DIAGNOSIS — R350 Frequency of micturition: Secondary | ICD-10-CM

## 2020-02-24 DIAGNOSIS — N898 Other specified noninflammatory disorders of vagina: Secondary | ICD-10-CM | POA: Diagnosis not present

## 2020-02-24 DIAGNOSIS — N764 Abscess of vulva: Secondary | ICD-10-CM

## 2020-02-24 DIAGNOSIS — Z01419 Encounter for gynecological examination (general) (routine) without abnormal findings: Secondary | ICD-10-CM

## 2020-02-24 DIAGNOSIS — N760 Acute vaginitis: Secondary | ICD-10-CM

## 2020-02-24 DIAGNOSIS — F32A Depression, unspecified: Secondary | ICD-10-CM

## 2020-02-24 DIAGNOSIS — B9689 Other specified bacterial agents as the cause of diseases classified elsewhere: Secondary | ICD-10-CM

## 2020-02-24 LAB — POCT URINALYSIS DIPSTICK
Bilirubin, UA: NEGATIVE
Blood, UA: NEGATIVE
Glucose, UA: NEGATIVE
Ketones, UA: NEGATIVE
Leukocytes, UA: NEGATIVE
Nitrite, UA: NEGATIVE
Protein, UA: NEGATIVE
Spec Grav, UA: 1.015 (ref 1.010–1.025)
Urobilinogen, UA: 0.2 E.U./dL
pH, UA: 7 (ref 5.0–8.0)

## 2020-02-24 MED ORDER — CLINDAMYCIN PHOSPHATE 1 % EX LOTN: TOPICAL_LOTION | Freq: Two times a day (BID) | CUTANEOUS | 4 refills | Status: DC

## 2020-02-24 MED ORDER — TINIDAZOLE 500 MG PO TABS: 1000.0000 mg | ORAL_TABLET | Freq: Every day | ORAL | 2 refills | Status: DC

## 2020-02-24 MED ORDER — ALPRAZOLAM 0.5 MG PO TABS: 0.5000 mg | ORAL_TABLET | Freq: Two times a day (BID) | ORAL | 2 refills | Status: AC | PRN

## 2020-02-24 MED ORDER — PRENATE MINI 29-0.6-0.4-350 MG PO CAPS: 1.0000 | ORAL_CAPSULE | Freq: Every day | ORAL | 3 refills | Status: DC

## 2020-02-24 NOTE — Progress Notes (Signed)
Subjective:        Sheila Davis is a 39 y.o. female here for a routine exam.  Current complaints: Continues to have anxiety / depression with some improvement with Celexa and counseling.    Personal health questionnaire:  Is patient [name], have a family history of breast and/or ovarian cancer: yes Is there a family history of uterine cancer diagnosed at age < 75, gastrointestinal cancer, urinary tract cancer, family member who is a Field seismologist syndrome-associated carrier: no Is the patient overweight and hypertensive, family history of diabetes, personal history of gestational diabetes, preeclampsia or PCOS: no Is patient over 30, have PCOS,  family history of premature CHD under age 63, diabetes, smoke, have hypertension or peripheral artery disease:  no At any time, has a partner hit, kicked or otherwise hurt or frightened you?: no Over the past 2 weeks, have you felt down, depressed or hopeless?: no Over the past 2 weeks, have you felt little interest or pleasure in doing things?:no   Gynecologic History Patient's last menstrual period was 01/29/2020. Contraception: tubal ligation Last Pap: 11-19-2017. Results were: normal Last mammogram: n/a. Results were: n/a  Obstetric History OB History  Gravida Para Term Preterm AB Living  _0 SAB TAB Ectopic Multiple Live Births  2 0   0 3    # Outcome Date GA Lbr Len/2nd Weight Sex Delivery Anes PTL Lv  5 Term 10/23/19 [redacted]w[redacted]d 6 lb 5 oz (2.863 kg) M CS-Vac Spinal  LIV  4 SAB 2017          3 SAB 06/03/13 836w0d       2 Term 10/02/05 3813w0d lb (3.175 kg)  CS-Classical     1 Term 09/01/01 [id number] 8 lb 12 oz (3.969 kg) M CS-Classical        Complications: Polyhydramnios    Past Medical History:  Diagnosis Date  . Anxiety   . Depression    pcp  . History of stomach ulcers   . Migraines   . Seasonal allergies     Past Surgical History:  Procedure Laterality Date  . CESAREAN SECTION      2002 & 2006  .  CESAREAN SECTION WITH BILATERAL TUBAL LIGATION Bilateral 10/23/2019   Procedure: CESAREAN SECTION WITH BILATERAL TUBAL LIGATION;  Surgeon: ArnoWoodroe Mode;  Location: [facility];  Service: Obstetrics;  Laterality: Bilateral;     Current Outpatient Medications:  .  acetaminophen (TYLENOL) 325 MG tablet, Take 2 tablets (650 mg total) by mouth every 4 (four) hours as needed., Disp: 100 tablet, Rfl: 2 .  ALPRAZolam (XANAX) 0.5 MG tablet, Take 1 tablet (0.5 mg total) by mouth 2 (two) times daily as needed for anxiety., Disp: 60 tablet, Rfl: 2 .  atenolol-chlorthalidone (TENORETIC) 50-25 MG tablet, Take 1 tablet by mouth daily., Disp: , Rfl:  .  Blood Pressure Monitoring (BLOOD PRESSURE CUFF) MISC, 1 Device by Does not apply route once a week., Disp: 1 each, Rfl: 0 .  Blood Pressure Monitoring (BLOOD PRESSURE KIT) DEVI, 1 kit by Does not apply route as needed., Disp: 1 kit, Rfl: 0 .  butalbital-acetaminophen-caffeine (FIORICET) 50-325-40 MG tablet, Take 1-2 tablets by mouth every 6 (six) hours as needed for headache., Disp: 20 tablet, Rfl: 0 .  metoCLOPramide (REGLAN) 10 MG tablet, Take 10 mg by mouth 4 (four) times daily., Disp: , Rfl:  .  ondansetron (ZOFRAN) 4 MG tablet, Take 1  tablet (4 mg total) by mouth every 8 (eight) hours as needed for nausea or vomiting., Disp: 20 tablet, Rfl: 0 .  pantoprazole (PROTONIX) 20 MG tablet, Take 20 mg by mouth daily., Disp: , Rfl:  .  citalopram (CELEXA) 20 MG tablet, Take 1 tablet (20 mg total) by mouth daily. (Patient not taking: Reported on 02/24/2020), Disp: 30 tablet, Rfl: 2 .  clindamycin (CLEOCIN-T) 1 % lotion, Apply topically 2 (two) times daily., Disp: 60 mL, Rfl: 4 .  Prenat w/o A-FeCbn-Meth-FA-DHA (PRENATE MINI) 29-0.6-0.4-350 MG CAPS, Take 1 capsule by mouth daily before breakfast., Disp: 90 capsule, Rfl: 3 .  tinidazole (TINDAMAX) 500 MG tablet, Take 2 tablets (1,000 mg total) by mouth daily with breakfast., Disp: 10 tablet, Rfl: 2 Allergies  Allergen  Reactions  . Penicillins Rash    Has patient had a PCN reaction causing immediate rash, facial/tongue/throat swelling, SOB or lightheadedness with hypotension: no Has patient had a PCN reaction causing severe rash involving mucus membranes or skin necrosis: white puss Has patient had a PCN reaction that required hospitalization no Has patient had a PCN reaction occurring within the last 10 years: yes If all of the above answers are "NO", then may proceed with Cephalosporin use.     Social History   Tobacco Use  . Smoking status: Former Smoker    Packs/day: 0.20    Years: 0.50    Pack years: 0.10    Types: Cigarettes    Quit date: 09/02/2015    Years since quitting: 4.4  . Smokeless tobacco: Never Used  Substance Use Topics  . Alcohol use: Not Currently    Alcohol/week: 0.0 standard drinks    Comment: occasionally    Family History  Problem Relation Age of Onset  . Cancer - Ovarian Mother 49  . Heart disease Mother   . Cancer Mother   . Hypertension Father   . Diabetes Paternal Grandmother       Review of Systems  Constitutional: negative for fatigue and weight loss Respiratory: negative for cough and wheezing Cardiovascular: negative for chest pain, fatigue and palpitations Gastrointestinal: negative for abdominal pain and change in bowel habits Musculoskeletal:negative for myalgias Neurological: negative for gait problems and tremors Behavioral/Psych: negative for abusive relationship, depression Endocrine: negative for temperature intolerance    Genitourinary:negative for abnormal menstrual periods, genital lesions, hot flashes, sexual problems and vaginal discharge Integument/breast: negative for breast lump, breast tenderness, nipple discharge and skin lesion(s)    Objective:       BP 125/87   Pulse 85   Ht _0  (1.651 m)   Wt 247 lb (112 kg)   LMP 01/29/2020   Breastfeeding No   BMI 41.10 kg/m  General:   alert  Skin:   no rash or abnormalities   Lungs:   clear to auscultation bilaterally  Heart:   regular rate and rhythm, S1, S2 normal, no murmur, click, rub or gallop  Breasts:   normal without suspicious masses, skin or nipple changes or axillary nodes  Abdomen:  normal findings: no organomegaly, soft, non-tender and no hernia  Pelvis:  External genitalia: normal general appearance Urinary system: urethral meatus normal and bladder without fullness, nontender Vaginal: normal without tenderness, induration or masses Cervix: normal appearance Adnexa: normal bimanual exam Uterus: anteverted and non-tender, normal size   Lab Review Urine pregnancy test Labs reviewed yes Radiologic studies reviewed no  50% of 20 min visit spent on counseling and coordination of care.   Assessment:  1. Encounter for routine gynecological examination with Papanicolaou smear of cervix  2. Vaginal discharge Rx: - Cervicovaginal ancillary only( [place])  3. Anxiety and depression - continue Celexa and Counseling  4. Boil of vulva Rx: - clindamycin (CLEOCIN-T) 1 % lotion; Apply topically 2 (two) times daily.  Dispense: 60 mL; Refill: 4  5. BV (bacterial vaginosis) Rx: - tinidazole (TINDAMAX) 500 MG tablet; Take 2 tablets (1,000 mg total) by mouth daily with breakfast.  Dispense: 10 tablet; Refill: 2  6. Agoraphobia with panic attacks Rx: - ALPRAZolam (XANAX) 0.5 MG tablet; Take 1 tablet (0.5 mg total) by mouth 2 (two) times daily as needed for anxiety.  Dispense: 60 tablet; Refill: 2 - continue Xanax for now, but patient informed that this cannot be given long-term.  She is being referred to Psychiatry by the Counseling Services   7. Routine adult health maintenance Rx: - Prenat w/o A-FeCbn-Meth-FA-DHA (PRENATE MINI) 29-0.6-0.4-350 MG CAPS; Take 1 capsule by mouth daily before breakfast.  Dispense: 90 capsule; Refill: 3  8. Urinary frequency Rx: - POCT Urinalysis Dipstick: negative   Plan:    Education reviewed: calcium  supplements, depression evaluation, low fat, low cholesterol diet, safe sex/STD prevention, self breast exams and weight bearing exercise. Follow up in: 1 year.   Meds ordered this encounter  Medications  . clindamycin (CLEOCIN-T) 1 % lotion    Sig: Apply topically 2 (two) times daily.    Dispense:  60 mL    Refill:  4  . tinidazole (TINDAMAX) 500 MG tablet    Sig: Take 2 tablets (1,000 mg total) by mouth daily with breakfast.    Dispense:  10 tablet    Refill:  2  . ALPRAZolam (XANAX) 0.5 MG tablet    Sig: Take 1 tablet (0.5 mg total) by mouth 2 (two) times daily as needed for anxiety.    Dispense:  60 tablet    Refill:  2  . Prenat w/o A-FeCbn-Meth-FA-DHA (PRENATE MINI) 29-0.6-0.4-350 MG CAPS    Sig: Take 1 capsule by mouth daily before breakfast.    Dispense:  90 capsule    Refill:  3   Orders Placed This Encounter  Procedures  . POCT Urinalysis Dipstick    Shelly Bombard, MD 02/24/2020 10:37 AM

## 2020-02-24 NOTE — Progress Notes (Signed)
Called Pharmacy to change PNV strength as the one we prescribed was discontinued.

## 2020-02-24 NOTE — Progress Notes (Signed)
Patient presents for PPD follow up. Patient complains of having some boils on her vagina and vaginal discharge with an odor. Sx has been present for about a week. She also complains that she is having pressure when she urinates, frequency, and back pain.

## 2020-02-27 ENCOUNTER — Other Ambulatory Visit (HOSPITAL_COMMUNITY)
Admission: RE | Admit: 2020-02-27 | Discharge: 2020-02-27 | Disposition: A | Discharge status: Home / Self Care | Payer: Medicaid Other | Source: Ambulatory Visit | Attending: Obstetrics | Admitting: Obstetrics

## 2020-02-27 ENCOUNTER — Other Ambulatory Visit: Payer: Self-pay

## 2020-02-27 DIAGNOSIS — N898 Other specified noninflammatory disorders of vagina: Secondary | ICD-10-CM | POA: Diagnosis not present

## 2020-02-27 LAB — CERVICOVAGINAL ANCILLARY ONLY
Bacterial Vaginitis (gardnerella): NEGATIVE
Candida Glabrata: NEGATIVE
Candida Vaginitis: NEGATIVE
Chlamydia: NEGATIVE
Comment: NEGATIVE
Comment: NEGATIVE
Comment: NEGATIVE
Comment: NEGATIVE
Comment: NEGATIVE
Comment: NORMAL
Neisseria Gonorrhea: NEGATIVE
Trichomonas: NEGATIVE

## 2020-02-27 NOTE — Addendum Note (Signed)
Addended byFrutoso Chase on: 02/27/2020 02:03 PM   Modules accepted: Orders

## 2020-02-29 LAB — CYTOLOGY - PAP
Comment: NEGATIVE
Diagnosis: NEGATIVE
High risk HPV: NEGATIVE

## 2020-07-25 ENCOUNTER — Other Ambulatory Visit (HOSPITAL_COMMUNITY)
Admission: RE | Admit: 2020-07-25 | Discharge: 2020-07-25 | Disposition: A | Discharge status: Home / Self Care | Payer: BC Managed Care – PPO | Source: Ambulatory Visit | Attending: Obstetrics | Admitting: Obstetrics

## 2020-07-25 ENCOUNTER — Other Ambulatory Visit: Payer: Self-pay | Admitting: Obstetrics

## 2020-07-25 ENCOUNTER — Ambulatory Visit (INDEPENDENT_AMBULATORY_CARE_PROVIDER_SITE_OTHER): Payer: Medicaid Other | Admitting: Obstetrics

## 2020-07-25 ENCOUNTER — Other Ambulatory Visit: Payer: Self-pay

## 2020-07-25 ENCOUNTER — Encounter: Payer: Self-pay | Admitting: Obstetrics

## 2020-07-25 VITALS — BP 124/77 | HR 85 | Wt 245.0 lb

## 2020-07-25 DIAGNOSIS — N898 Other specified noninflammatory disorders of vagina: Secondary | ICD-10-CM

## 2020-07-25 DIAGNOSIS — N644 Mastodynia: Secondary | ICD-10-CM | POA: Diagnosis not present

## 2020-07-25 DIAGNOSIS — N6320 Unspecified lump in the left breast, unspecified quadrant: Secondary | ICD-10-CM

## 2020-07-25 DIAGNOSIS — Z6841 Body Mass Index (BMI) 40.0 and over, adult: Secondary | ICD-10-CM

## 2020-07-25 MED ORDER — FLUCONAZOLE 150 MG PO TABS: 150.0000 mg | ORAL_TABLET | Freq: Once | ORAL | 1 refills | Status: AC

## 2020-07-25 MED ORDER — CLOTRIMAZOLE 1 % EX CREA: 1.0000 "application " | TOPICAL_CREAM | Freq: Two times a day (BID) | CUTANEOUS | 1 refills | Status: DC

## 2020-07-25 NOTE — Progress Notes (Signed)
Patient ID: Sheila Davis, female   DOB: 12-Aug-1981, 39 y.o.   MRN: [id number]  Chief Complaint  Patient presents with  . Breast Problem    HPI Sheila Davis is a 39 y.o. female.  Breast lump and tenderness in left breast.  Also has vaginal discharge and itching. HPI  Past Medical History:  Diagnosis Date  . Anxiety   . Depression    pcp  . History of stomach ulcers   . Migraines   . Seasonal allergies     Past Surgical History:  Procedure Laterality Date  . CESAREAN SECTION      2002 & 2006  . CESAREAN SECTION WITH BILATERAL TUBAL LIGATION Bilateral 10/23/2019   Procedure: CESAREAN SECTION WITH BILATERAL TUBAL LIGATION;  Surgeon: Woodroe Mode, MD;  Location: [facility];  Service: Obstetrics;  Laterality: Bilateral;    Family History  Problem Relation Age of Onset  . Cancer - Ovarian Mother 63  . Heart disease Mother   . Cancer Mother   . Hypertension Father   . Diabetes Paternal Grandmother     Social History Social History   Tobacco Use  . Smoking status: Former Smoker    Packs/day: 0.20    Years: 0.50    Pack years: 0.10    Types: Cigarettes    Quit date: 09/02/2015    Years since quitting: 4.8  . Smokeless tobacco: Never Used  Vaping Use  . Vaping Use: Never used  Substance Use Topics  . Alcohol use: Not Currently    Alcohol/week: 0.0 standard drinks    Comment: occasionally  . Drug use: No    Allergies  Allergen Reactions  . Penicillins Rash    Has patient had a PCN reaction causing immediate rash, facial/tongue/throat swelling, SOB or lightheadedness with hypotension: no Has patient had a PCN reaction causing severe rash involving mucus membranes or skin necrosis: white puss Has patient had a PCN reaction that required hospitalization no Has patient had a PCN reaction occurring within the last 10 years: yes If all of the above answers are "NO", then may proceed with Cephalosporin use.     Current Outpatient Medications  Medication Sig  Dispense Refill  . citalopram (CELEXA) 20 MG tablet Take 1 tablet (20 mg total) by mouth daily. 30 tablet 2  . acetaminophen (TYLENOL) 325 MG tablet Take 2 tablets (650 mg total) by mouth every 4 (four) hours as needed. 100 tablet 2  . ALPRAZolam (XANAX) 0.5 MG tablet Take 1 tablet (0.5 mg total) by mouth 2 (two) times daily as needed for anxiety. 60 tablet 2  . atenolol-chlorthalidone (TENORETIC) 50-25 MG tablet Take 1 tablet by mouth daily.    . Blood Pressure Monitoring (BLOOD PRESSURE CUFF) MISC 1 Device by Does not apply route once a week. 1 each 0  . Blood Pressure Monitoring (BLOOD PRESSURE KIT) DEVI 1 kit by Does not apply route as needed. 1 kit 0  . clindamycin (CLEOCIN-T) 1 % lotion Apply topically 2 (two) times daily. 60 mL 4  . clotrimazole (LOTRIMIN) 1 % cream Apply 1 application topically 2 (two) times daily. 60 g 1  . fluconazole (DIFLUCAN) 150 MG tablet Take 1 tablet (150 mg total) by mouth once for 1 dose. 1 tablet 1  . metoCLOPramide (REGLAN) 10 MG tablet Take 10 mg by mouth 4 (four) times daily.    . ondansetron (ZOFRAN) 4 MG tablet Take 1 tablet (4 mg total) by mouth every 8 (eight) hours as needed for nausea  or vomiting. 20 tablet 0  . pantoprazole (PROTONIX) 20 MG tablet Take 20 mg by mouth daily.    . Prenat w/o A-FeCbn-Meth-FA-DHA (PRENATE MINI) 29-0.6-0.4-350 MG CAPS Take 1 capsule by mouth daily before breakfast. 90 capsule 3  . tinidazole (TINDAMAX) 500 MG tablet Take 2 tablets (1,000 mg total) by mouth daily with breakfast. 10 tablet 2   No current facility-administered medications for this visit.    Review of Systems Review of Systems Constitutional: negative for fatigue and weight loss Respiratory: negative for cough and wheezing Cardiovascular: negative for chest pain, fatigue and palpitations Gastrointestinal: negative for abdominal pain and change in bowel habits Genitourinary: positive for vaginal discharge and irritation Integument/breast: positive for  breast lump and tenderness in left breast Musculoskeletal:negative for myalgias Neurological: negative for gait problems and tremors Behavioral/Psych: negative for abusive relationship, depression Endocrine: negative for temperature intolerance      Blood pressure 124/77, pulse 85, weight 245 lb (111.1 kg), not currently breastfeeding.  Physical Exam Physical Exam General:   alert and no distress  Skin:   no rash or abnormalities  Lungs:   clear to auscultation bilaterally  Heart:   regular rate and rhythm, S1, S2 normal, no murmur, click, rub or gallop  Breasts:   Left: tenderness at 3 o'clock.  No masses felt  Abdomen:  normal findings: no organomegaly, soft, non-tender and no hernia  Pelvis:  External genitalia: normal general appearance Urinary system: urethral meatus normal and bladder without fullness, nontender Vaginal: normal without tenderness, induration or masses Cervix: normal appearance Adnexa: normal bimanual exam Uterus: anteverted and non-tender, normal size    50% of 15 min visit spent on counseling and coordination of care.   Data Reviewed Labs Wet Prep  Assessment     1. Left breast mass Rx: - MM Digital Diagnostic Unilat L; Future - MM DIAG BREAST TOMO BILATERAL; Future  2. Breast tenderness in female Rx: - MM Digital Diagnostic Unilat L; Future - MM DIAG BREAST TOMO BILATERAL; Future  3. Vaginal discharge Rx: - Cervicovaginal ancillary only( [place])  4. Vaginal irritation Rx: - clotrimazole (LOTRIMIN) 1 % cream; Apply 1 application topically 2 (two) times daily.  Dispense: 60 g; Refill: 1 - fluconazole (DIFLUCAN) 150 MG tablet; Take 1 tablet (150 mg total) by mouth once for 1 dose.  Dispense: 1 tablet; Refill: 1  5. Class 3 severe obesity due to excess calories without serious comorbidity with body mass index (BMI) of 40.0 to 44.9 in adult [facility]) - program of caloric reduction, exercise and behavioral modification recommended    Plan    Follow up in 2 weeks  Orders Placed This Encounter  Procedures  . MM Digital Diagnostic Unilat L    Standing Status:   Future    Standing Expiration Date:   07/25/2021    Order Specific Question:   Reason for Exam (SYMPTOM  OR DIAGNOSIS REQUIRED)    Answer:   Left breast mass and tenderness.    Order Specific Question:   Is the patient pregnant?    Answer:   No    Order Specific Question:   Preferred imaging location?    Answer:   [facility]  . MM DIAG BREAST TOMO BILATERAL    Standing Status:   Future    Standing Expiration Date:   07/25/2021    Order Specific Question:   Reason for Exam (SYMPTOM  OR DIAGNOSIS REQUIRED)    Answer:   Left breast mass and tenderness at 3  o'clock    Order Specific Question:   Is the patient pregnant?    Answer:   No    Order Specific Question:   Preferred imaging location?    Answer:   [facility]   Meds ordered this encounter  Medications  . clotrimazole (LOTRIMIN) 1 % cream    Sig: Apply 1 application topically 2 (two) times daily.    Dispense:  60 g    Refill:  1  . fluconazole (DIFLUCAN) 150 MG tablet    Sig: Take 1 tablet (150 mg total) by mouth once for 1 dose.    Dispense:  1 tablet    Refill:  1     Shelly Bombard, MD 07/25/2020 2:42 PM

## 2020-07-25 NOTE — Progress Notes (Signed)
Pt has small knot and pain on left breast.  Pt states pain off and on x 4 months.   Pt is also having some vaginal irritation.

## 2020-07-26 LAB — CERVICOVAGINAL ANCILLARY ONLY
Bacterial Vaginitis (gardnerella): NEGATIVE
Candida Glabrata: NEGATIVE
Candida Vaginitis: POSITIVE — AB
Chlamydia: NEGATIVE
Comment: NEGATIVE
Comment: NEGATIVE
Comment: NEGATIVE
Comment: NEGATIVE
Comment: NEGATIVE
Comment: NORMAL
Neisseria Gonorrhea: NEGATIVE
Trichomonas: NEGATIVE

## 2020-07-27 ENCOUNTER — Other Ambulatory Visit: Payer: Self-pay | Admitting: Obstetrics

## 2020-07-27 DIAGNOSIS — B3731 Acute candidiasis of vulva and vagina: Secondary | ICD-10-CM

## 2020-07-27 DIAGNOSIS — B373 Candidiasis of vulva and vagina: Secondary | ICD-10-CM

## 2020-07-27 MED ORDER — FLUCONAZOLE 150 MG PO TABS: 150.0000 mg | ORAL_TABLET | Freq: Once | ORAL | 0 refills | Status: AC

## 2020-08-16 ENCOUNTER — Ambulatory Visit
Admission: RE | Admit: 2020-08-16 | Discharge: 2020-08-16 | Disposition: A | Discharge status: Home / Self Care | Payer: BC Managed Care – PPO | Source: Ambulatory Visit | Attending: Obstetrics | Admitting: Obstetrics

## 2020-08-16 ENCOUNTER — Other Ambulatory Visit: Payer: Self-pay

## 2020-08-16 DIAGNOSIS — N6320 Unspecified lump in the left breast, unspecified quadrant: Secondary | ICD-10-CM

## 2020-08-16 DIAGNOSIS — N644 Mastodynia: Secondary | ICD-10-CM

## 2020-08-22 ENCOUNTER — Telehealth (INDEPENDENT_AMBULATORY_CARE_PROVIDER_SITE_OTHER): Payer: BC Managed Care – PPO | Admitting: Obstetrics

## 2020-08-22 ENCOUNTER — Encounter: Payer: Self-pay | Admitting: Obstetrics

## 2020-08-22 ENCOUNTER — Other Ambulatory Visit: Payer: Self-pay

## 2020-08-22 DIAGNOSIS — N644 Mastodynia: Secondary | ICD-10-CM

## 2020-08-22 DIAGNOSIS — N6320 Unspecified lump in the left breast, unspecified quadrant: Secondary | ICD-10-CM

## 2020-08-22 NOTE — Progress Notes (Signed)
GYNECOLOGY VIRTUAL VISIT ENCOUNTER NOTE  Provider location: Center for [facility]   I connected with Sheila Davis on 08/22/20 at  1:30 PM EST by MyChart Video Encounter at home and verified that I am speaking with the correct person using two identifiers.   I discussed the limitations, risks, security and privacy concerns of performing an evaluation and management service virtually and the availability of in person appointments. I also discussed with the patient that there may be a patient responsible charge related to this service. The patient expressed understanding and agreed to proceed.   History:  Sheila Davis is a 39 y.o. [phone] female being evaluated today for follow up after breast tomogram for breast pain.  The symptoms have resolved, and she thinks that the pain may have been related to either caffeine intake or poor fitting bra.  She denies any abnormal vaginal discharge, bleeding, pelvic pain or other concerns.       Past Medical History:  Diagnosis Date  . Anxiety   . Depression    pcp  . History of stomach ulcers   . Migraines   . Seasonal allergies    Past Surgical History:  Procedure Laterality Date  . CESAREAN SECTION      2002 & 2006  . CESAREAN SECTION WITH BILATERAL TUBAL LIGATION Bilateral 10/23/2019   Procedure: CESAREAN SECTION WITH BILATERAL TUBAL LIGATION;  Surgeon: Adam Phenix, MD;  Location: [facility];  Service: Obstetrics;  Laterality: Bilateral;   The following portions of the patient's history were reviewed and updated as appropriate: allergies, current medications, past family history, past medical history, past social history, past surgical history and problem list.   Health Maintenance:  Normal pap and negative HRHPV on 02-27-2020.  Normal mammogram on 08-16-2020.   Review of Systems:  Pertinent items noted in HPI and remainder of comprehensive ROS otherwise negative.  Physical Exam:   General:  Alert, oriented and  cooperative. Patient appears to be in no acute distress.  Mental Status: Normal mood and affect. Normal behavior. Normal judgment and thought content.   Respiratory: Normal respiratory effort, no problems with respiration noted  Rest of physical exam deferred due to type of encounter  Labs and Imaging No results found for this or any previous visit (from the past 336 hour(s)). US BREAST LTD UNI LEFT INC AXILLA  Result Date: 08/16/2020 CLINICAL DATA:  Left breast mass and tenderness. EXAM: DIGITAL DIAGNOSTIC BILATERAL MAMMOGRAM WITH CAD AND TOMO ULTRASOUND LEFT BREAST COMPARISON:  None. ACR Breast Density Category b: There are scattered areas of fibroglandular density. FINDINGS: No suspicious masses, calcifications, or distortion are identified in either breast. Mammographic images were processed with CAD. On physical exam, no suspicious lumps are identified. Targeted ultrasound is performed, showing no suspicious findings in the region of the patient's palpable lump. IMPRESSION: No mammographic or sonographic evidence of malignancy. RECOMMENDATION: Treatment of the patient's left breast lump should be based on clinical and physical exam given lack of imaging findings. Recommend annual screening mammography. I have discussed the findings and recommendations with the patient. If applicable, a reminder letter will be sent to the patient regarding the next appointment. BI-RADS CATEGORY  1: Negative. Electronically Signed   By: Gerome Sam III M.D   On: 08/16/2020 14:41   MM DIAG BREAST TOMO BILATERAL  Result Date: 08/16/2020 CLINICAL DATA:  Left breast mass and tenderness. EXAM: DIGITAL DIAGNOSTIC BILATERAL MAMMOGRAM WITH CAD AND TOMO ULTRASOUND LEFT BREAST COMPARISON:  None. ACR  Breast Density Category b: There are scattered areas of fibroglandular density. FINDINGS: No suspicious masses, calcifications, or distortion are identified in either breast. Mammographic images were processed with CAD. On  physical exam, no suspicious lumps are identified. Targeted ultrasound is performed, showing no suspicious findings in the region of the patient's palpable lump. IMPRESSION: No mammographic or sonographic evidence of malignancy. RECOMMENDATION: Treatment of the patient's left breast lump should be based on clinical and physical exam given lack of imaging findings. Recommend annual screening mammography. I have discussed the findings and recommendations with the patient. If applicable, a reminder letter will be sent to the patient regarding the next appointment. BI-RADS CATEGORY  1: Negative. Electronically Signed   By: Gerome Sam III M.D   On: 08/16/2020 14:41       Assessment and Plan:     1. Breast tenderness in female, resolved - repeat mammography in 1 year   2. Left breast mass - normal mammogram     I discussed the assessment and treatment plan with the patient. The patient was provided an opportunity to ask questions and all were answered. The patient agreed with the plan and demonstrated an understanding of the instructions.   The patient was advised to call back or seek an in-person evaluation/go to the ED if the symptoms worsen or if the condition fails to improve as anticipated.  I provided 15 minutes of face-to-face time during this encounter.   Coral Ceo, MD Center for [facility], [facility] 08/22/20

## 2020-08-22 NOTE — Progress Notes (Signed)
I connected with Sheila Davis on 08/22/20 at  1:30 PM EST by telephone and verified that I am speaking with the correct person using two identifiers.  Pt f/u after breast u/s for breast pain and mass

## 2020-09-11 ENCOUNTER — Encounter: Payer: Self-pay | Admitting: General Practice

## 2021-04-28 ENCOUNTER — Encounter: Payer: Self-pay | Admitting: Emergency Medicine

## 2021-04-28 ENCOUNTER — Ambulatory Visit
Admission: EM | Admit: 2021-04-28 | Discharge: 2021-04-28 | Disposition: A | Discharge status: Home / Self Care | Payer: Medicaid Other | Attending: Urgent Care | Admitting: Urgent Care

## 2021-04-28 ENCOUNTER — Other Ambulatory Visit: Payer: Self-pay

## 2021-04-28 DIAGNOSIS — N3001 Acute cystitis with hematuria: Secondary | ICD-10-CM | POA: Diagnosis not present

## 2021-04-28 LAB — POCT URINALYSIS DIP (MANUAL ENTRY)
Bilirubin, UA: NEGATIVE
Glucose, UA: NEGATIVE mg/dL
Leukocytes, UA: NEGATIVE
Nitrite, UA: POSITIVE — AB
Protein Ur, POC: NEGATIVE mg/dL
Spec Grav, UA: 1.015 (ref 1.010–1.025)
Urobilinogen, UA: 0.2 E.U./dL
pH, UA: 6.5 (ref 5.0–8.0)

## 2021-04-28 LAB — POCT URINE PREGNANCY: Preg Test, Ur: NEGATIVE

## 2021-04-28 MED ORDER — SULFAMETHOXAZOLE-TRIMETHOPRIM 800-160 MG PO TABS: 1.0000 | ORAL_TABLET | Freq: Two times a day (BID) | ORAL | 0 refills | Status: DC

## 2021-04-28 NOTE — ED Provider Notes (Signed)
Sheila Davis   MRN: [id number] DOB: November 04, 1980  Subjective:   Sheila Davis is a 40 y.o. female presenting for 2-day history of acute onset recurrent dysuria, low back pain.  Patient has been using Azo with minimal relief.  She actually finished a course of Cipro about 1 to 2 weeks ago.  Had similar symptoms but resolved.  Denies fever, nausea, vomiting, abdominal pain.  No current facility-administered medications for this encounter.  Current Outpatient Medications:    ALPRAZolam (XANAX) 0.5 MG tablet, Take 1 tablet (0.5 mg total) by mouth 2 (two) times daily as needed for anxiety., Disp: 60 tablet, Rfl: 2   atenolol-chlorthalidone (TENORETIC) 50-25 MG tablet, Take 1 tablet by mouth daily., Disp: , Rfl:    Blood Pressure Monitoring (BLOOD PRESSURE CUFF) MISC, 1 Device by Does not apply route once a week. (Patient not taking: Reported on 08/22/2020), Disp: 1 each, Rfl: 0   Blood Pressure Monitoring (BLOOD PRESSURE KIT) DEVI, 1 kit by Does not apply route as needed. (Patient not taking: Reported on 08/22/2020), Disp: 1 kit, Rfl: 0   citalopram (CELEXA) 20 MG tablet, Take 1 tablet (20 mg total) by mouth daily., Disp: 30 tablet, Rfl: 2   clindamycin (CLEOCIN-T) 1 % lotion, Apply topically 2 (two) times daily. (Patient not taking: Reported on 08/22/2020), Disp: 60 mL, Rfl: 4   clotrimazole (LOTRIMIN) 1 % cream, Apply 1 application topically 2 (two) times daily., Disp: 60 g, Rfl: 1   metoCLOPramide (REGLAN) 10 MG tablet, Take 10 mg by mouth 4 (four) times daily. (Patient not taking: Reported on 08/22/2020), Disp: , Rfl:    ondansetron (ZOFRAN) 4 MG tablet, Take 1 tablet (4 mg total) by mouth every 8 (eight) hours as needed for nausea or vomiting. (Patient not taking: Reported on 08/22/2020), Disp: 20 tablet, Rfl: 0   pantoprazole (PROTONIX) 20 MG tablet, Take 20 mg by mouth daily., Disp: , Rfl:    Prenat w/o A-FeCbn-Meth-FA-DHA (PRENATE MINI) 29-0.6-0.4-350 MG CAPS, Take 1 capsule by  mouth daily before breakfast., Disp: 90 capsule, Rfl: 3   tinidazole (TINDAMAX) 500 MG tablet, Take 2 tablets (1,000 mg total) by mouth daily with breakfast. (Patient not taking: Reported on 08/22/2020), Disp: 10 tablet, Rfl: 2   Allergies  Allergen Reactions   Penicillins Rash    Has patient had a PCN reaction causing immediate rash, facial/tongue/throat swelling, SOB or lightheadedness with hypotension: no Has patient had a PCN reaction causing severe rash involving mucus membranes or skin necrosis: white puss Has patient had a PCN reaction that required hospitalization no Has patient had a PCN reaction occurring within the last 10 years: yes If all of the above answers are "NO", then may proceed with Cephalosporin use.     Past Medical History:  Diagnosis Date   Anxiety    Depression    pcp   History of stomach ulcers    Migraines    Seasonal allergies      Past Surgical History:  Procedure Laterality Date   CESAREAN SECTION      2002 & 2006   CESAREAN SECTION WITH BILATERAL TUBAL LIGATION Bilateral 10/23/2019   Procedure: CESAREAN SECTION WITH BILATERAL TUBAL LIGATION;  Surgeon: Woodroe Mode, MD;  Location: [facility];  Service: Obstetrics;  Laterality: Bilateral;    Family History  Problem Relation Age of Onset   Cancer - Ovarian Mother 34   Heart disease Mother    Cancer Mother    Hypertension Father    Diabetes Paternal  Grandmother     Social History   Tobacco Use   Smoking status: Former    Packs/day: 0.20    Years: 0.50    Pack years: 0.10    Types: Cigarettes    Quit date: 09/02/2015    Years since quitting: 5.6   Smokeless tobacco: Never  Vaping Use   Vaping Use: Never used  Substance Use Topics   Alcohol use: Not Currently    Alcohol/week: 0.0 standard drinks    Comment: occasionally   Drug use: No    ROS   Objective:   Vitals: BP (!) 139/96 (BP Location: Left Arm)   Pulse 89   Temp 98 F (36.7 C) (Oral)   Resp 18   SpO2 95%    Physical Exam Constitutional:      General: She is not in acute distress.    Appearance: Normal appearance. She is well-developed. She is obese. She is not ill-appearing, toxic-appearing or diaphoretic.  HENT:     Head: Normocephalic and atraumatic.     Nose: Nose normal.     Mouth/Throat:     Mouth: Mucous membranes are moist.     Pharynx: Oropharynx is clear.  Eyes:     General: No scleral icterus.    Extraocular Movements: Extraocular movements intact.     Pupils: Pupils are equal, round, and reactive to light.  Cardiovascular:     Rate and Rhythm: Normal rate.  Pulmonary:     Effort: Pulmonary effort is normal.  Abdominal:     General: Bowel sounds are normal. There is no distension.     Palpations: Abdomen is soft. There is no mass.     Tenderness: There is no abdominal tenderness. There is no right CVA tenderness, left CVA tenderness, guarding or rebound.  Musculoskeletal:     Comments: Mild tenderness over the paraspinal muscles in the lumbar region.  Skin:    General: Skin is warm and dry.  Neurological:     General: No focal deficit present.     Mental Status: She is alert and oriented to person, place, and time.  Psychiatric:        Mood and Affect: Mood normal.        Behavior: Behavior normal.        Thought Content: Thought content normal.        Judgment: Judgment normal.    Results for orders placed or performed during the hospital encounter of 04/28/21 (from the past 24 hour(s))  POCT urinalysis dipstick     Status: Abnormal   Collection Time: 04/28/21 10:47 AM  Result Value Ref Range   Color, UA yellow yellow   Clarity, UA cloudy (A) clear   Glucose, UA negative negative mg/dL   Bilirubin, UA negative negative   Ketones, POC UA small (15) (A) negative mg/dL   Spec Grav, UA 1.015 1.010 - 1.025   Blood, UA small (A) negative   pH, UA 6.5 5.0 - 8.0   Protein Ur, POC negative negative mg/dL   Urobilinogen, UA 0.2 0.2 or 1.0 E.U./dL   Nitrite, UA  Positive (A) Negative   Leukocytes, UA Negative Negative  POCT urine pregnancy     Status: None   Collection Time: 04/28/21 10:47 AM  Result Value Ref Range   Preg Test, Ur Negative Negative    Assessment and Plan :   PDMP not reviewed this encounter.  1. Acute cystitis with hematuria     Start Bactrim to cover for acute cystitis,  urine culture pending.  Recommended aggressive hydration, limiting urinary irritants.  Low suspicion for pyelonephritis and therefore we will hold off on ceftriaxone injection especially with her history of allergy to penicillins.  Counseled patient on potential for adverse effects with medications prescribed/recommended today, ER and return-to-clinic precautions discussed, patient verbalized understanding.    Jaynee Eagles, PA-C 04/28/21 1119

## 2021-04-28 NOTE — ED Triage Notes (Signed)
Pt here for dysuria with pain in back and lower abd area x 2 days; pt sts took AZO without relief

## 2021-04-28 NOTE — Discharge Instructions (Addendum)

## 2021-04-29 LAB — URINE CULTURE: Culture: NO GROWTH

## 2021-06-03 ENCOUNTER — Ambulatory Visit (INDEPENDENT_AMBULATORY_CARE_PROVIDER_SITE_OTHER): Payer: Medicaid Other | Admitting: Advanced Practice Midwife

## 2021-06-03 ENCOUNTER — Other Ambulatory Visit: Payer: Self-pay

## 2021-06-03 ENCOUNTER — Encounter: Payer: Self-pay | Admitting: Advanced Practice Midwife

## 2021-06-03 ENCOUNTER — Other Ambulatory Visit (HOSPITAL_COMMUNITY)
Admission: RE | Admit: 2021-06-03 | Discharge: 2021-06-03 | Disposition: A | Discharge status: Home / Self Care | Payer: Medicaid Other | Source: Ambulatory Visit | Attending: Advanced Practice Midwife | Admitting: Advanced Practice Midwife

## 2021-06-03 VITALS — BP 135/89 | HR 80 | Ht 65.0 in | Wt 276.0 lb

## 2021-06-03 DIAGNOSIS — Z9851 Tubal ligation status: Secondary | ICD-10-CM | POA: Insufficient documentation

## 2021-06-03 DIAGNOSIS — F3281 Premenstrual dysphoric disorder: Secondary | ICD-10-CM

## 2021-06-03 DIAGNOSIS — R300 Dysuria: Secondary | ICD-10-CM

## 2021-06-03 DIAGNOSIS — R102 Pelvic and perineal pain: Secondary | ICD-10-CM | POA: Insufficient documentation

## 2021-06-03 DIAGNOSIS — F53 Postpartum depression: Secondary | ICD-10-CM

## 2021-06-03 DIAGNOSIS — O99345 Other mental disorders complicating the puerperium: Secondary | ICD-10-CM

## 2021-06-03 MED ORDER — CITALOPRAM HYDROBROMIDE 20 MG PO TABS: 20.0000 mg | ORAL_TABLET | Freq: Every day | ORAL | 12 refills | Status: DC

## 2021-06-03 MED ORDER — PHENAZOPYRIDINE HCL 200 MG PO TABS: 200.0000 mg | ORAL_TABLET | Freq: Three times a day (TID) | ORAL | 1 refills | Status: DC | PRN

## 2021-06-03 NOTE — Progress Notes (Signed)
   GYNECOLOGY PROGRESS NOTE  History:  40 y.o. [id number] presents to [facility] office today for problem gyn visit. She reports dysuria, suprapubic pain with intercourse, urinary frequency and urgency x 2 weeks. She also reports symptoms of anxiety and depression that started within a few months after having her last baby in January 2021 and have persisted. She has a hx of depression, and of PMDD and her anxiety is affected by her menstrual cycle.  She saw her PCP and thyroid labs are normal.  She has BTL for contraception.  Her mother passed away related to  ovarian cancer and she worries about this.    The following portions of the patient's history were reviewed and updated as appropriate: allergies, current medications, past family history, past medical history, past social history, past surgical history and problem list. Last pap smear on 02/27/20 was normal, neg HRHPV.  Health Maintenance Due  Topic Date Due   COVID-19 Vaccine (1) Never done   INFLUENZA VACCINE  05/06/2021     Review of Systems:  Pertinent items are noted in HPI.   Objective:  Physical Exam Blood pressure 135/89, pulse 80, height 5\' 5"  (1.651 m), weight 276 lb (125.2 kg), last menstrual period 05/10/2021, not currently breastfeeding. VS reviewed, nursing note reviewed,  Constitutional: well developed, well nourished, no distress HEENT: normocephalic CV: normal rate Pulm/chest wall: normal effort Breast Exam: deferred Abdomen: soft Neuro: alert and oriented x 3 Skin: warm, dry Psych: affect normal Pelvic exam: Deferred  Assessment & Plan:  1. Dysuria --Started after 07/10/2021 tea but persists 2 weeks later --No other dietary triggers, no alcohol, juice, nonsmoker, limited caffeine  --Urine dip wnl, culture pending --Rx for pyridium for PRN use  - POCT urinalysis dipstick - Urine Culture - Cervicovaginal ancillary only( [place])  2. Acute pelvic pain, female --Family hx ovarian cancer and acute pelvic  symptoms  - Dorena Dew PELVIC COMPLETE WITH TRANSVAGINAL; Future - Cervicovaginal ancillary only( [place])  3. Postpartum depression --Depression started in immediate postpartum period and has persisted.   --Pt took Celexa with some success in the past, symptoms worsened before her period and a second drug, Paxil was added which she did not tolerate --Plan for pt to see integrated BH and start low dose Celexa --F/U with MD in 2 months --Discussed anxiety medications, pt has Xanax for occasional use from PCP, does not tolerate Buspar or Vistaril.  - citalopram (CELEXA) 20 MG tablet; Take 1 tablet (20 mg total) by mouth daily.  Dispense: 30 tablet; Refill: 12 - Ambulatory referral to Integrated Behavioral Health  4. PMDD (premenstrual dysphoric disorder) --Discussed options to treat including IUD to stop/reduce menses and antidepressants showing some benefit --F/U with MD   - citalopram (CELEXA) 20 MG tablet; Take 1 tablet (20 mg total) by mouth daily.  Dispense: 30 tablet; Refill: 12 - Ambulatory referral to Integrated Behavioral Health   [place], CNM 5:59 PM

## 2021-06-03 NOTE — Progress Notes (Signed)
Pt presents today for evaluation of pelvic pain. Pain is located near bladder. States she is having some urgency with urination and inability to hold urine. Pt states she has been having the pain for approximately 2 weeks.

## 2021-06-04 LAB — POCT URINALYSIS DIPSTICK
Bilirubin, UA: NEGATIVE
Glucose, UA: NEGATIVE
Leukocytes, UA: NEGATIVE
Nitrite, UA: NEGATIVE
Protein, UA: POSITIVE — AB
Spec Grav, UA: 1.025 (ref 1.010–1.025)
Urobilinogen, UA: 0.2 E.U./dL
pH, UA: 6 (ref 5.0–8.0)

## 2021-06-05 LAB — CERVICOVAGINAL ANCILLARY ONLY
Chlamydia: NEGATIVE
Comment: NEGATIVE
Comment: NEGATIVE
Comment: NORMAL
Neisseria Gonorrhea: NEGATIVE
Trichomonas: NEGATIVE

## 2021-06-05 LAB — URINE CULTURE

## 2021-06-11 ENCOUNTER — Ambulatory Visit (INDEPENDENT_AMBULATORY_CARE_PROVIDER_SITE_OTHER): Payer: Medicaid Other | Admitting: Licensed Clinical Social Worker

## 2021-06-11 DIAGNOSIS — F53 Postpartum depression: Secondary | ICD-10-CM | POA: Diagnosis not present

## 2021-06-11 DIAGNOSIS — O99345 Other mental disorders complicating the puerperium: Secondary | ICD-10-CM

## 2021-06-12 ENCOUNTER — Ambulatory Visit: Admission: RE | Admit: 2021-06-12 | Payer: Medicaid Other | Source: Ambulatory Visit

## 2021-06-12 NOTE — BH Specialist Note (Signed)
Integrated Behavioral Health via Telemedicine Visit  06/12/2021 Sheila Davis [id number]  Number of Integrated Behavioral Health visits: 1 Session Start time: 3:00pm Session End time: 3:22pm Total time: 22 mins via mychart video   Referring Provider: Courtney Paris  Patient/Family location: Home  [facility] Provider location: [facility]  All persons participating in visit: Pt A Roser and LCSW A. Felton Clinton  Types of Service: General Behavioral Integrated Care (BHI)  I connected with Sheila Davis and/or Sheila Davis's  via  Telephone or Engineer, civil (consulting)  (Video is Film/video editor) and verified that I am speaking with the correct person using two identifiers. Discussed confidentiality: Yes   I discussed the limitations of telemedicine and the availability of in person appointments.  Discussed there is a possibility of technology failure and discussed alternative modes of communication if that failure occurs.  I discussed that engaging in this telemedicine visit, they consent to the provision of behavioral healthcare and the services will be billed under their insurance.  Patient and/or legal guardian expressed understanding and consented to Telemedicine visit: Yes   Presenting Concerns: Patient and/or family reports the following symptoms/concerns: depressed mood, feelings of guilt, decrease motivation,  Duration of problem: 6 months Severity of problem: mild  Patient and/or Family's Strengths/Protective Factors: Concrete supports in place (healthy food, safe environments, etc.)  Goals Addressed: Patient will:  Reduce symptoms of: anxiety and depression   Increase knowledge and/or ability of: coping skills and stress reduction   Demonstrate ability to: Increase adequate support systems for patient/family  Progress towards Goals: Ongoing  Interventions: Interventions utilized:  Supportive Counseling Standardized Assessments completed: Not Needed  Patient  and/or Family Response: Sheila Davis reports crying and feeling of guilt associated with failing her Nursing exam. Sheila Davis is overwhelm with professional and personal responsibilities.   Assessment: Patient currently experiencing adjustment disorder with mixed episodes .   Patient may benefit from integrated behavioral health .  Plan: Follow up with behavioral health clinician on : 3 weeks mychart  Behavioral recommendations: engage in self care, schedule in time for mediation and studying, communicate needs and take prescribe medication Referral(s): Integrated [employer] (In Clinic)  I discussed the assessment and treatment plan with the patient and/or parent/guardian. They were provided an opportunity to ask questions and all were answered. They agreed with the plan and demonstrated an understanding of the instructions.   They were advised to call back or seek an in-person evaluation if the symptoms worsen or if the condition fails to improve as anticipated.  Gwyndolyn Saxon, LCSW

## 2021-06-27 ENCOUNTER — Ambulatory Visit: Admission: RE | Admit: 2021-06-27 | Payer: Medicaid Other | Source: Ambulatory Visit

## 2021-07-02 ENCOUNTER — Encounter: Payer: Medicaid Other | Admitting: Licensed Clinical Social Worker

## 2021-08-01 ENCOUNTER — Ambulatory Visit: Payer: Medicaid Other | Admitting: Obstetrics and Gynecology

## 2021-10-10 ENCOUNTER — Other Ambulatory Visit: Payer: Self-pay | Admitting: Advanced Practice Midwife

## 2021-10-10 DIAGNOSIS — R300 Dysuria: Secondary | ICD-10-CM

## 2021-10-10 MED ORDER — PHENAZOPYRIDINE HCL 200 MG PO TABS: 200.0000 mg | ORAL_TABLET | Freq: Three times a day (TID) | ORAL | 0 refills | Status: DC | PRN

## 2021-10-11 ENCOUNTER — Encounter: Payer: Self-pay | Admitting: Internal Medicine

## 2021-10-30 ENCOUNTER — Ambulatory Visit: Payer: Medicaid Other | Admitting: Internal Medicine

## 2021-11-06 ENCOUNTER — Ambulatory Visit: Payer: Medicaid Other | Admitting: Obstetrics

## 2021-11-06 ENCOUNTER — Other Ambulatory Visit: Payer: Self-pay | Admitting: Obstetrics

## 2021-11-06 ENCOUNTER — Other Ambulatory Visit: Payer: Self-pay

## 2021-11-06 ENCOUNTER — Encounter: Payer: Self-pay | Admitting: Obstetrics

## 2021-11-06 VITALS — Ht 66.0 in | Wt 281.2 lb

## 2021-11-06 DIAGNOSIS — N644 Mastodynia: Secondary | ICD-10-CM | POA: Diagnosis not present

## 2021-11-06 DIAGNOSIS — Z6841 Body Mass Index (BMI) 40.0 and over, adult: Secondary | ICD-10-CM

## 2021-11-06 NOTE — Progress Notes (Signed)
Pt c/o sharp, achy pain in left breat. Feels pain in both breast at times. Pain score 10/10  Pain has been present for 4 days . No discharge  Last mammogram 08-16-20 LMP:10-27-21

## 2021-11-06 NOTE — Progress Notes (Signed)
Subjective:        Sheila Davis is a 41 y.o. female here for a routine exam.  Current complaints: Breast pain.  No leakage or masses felt.  Personal health questionnaire:  Is patient [name], have a family history of breast and/or ovarian cancer: yes Is there a family history of uterine cancer diagnosed at age < 62, gastrointestinal cancer, urinary tract cancer, family member who is a Field seismologist syndrome-associated carrier: no Is the patient overweight and hypertensive, family history of diabetes, personal history of gestational diabetes, preeclampsia or PCOS: yes Is patient over 69, have PCOS,  family history of premature CHD under age 80, diabetes, smoke, have hypertension or peripheral artery disease:  no At any time, has a partner hit, kicked or otherwise hurt or frightened you?: no Over the past 2 weeks, have you felt down, depressed or hopeless?: no Over the past 2 weeks, have you felt little interest or pleasure in doing things?:no   Gynecologic History No LMP recorded. Contraception: tubal ligation Last Pap: 02-27-2020. Results were: normal Last mammogram: 08-16-2020. Results were: normal  Obstetric History OB History  Gravida Para Term Preterm AB Living  5 3 3   2 3   SAB IAB Ectopic Multiple Live Births  2 0   0 3    # Outcome Date GA Lbr Len/2nd Weight Sex Delivery Anes PTL Lv  5 Term 10/23/19 [redacted]w[redacted]d  6 lb 5 oz (2.863 kg) M CS-Vac Spinal  LIV  4 SAB 2017          3 SAB 06/03/13 [redacted]w[redacted]d         2 Term 10/02/05 [redacted]w[redacted]d  7 lb (3.175 kg)  CS-Classical     1 Term 09/01/01 [redacted]w[redacted]d 24:00 8 lb 12 oz (3.969 kg) M CS-Classical        Complications: Polyhydramnios    Past Medical History:  Diagnosis Date   Anxiety    Depression    pcp   GERD (gastroesophageal reflux disease)    History of stomach ulcers    Hypertension    Migraines    Obesity (BMI 30-39.9)    Seasonal allergies     Past Surgical History:  Procedure Laterality Date   CESAREAN SECTION      2002 &  2006   CESAREAN SECTION WITH BILATERAL TUBAL LIGATION Bilateral 10/23/2019   Procedure: CESAREAN SECTION WITH BILATERAL TUBAL LIGATION;  Surgeon: Woodroe Mode, MD;  Location: [facility] LD ORS;  Service: Obstetrics;  Laterality: Bilateral;     Current Outpatient Medications:    ALPRAZolam (XANAX) 0.5 MG tablet, Take 1 tablet (0.5 mg total) by mouth 2 (two) times daily as needed for anxiety., Disp: 60 tablet, Rfl: 2   citalopram (CELEXA) 20 MG tablet, Take 1 tablet (20 mg total) by mouth daily. (Patient not taking: Reported on 11/06/2021), Disp: 30 tablet, Rfl: 12   phenazopyridine (PYRIDIUM) 200 MG tablet, Take 1 tablet (200 mg total) by mouth 3 (three) times daily as needed for pain (urethral spasm). (Patient not taking: Reported on 11/06/2021), Disp: 10 tablet, Rfl: 0 Allergies  Allergen Reactions   Penicillins Rash    Has patient had a PCN reaction causing immediate rash, facial/tongue/throat swelling, SOB or lightheadedness with hypotension: no Has patient had a PCN reaction causing severe rash involving mucus membranes or skin necrosis: white puss Has patient had a PCN reaction that required hospitalization no Has patient had a PCN reaction occurring within the last 10 years: yes If all of the above answers  are "NO", then may proceed with Cephalosporin use.     Social History   Tobacco Use   Smoking status: Former    Packs/day: 0.20    Years: 0.50    Pack years: 0.10    Types: Cigarettes    Quit date: 09/02/2015    Years since quitting: 6.1   Smokeless tobacco: Never  Substance Use Topics   Alcohol use: Not Currently    Alcohol/week: 0.0 standard drinks    Comment: occasionally    Family History  Problem Relation Age of Onset   Cancer - Ovarian Mother 24   Heart disease Mother    Cancer Mother    Hypertension Father    Diabetes Paternal Grandmother       Review of Systems  Constitutional: negative for fatigue and weight loss Respiratory: negative for cough and  wheezing Cardiovascular: negative for chest pain, fatigue and palpitations Gastrointestinal: negative for abdominal pain and change in bowel habits Musculoskeletal:negative for myalgias Neurological: negative for gait problems and tremors Behavioral/Psych: negative for abusive relationship, depression Endocrine: negative for temperature intolerance    Genitourinary:negative for abnormal menstrual periods, genital lesions, hot flashes, sexual problems and vaginal discharge Integument/breast: positive for breast pain, bilaterally.  negative for breast lump, nipple discharge and skin lesion(s)    Objective:       Ht 5\' 6"  (1.676 m)    Wt 281 lb 3.2 oz (127.6 kg)    BMI 45.39 kg/m  General:   Alert and no distress  Skin:   no rash or abnormalities  Lungs:   clear to auscultation bilaterally  Heart:   regular rate and rhythm, S1, S2 normal, no murmur, click, rub or gallop  Breasts:   normal without suspicious masses, skin or nipple changes or axillary nodes    Lab Review Urine pregnancy test Labs reviewed yes Radiologic studies reviewed yes  I have spent a total of 20 minutes of face-to-face time, excluding clinical staff time, reviewing notes and preparing to see patient, ordering tests and/or medications, and counseling the patient.   Assessment:    1. Breast pain Rx: - MM Digital Diagnostic Bilat; Future - MM DIAG BREAST TOMO BILATERAL; Future  2. Class 3 severe obesity without serious comorbidity with body mass index (BMI) of 40.0 to 44.9 in adult, unspecified obesity type ([place]) - weight loss with the aid of dietary changes, exercise and behavioral modification recommended    Plan:    Education reviewed: calcium supplements, depression evaluation, low fat, low cholesterol diet, safe sex/STD prevention, self breast exams, and weight bearing exercise. Mammogram ordered. Follow up in: 2 weeks.     Shelly Bombard, MD 11/06/2021 10:19 AM

## 2021-11-14 ENCOUNTER — Ambulatory Visit: Payer: Medicaid Other

## 2021-11-20 ENCOUNTER — Ambulatory Visit
Admission: RE | Admit: 2021-11-20 | Discharge: 2021-11-20 | Disposition: A | Discharge status: Home / Self Care | Payer: Medicaid Other | Source: Ambulatory Visit | Attending: Obstetrics | Admitting: Obstetrics

## 2021-11-20 ENCOUNTER — Ambulatory Visit: Payer: Medicaid Other

## 2021-11-20 ENCOUNTER — Telehealth: Payer: Medicaid Other | Admitting: Obstetrics

## 2021-11-20 DIAGNOSIS — N644 Mastodynia: Secondary | ICD-10-CM

## 2021-12-03 ENCOUNTER — Ambulatory Visit: Payer: Medicaid Other | Admitting: Obstetrics

## 2022-02-20 ENCOUNTER — Other Ambulatory Visit (HOSPITAL_COMMUNITY)
Admission: RE | Admit: 2022-02-20 | Discharge: 2022-02-20 | Disposition: A | Discharge status: Home / Self Care | Payer: Medicaid Other | Source: Ambulatory Visit | Attending: Obstetrics | Admitting: Obstetrics

## 2022-02-20 ENCOUNTER — Ambulatory Visit (INDEPENDENT_AMBULATORY_CARE_PROVIDER_SITE_OTHER): Payer: Medicaid Other | Admitting: Obstetrics

## 2022-02-20 ENCOUNTER — Encounter: Payer: Self-pay | Admitting: Obstetrics

## 2022-02-20 VITALS — BP 128/88 | HR 91 | Ht 65.0 in | Wt 284.5 lb

## 2022-02-20 DIAGNOSIS — R300 Dysuria: Secondary | ICD-10-CM | POA: Diagnosis not present

## 2022-02-20 DIAGNOSIS — N898 Other specified noninflammatory disorders of vagina: Secondary | ICD-10-CM | POA: Diagnosis present

## 2022-02-20 DIAGNOSIS — Z01419 Encounter for gynecological examination (general) (routine) without abnormal findings: Secondary | ICD-10-CM | POA: Insufficient documentation

## 2022-02-20 DIAGNOSIS — Z6841 Body Mass Index (BMI) 40.0 and over, adult: Secondary | ICD-10-CM

## 2022-02-20 NOTE — Progress Notes (Signed)
Patient presents for AEX. Patient complains of having some vaginal irritation, urinary frequency, and malodorous urine. Sx has been present for about a week. Desires STD testing and would like to be checked for yeast and bv. No other concerns.  Last pap: 02/27/20 Normal Last MM: Feb 2023 Normal

## 2022-02-20 NOTE — Progress Notes (Signed)
Subjective:        Sheila Davis is a 41 y.o. female here for a routine exam.  Current complaints: Vaginal discharge with irritation, and urinary frequency with burning and odor.    Personal health questionnaire:  Is patient [name], have a family history of breast and/or ovarian cancer: yes Is there a family history of uterine cancer diagnosed at age < 45, gastrointestinal cancer, urinary tract cancer, family member who is a Personnel officer syndrome-associated carrier: no Is the patient overweight and hypertensive, family history of diabetes, personal history of gestational diabetes, preeclampsia or PCOS: no Is patient over 17, have PCOS,  family history of premature CHD under age 90, diabetes, smoke, have hypertension or peripheral artery disease:  no At any time, has a partner hit, kicked or otherwise hurt or frightened you?: no Over the past 2 weeks, have you felt down, depressed or hopeless?: no Over the past 2 weeks, have you felt little interest or pleasure in doing things?:no   Gynecologic History Patient's last menstrual period was 01/27/2022. Contraception: tubal ligation Last Pap: 2021. Results were: normal Last mammogram: 2 / 2023. Results were: normal  Obstetric History OB History  Gravida Para Term Preterm AB Living  5 3 3   2 3   SAB IAB Ectopic Multiple Live Births  2 0   0 3    # Outcome Date GA Lbr Len/2nd Weight Sex Delivery Anes PTL Lv  5 Term 10/23/19 [redacted]w[redacted]d  6 lb 5 oz (2.863 kg) M CS-Vac Spinal  LIV  4 SAB 2017          3 SAB 06/03/13 [redacted]w[redacted]d         2 Term 10/02/05 [redacted]w[redacted]d  7 lb (3.175 kg)  CS-Classical     1 Term 09/01/01 [redacted]w[redacted]d 24:00 8 lb 12 oz (3.969 kg) M CS-Classical        Complications: Polyhydramnios    Past Medical History:  Diagnosis Date   Anxiety    Depression    pcp   GERD (gastroesophageal reflux disease)    History of stomach ulcers    Hypertension    Migraines    Obesity (BMI 30-39.9)    Seasonal allergies     Past Surgical History:   Procedure Laterality Date   CESAREAN SECTION      2002 & 2006   CESAREAN SECTION WITH BILATERAL TUBAL LIGATION Bilateral 10/23/2019   Procedure: CESAREAN SECTION WITH BILATERAL TUBAL LIGATION;  Surgeon: 10/25/2019, MD;  Location: [facility] LD [facility]S;  Service: Obstetrics;  Laterality: Bilateral;     Current Outpatient Medications:    ALPRAZolam (XANAX) 0.5 MG tablet, Take 1 tablet (0.5 mg total) by mouth 2 (two) times daily as needed for anxiety., Disp: 60 tablet, Rfl: 2   citalopram (CELEXA) 20 MG tablet, Take 1 tablet (20 mg total) by mouth daily., Disp: 30 tablet, Rfl: 12   pantoprazole (PROTONIX) 20 MG tablet, Take 20 mg by mouth daily., Disp: , Rfl:    phenazopyridine (PYRIDIUM) 200 MG tablet, Take 1 tablet (200 mg total) by mouth 3 (three) times daily as needed for pain (urethral spasm)., Disp: 10 tablet, Rfl: 0 Allergies  Allergen Reactions   Penicillins Rash    Has patient had a PCN reaction causing immediate rash, facial/tongue/throat swelling, SOB or lightheadedness with hypotension: no Has patient had a PCN reaction causing severe rash involving mucus membranes or skin necrosis: white puss Has patient had a PCN reaction that required hospitalization no Has patient had a  PCN reaction occurring within the last 10 years: yes If all of the above answers are "NO", then may proceed with Cephalosporin use.     Social History   Tobacco Use   Smoking status: Former    Packs/day: 0.20    Years: 0.50    Pack years: 0.10    Types: Cigarettes    Quit date: 09/02/2015    Years since quitting: 6.4   Smokeless tobacco: Never  Substance Use Topics   Alcohol use: Not Currently    Comment: rarely    Family History  Problem Relation Age of Onset   Cancer - Ovarian Mother 27   Heart disease Mother    Ovarian cancer Mother    Hypertension Father    Heart attack Maternal Grandmother    Lung cancer Maternal Grandfather    Diabetes Paternal Grandmother       Review of  Systems  Constitutional: negative for fatigue and weight loss Respiratory: negative for cough and wheezing Cardiovascular: negative for chest pain, fatigue and palpitations Gastrointestinal: negative for abdominal pain and change in bowel habits Musculoskeletal:negative for myalgias Neurological: negative for gait problems and tremors Behavioral/Psych: negative for abusive relationship, depression Endocrine: negative for temperature intolerance    Genitourinary: positive for vaginal discharge and urinary frequency with burning and odor.  negative for abnormal menstrual periods, genital lesions, hot flashes, sexual problems  Integument/breast: negative for breast lump, breast tenderness, nipple discharge and skin lesion(s)    Objective:       BP 128/88   Pulse 91   Ht 5\' 5"  (1.651 m)   Wt 284 lb 8 oz (129 kg)   LMP 01/27/2022   BMI 47.34 kg/m  General:   Alert and no distress  Skin:   no rash or abnormalities  Lungs:   clear to auscultation bilaterally  Heart:   regular rate and rhythm, S1, S2 normal, no murmur, click, rub or gallop  Breasts:   normal without suspicious masses, skin or nipple changes or axillary nodes  Abdomen:  normal findings: no organomegaly, soft, non-tender and no hernia  Pelvis:  External genitalia: normal general appearance Urinary system: urethral meatus normal and bladder without fullness, nontender Vaginal: normal without tenderness, induration or masses Cervix: normal appearance Adnexa: normal bimanual exam Uterus: anteverted and non-tender, normal size   Lab Review Urine pregnancy test Labs reviewed yes Radiologic studies reviewed yes  I have spent a total of 20 minutes of face-to-face time, excluding clinical staff time, reviewing notes and preparing to see patient, ordering tests and/or medications, and counseling the patient.   Assessment:    1. Encounter for gynecological examination with Papanicolaou smear of cervix Rx: - Cytology -  PAP( [place]) - POCT Urinalysis Dipstick  2. Vaginal discharge Rx: - Cervicovaginal ancillary only( [place])  3. Dysuria Rx: - Urine Culture  4. Class 3 severe obesity due to excess calories without serious comorbidity with body mass index (BMI) of 45.0 to 49.9 in adult (HCC) - weight reduction with the aid of dietary changes, exercise and behavioral modification recommended    Plan:    Education reviewed: calcium supplements, depression evaluation, low fat, low cholesterol diet, safe sex/STD prevention, self breast exams, and weight bearing exercise. Follow up in: 1 year.    Orders Placed This Encounter  Procedures   Urine Culture   POCT Urinalysis Dipstick      01/29/2022, MD 02/20/2022 1:42 PM

## 2022-02-21 LAB — CERVICOVAGINAL ANCILLARY ONLY
Bacterial Vaginitis (gardnerella): NEGATIVE
Candida Glabrata: NEGATIVE
Candida Vaginitis: NEGATIVE
Chlamydia: NEGATIVE
Comment: NEGATIVE
Comment: NEGATIVE
Comment: NEGATIVE
Comment: NEGATIVE
Comment: NEGATIVE
Comment: NORMAL
Neisseria Gonorrhea: NEGATIVE
Trichomonas: NEGATIVE

## 2022-02-21 LAB — POCT URINALYSIS DIPSTICK
Bilirubin, UA: NEGATIVE
Blood, UA: NEGATIVE
Glucose, UA: NEGATIVE
Ketones, UA: NEGATIVE
Leukocytes, UA: NEGATIVE
Nitrite, UA: NEGATIVE
Protein, UA: NEGATIVE
Spec Grav, UA: 1.015 (ref 1.010–1.025)
Urobilinogen, UA: 0.2 E.U./dL
pH, UA: 6.5 (ref 5.0–8.0)

## 2022-02-22 LAB — URINE CULTURE: Organism ID, Bacteria: NO GROWTH

## 2022-02-24 LAB — CYTOLOGY - PAP
Comment: NEGATIVE
Diagnosis: NEGATIVE
High risk HPV: NEGATIVE

## 2022-03-25 ENCOUNTER — Encounter: Payer: Self-pay | Admitting: Gastroenterology

## 2022-03-25 ENCOUNTER — Ambulatory Visit (INDEPENDENT_AMBULATORY_CARE_PROVIDER_SITE_OTHER): Payer: Medicaid Other | Admitting: Gastroenterology

## 2022-03-25 ENCOUNTER — Other Ambulatory Visit (INDEPENDENT_AMBULATORY_CARE_PROVIDER_SITE_OTHER): Payer: Medicaid Other

## 2022-03-25 VITALS — BP 132/86 | HR 94 | Ht 65.0 in | Wt 279.0 lb

## 2022-03-25 DIAGNOSIS — R140 Abdominal distension (gaseous): Secondary | ICD-10-CM

## 2022-03-25 DIAGNOSIS — R112 Nausea with vomiting, unspecified: Secondary | ICD-10-CM | POA: Diagnosis not present

## 2022-03-25 DIAGNOSIS — K625 Hemorrhage of anus and rectum: Secondary | ICD-10-CM

## 2022-03-25 DIAGNOSIS — K5909 Other constipation: Secondary | ICD-10-CM

## 2022-03-25 DIAGNOSIS — R1012 Left upper quadrant pain: Secondary | ICD-10-CM

## 2022-03-25 LAB — COMPREHENSIVE METABOLIC PANEL
ALT: 9 U/L (ref 0–35)
AST: 11 U/L (ref 0–37)
Albumin: 3.9 g/dL (ref 3.5–5.2)
Alkaline Phosphatase: 74 U/L (ref 39–117)
BUN: 7 mg/dL (ref 6–23)
CO2: 28 mEq/L (ref 19–32)
Calcium: 9 mg/dL (ref 8.4–10.5)
Chloride: 104 mEq/L (ref 96–112)
Creatinine, Ser: 0.78 mg/dL (ref 0.40–1.20)
GFR: 94.51 mL/min (ref >60.00)
Glucose, Bld: 97 mg/dL (ref 70–99)
Potassium: 3.9 mEq/L (ref 3.5–5.1)
Sodium: 138 mEq/L (ref 135–145)
Total Bilirubin: 0.4 mg/dL (ref 0.2–1.2)
Total Protein: 7.7 g/dL (ref 6.0–8.3)

## 2022-03-25 LAB — CBC WITH DIFFERENTIAL/PLATELET
Basophils Absolute: 0.1 10*3/uL (ref 0.0–0.1)
Basophils Relative: 1 % (ref 0.0–3.0)
Eosinophils Absolute: 0.1 10*3/uL (ref 0.0–0.7)
Eosinophils Relative: 1.4 % (ref 0.0–5.0)
HCT: 37.5 % (ref 36.0–46.0)
Hemoglobin: 11.9 g/dL — ABNORMAL LOW (ref 12.0–15.0)
Lymphocytes Relative: 20.3 % (ref 12.0–46.0)
Lymphs Abs: 1.5 10*3/uL (ref 0.7–4.0)
MCHC: 31.7 g/dL (ref 30.0–36.0)
MCV: 80.7 fl (ref 78.0–100.0)
Monocytes Absolute: 0.5 10*3/uL (ref 0.1–1.0)
Monocytes Relative: 7.3 % (ref 3.0–12.0)
Neutro Abs: 5 10*3/uL (ref 1.4–7.7)
Neutrophils Relative %: 70 % (ref 43.0–77.0)
Platelets: 392 10*3/uL (ref 150.0–400.0)
RBC: 4.65 Mil/uL (ref 3.87–5.11)
RDW: 16.3 % — ABNORMAL HIGH (ref 11.5–15.5)
WBC: 7.2 10*3/uL (ref 4.0–10.5)

## 2022-03-25 MED ORDER — NA SULFATE-K SULFATE-MG SULF 17.5-3.13-1.6 GM/177ML PO SOLN: 1.0000 | Freq: Once | ORAL | 0 refills | Status: AC

## 2022-03-25 MED ORDER — METOCLOPRAMIDE HCL 5 MG PO TABS: ORAL_TABLET | ORAL | 0 refills | Status: DC

## 2022-03-25 MED ORDER — LINACLOTIDE 145 MCG PO CAPS: 145.0000 ug | ORAL_CAPSULE | Freq: Every day | ORAL | 0 refills | Status: DC

## 2022-03-25 NOTE — Patient Instructions (Signed)
If you are age 41 or older, your body mass index should be between 23-30. Your Body mass index is 46.43 kg/m. If this is out of the aforementioned range listed, please consider follow up with your Primary Care Provider.  If you are age 65 or younger, your body mass index should be between 19-25. Your Body mass index is 46.43 kg/m. If this is out of the aformentioned range listed, please consider follow up with your Primary Care Provider.   ________________________________________________________  The [facility] providers would like to encourage you to use [facility] to communicate with providers for non-urgent requests or questions.  Due to long hold times on the telephone, sending your provider a message by [facility] may be a faster and more efficient way to get a response.  Please allow 48 business hours for a response.  Please remember that this is for non-urgent requests.  _______________________________________________________   Sheila Davis have been scheduled for an endoscopy and colonoscopy. Please follow the written instructions given to you at your visit today. Please pick up your prep supplies at the pharmacy within the next 1-3 days. If you use inhalers (even only as needed), please bring them with you on the day of your procedure.   Your provider has requested that you go to the basement level for lab work before leaving today. Press "B" on the elevator. The lab is located at the first door on the left as you exit the elevator.  You have been scheduled for a CT scan of the abdomen and pelvis at [facility], 1st floor Radiology. You are scheduled on [phone]  at 8:30am. You should arrive 15 minutes prior to your appointment time for registration.  Please pick up 2 bottles of contrast from [facility] at least 3 days prior to your scan. The solution may taste better if refrigerated, but do NOT add ice or any other liquid to this solution. Shake well before drinking.   Please follow the  written instructions below on the day of your exam:   1) Do not eat anything after 4:30am (4 hours prior to your test)   2) Drink 1 bottle of contrast @ 6:30am (2 hours prior to your exam)  Remember to shake well before drinking and do NOT pour over ice. Drink 1 bottle of contrast @ 7:30am (1 hour prior to your exam)   You may take any medications as prescribed with a small amount of water, if necessary. If you take any of the following medications: METFORMIN, GLUCOPHAGE, GLUCOVANCE, AVANDAMET, RIOMET, FORTAMET, [place] MET, JANUMET, GLUMETZA or METAGLIP, you MAY be asked to HOLD this medication 48 hours AFTER the exam.   The purpose of you drinking the oral contrast is to aid in the visualization of your intestinal tract. The contrast solution may cause some diarrhea. Depending on your individual set of symptoms, you may also receive an intravenous injection of x-ray contrast/dye. Plan on being at [facility] for 45 minutes or longer, depending on the type of exam you are having performed.   If you have any questions regarding your exam or if you need to reschedule, you may call [facility] at [phone] between the hours of 8:00 am and 5:00 pm, Monday-Friday.   Medication Samples have been provided to the patient.  Drug name: Linzess       Strength: 133mg        Qty: 4  LOT:: [id number] Exp.Date: 032024 Take 1 a day, with morning meal.  Use the Reglan tablets given today for the bowel prep. Take 1 tablet 30 minutes before each prep.   Due to recent changes in healthcare laws, you may see the results of your imaging and laboratory studies on MyChart before your provider has had a chance to review them.  We understand that in some cases there may be results that are confusing or concerning to you. Not all laboratory results come back in the same time frame and the provider may be waiting for multiple results in order to interpret others.  Please give us 48 hours in order for your  provider to thoroughly review all the results before contacting the office for clarification of your results.   

## 2022-03-25 NOTE — Progress Notes (Signed)
[facility] Consult Note:  History: Sheila Davis 03/25/2022  Referring provider: Raelyn Number, PA  Reason for consult/chief complaint: Rectal Bleeding, Gastroesophageal Reflux, Abdominal Pain (LLQ severe pain), Constipation (For about 2-3 week. Patient states she has been trying to resolve the constipation on her own. ), Hemorrhoids (Possibly an external), and Blood In Stools   Subjective  HPI:  Sheila Davis was referred by primary care after she contacted them recently with chronic abdominal pain and altered bowel habits.  She has not recently seen that office, and having what sounds of a routine visit with them about 6 months ago  She has had intermittent left-sided abdominal pain and tendencies to constipation with some intermittent bleeding attributed to hemorrhoids for years, but all symptoms are worse in the last several weeks.  She has a more constant and worsening left upper quadrant pain with associated bloating and regurgitation with nausea that is worse after some food triggers, most notably coffee.  She is now having a BM about 2-3 times a week without improvement after recent trial of MiraLAX.  She has some rectal bleeding when she strains for bowel movement.  Appetite decreasing, no appreciable weight loss. When the left upper quadrant pain is more severe she also has fatigue and diffuse muscle aches.  ROS:  Review of Systems  Constitutional:  Positive for fatigue. Negative for appetite change and unexpected weight change.  HENT:  Negative for mouth sores and voice change.   Eyes:  Negative for pain and redness.  Respiratory:  Negative for cough and shortness of breath.   Cardiovascular:  Negative for chest pain and palpitations.  Genitourinary:  Negative for dysuria and hematuria.  Musculoskeletal:  Negative for arthralgias and myalgias.  Skin:  Negative for pallor and rash.  Neurological:  Negative for weakness and headaches.  Hematological:   Negative for adenopathy.  Psychiatric/Behavioral:         Mood stable     Past Medical History: Past Medical History:  Diagnosis Date   Anxiety    Depression    pcp   GERD (gastroesophageal reflux disease)    History of stomach ulcers    Hypertension    Migraines    Obesity (BMI 30-39.9)    Seasonal allergies    Unclear reported history of upper digestive "ulcers".  She has not previously seen a GI physician  Past Surgical History: Past Surgical History:  Procedure Laterality Date   CESAREAN SECTION      2002 & 2006   CESAREAN SECTION WITH BILATERAL TUBAL LIGATION Bilateral 10/23/2019   Procedure: CESAREAN SECTION WITH BILATERAL TUBAL LIGATION;  Surgeon: Woodroe Mode, MD;  Location: [facility] LD ORS;  Service: Obstetrics;  Laterality: Bilateral;     Family History: Family History  Problem Relation Age of Onset   Cancer - Ovarian Mother 54   Heart disease Mother    Ovarian cancer Mother    Hypertension Father    COPD Father    Heart attack Maternal Grandmother    Lung cancer Maternal Grandfather    Diabetes Paternal Grandmother    Colon cancer Neg Hx    Stomach cancer Neg Hx    Esophageal cancer Neg Hx    Colon polyps Neg Hx     Social History: Social History   Socioeconomic History   Marital status: Single    Spouse name: Not on file   Davis of children: 3   Years of education: Not on file   Highest education level: Not on  file  Occupational History   Not on file  Tobacco Use   Smoking status: Former    Packs/day: 0.20    Years: 0.50    Total pack years: 0.10    Types: Cigarettes    Quit date: 09/02/2015    Years since quitting: 6.5   Smokeless tobacco: Never  Vaping Use   Vaping Use: Never used  Substance and Sexual Activity   Alcohol use: Not Currently    Comment: rarely   Drug use: No   Sexual activity: Yes    Partners: Male    Birth control/protection: Surgical  Other Topics Concern   Not on file  Social History Narrative   Not on file    Social Determinants of Health   Financial Resource Strain: Not on file  Food Insecurity: Not on file  Transportation Needs: Not on file  Physical Activity: Not on file  Stress: Not on file  Social Connections: Not on file    Allergies: Allergies  Allergen Reactions   Penicillins Rash    Has patient had a PCN reaction causing immediate rash, facial/tongue/throat swelling, SOB or lightheadedness with hypotension: no Has patient had a PCN reaction causing severe rash involving mucus membranes or skin necrosis: white puss Has patient had a PCN reaction that required hospitalization no Has patient had a PCN reaction occurring within the last 10 years: yes If all of the above answers are "NO", then may proceed with Cephalosporin use.     Outpatient Meds: Current Outpatient Medications  Medication Sig Dispense Refill   ALPRAZolam (XANAX) 0.5 MG tablet Take 1 tablet (0.5 mg total) by mouth 2 (two) times daily as needed for anxiety. 60 tablet 2   citalopram (CELEXA) 20 MG tablet Take 1 tablet (20 mg total) by mouth daily. 30 tablet 12   linaclotide (LINZESS) 145 MCG CAPS capsule Take 1 capsule (145 mcg total) by mouth daily before breakfast. 4 capsule 0   metoCLOPramide (REGLAN) 5 MG tablet Take 1 tablet 30 minutes before each bowel prep. 2 tablet 0   Na Sulfate-K Sulfate-Mg Sulf 17.5-3.13-1.6 GM/177ML SOLN Take 1 kit by mouth once for 1 dose. 354 mL 0   pantoprazole (PROTONIX) 20 MG tablet Take 20 mg by mouth daily.     Probiotic Product (PROBIOTIC PO) Take by mouth.     sucralfate (CARAFATE) 1 g tablet Take 1 g by mouth 4 (four) times daily.     No current facility-administered medications for this visit.      ___________________________________________________________________ Objective   Exam:  BP 132/86   Pulse 94   Ht _0  (1.651 m)   Wt 279 lb (126.6 kg)   SpO2 96%   BMI 46.43 kg/m  Wt Readings from Last 3 Encounters:  03/25/22 279 lb (126.6 kg)  02/20/22 284 lb  8 oz (129 kg)  11/06/21 281 lb 3.2 oz (127.6 kg)    General: Well-appearing Eyes: sclera anicteric, no redness ENT: oral mucosa moist without lesions, no cervical or supraclavicular lymphadenopathy CV: Regular without murmur, no JVD, no peripheral edema Resp: clear to auscultation bilaterally, normal RR and effort noted GI: soft, LUQ tenderness, with active bowel sounds. No guarding or palpable organomegaly noted.  Morbidly obese, protuberant anterior abdominal wall Skin; warm and dry, no rash or jaundice noted Neuro: awake, alert and oriented x 3. Normal gross motor function and fluent speech  Labs:  No data for review  Assessment: Encounter Diagnoses  Name Primary?   LUQ pain Yes  Abdominal bloating    Nausea and vomiting in adult    Chronic constipation    Rectal bleeding     Constellation of GI symptoms, all worse in recent weeks and most notably left upper quadrant pain with worsening constipation. Broad differential, though I am most concerned about a smoldering diverticulitis.  Plan:  CT abdomen and pelvis within the next week  EGD and colonoscopy.  She was agreeable after discussion of procedure and risks.  The benefits and risks of the planned procedure were described in detail with the patient or (when appropriate) their health care proxy.  Risks were outlined as including, but not limited to, bleeding, infection, perforation, adverse medication reaction leading to cardiac or pulmonary decompensation, pancreatitis (if ERCP).  The limitation of incomplete mucosal visualization was also discussed.  No guarantees or warranties were given. Patient at increased risk for cardiopulmonary complications of procedure due to medical comorbidities. (Procedures have been booked out several weeks both for availability but also to get the CT results first)  CBC and CMP today  Samples of Linzess 145 mcg once daily were given (1 week supply to hopefully relieve constipation  somewhat while testing underway to make diagnosis)  Thank you for the courtesy of this consult.  Please call me with any questions or concerns.  Nelida Meuse III  CC: Referring provider noted above

## 2022-03-31 ENCOUNTER — Ambulatory Visit (HOSPITAL_COMMUNITY)
Admission: RE | Admit: 2022-03-31 | Discharge: 2022-03-31 | Disposition: A | Discharge status: Home / Self Care | Payer: Medicaid Other | Source: Ambulatory Visit | Attending: Gastroenterology | Admitting: Gastroenterology

## 2022-03-31 DIAGNOSIS — R140 Abdominal distension (gaseous): Secondary | ICD-10-CM | POA: Insufficient documentation

## 2022-03-31 DIAGNOSIS — K625 Hemorrhage of anus and rectum: Secondary | ICD-10-CM | POA: Insufficient documentation

## 2022-03-31 DIAGNOSIS — R1012 Left upper quadrant pain: Secondary | ICD-10-CM | POA: Diagnosis present

## 2022-03-31 DIAGNOSIS — R112 Nausea with vomiting, unspecified: Secondary | ICD-10-CM | POA: Insufficient documentation

## 2022-03-31 DIAGNOSIS — K5909 Other constipation: Secondary | ICD-10-CM | POA: Diagnosis present

## 2022-03-31 MED ORDER — IOHEXOL 300 MG/ML  SOLN
100.0000 mL | Freq: Once | INTRAMUSCULAR | Status: AC | PRN
  Administered 2022-03-31: 100 mL via INTRAVENOUS

## 2022-03-31 MED ORDER — SODIUM CHLORIDE (PF) 0.9 % IJ SOLN
INTRAMUSCULAR | Status: AC
  Filled 2022-03-31: qty 50

## 2022-05-08 ENCOUNTER — Telehealth: Payer: Self-pay | Admitting: Gastroenterology

## 2022-05-08 NOTE — Telephone Encounter (Signed)
Good Morning Dr. Myrtie Neither,  Patient called stating that she needed to cancel her endoscopy and colonoscopy for tomorrow at 11:00 due to patient not doing her prep correctly and also wanting to talk to her insurance about the procedures.  Patient stated she will contact us to reschedule after she speaks with her insurance.

## 2022-05-09 ENCOUNTER — Encounter: Payer: Medicaid Other | Admitting: Gastroenterology

## 2022-06-15 ENCOUNTER — Ambulatory Visit
Admission: EM | Admit: 2022-06-15 | Discharge: 2022-06-15 | Discharge status: Against Medical Advice | Payer: Medicaid Other

## 2022-06-23 DIAGNOSIS — M25562 Pain in left knee: Secondary | ICD-10-CM | POA: Insufficient documentation

## 2022-08-31 ENCOUNTER — Other Ambulatory Visit: Payer: Self-pay

## 2022-08-31 ENCOUNTER — Ambulatory Visit
Admission: EM | Admit: 2022-08-31 | Discharge: 2022-08-31 | Disposition: A | Discharge status: Home / Self Care | Payer: Medicaid Other | Attending: Physician Assistant | Admitting: Physician Assistant

## 2022-08-31 ENCOUNTER — Encounter: Payer: Self-pay | Admitting: Emergency Medicine

## 2022-08-31 DIAGNOSIS — L509 Urticaria, unspecified: Secondary | ICD-10-CM | POA: Diagnosis not present

## 2022-08-31 MED ORDER — PREDNISONE 20 MG PO TABS: 40.0000 mg | ORAL_TABLET | Freq: Every day | ORAL | 0 refills | Status: AC

## 2022-08-31 NOTE — ED Provider Notes (Signed)
[facility]    CSN: [id number] Arrival date & time: 08/31/22  0737      History   Chief Complaint Chief Complaint  Patient presents with   Rash    HPI Sheila Davis is a 41 y.o. female.   Here today for evaluation of hives to her lower abdomen, chest, upper legs that started yesterday.  She reports that she was putting out her Christmas tree and decorations when she started to have itching.  She is not sure what triggered event.  She denies any new body soaps or lotions.  She has not had any shortness of breath or trouble breathing.  She denies any difficulty swallowing.  She reports she has had reaction in the past but this was a while ago and she is unsure what the trigger was.  She has not taken any medication today for symptoms.  The history is provided by the patient.  Rash Associated symptoms: no fever, no nausea and not vomiting     Past Medical History:  Diagnosis Date   Anxiety    Depression    pcp   GERD (gastroesophageal reflux disease)    History of stomach ulcers    Hypertension    Migraines    Obesity (BMI 30-39.9)    Seasonal allergies     Patient Active Problem List   Diagnosis Date Noted   Postpartum depression 06/03/2021   PMDD (premenstrual dysphoric disorder) 06/03/2021   Dysuria 06/03/2021   H/O tubal ligation 06/03/2021   Palpitations 10/26/2019   Gestational hypertension 10/20/2019   COVID-19 10/04/2019   Alpha thalassemia silent carrier 06/09/2019   History of 2 cesarean sections 05/31/2019   Abdominal hernia without obstruction and without gangrene 05/31/2019   Prediabetes 11/21/2017    Past Surgical History:  Procedure Laterality Date   CESAREAN SECTION      2002 & 2006   CESAREAN SECTION WITH BILATERAL TUBAL LIGATION Bilateral 10/23/2019   Procedure: CESAREAN SECTION WITH BILATERAL TUBAL LIGATION;  Surgeon: Adam Phenix, MD;  Location: [facility];  Service: Obstetrics;  Laterality: Bilateral;    OB History      Gravida  5   Para  3   Term  3   Preterm      AB  2   Living  3      SAB  2   IAB  0   Ectopic      Multiple  0   Live Births  3            Home Medications    Prior to Admission medications   Medication Sig Start Date End Date Taking? Authorizing Provider  predniSONE (DELTASONE) 20 MG tablet Take 2 tablets (40 mg total) by mouth daily with breakfast for 5 days. 08/31/22 09/05/22 Yes Tomi Bamberger, PA-C  ALPRAZolam Prudy Feeler) 0.5 MG tablet Take 1 tablet (0.5 mg total) by mouth 2 (two) times daily as needed for anxiety. 02/24/20   Brock Bad, MD  citalopram (CELEXA) 10 MG tablet SMARTSIG:1 Tablet(s) By Mouth Every Evening 04/10/22   [provider]  citalopram (CELEXA) 20 MG tablet Take 1 tablet (20 mg total) by mouth daily. 06/03/21   Leftwich-Kirby, Wilmer Floor, CNM  linaclotide (LINZESS) 145 MCG CAPS capsule Take 1 capsule (145 mcg total) by mouth daily before breakfast. 03/25/22   Danis, Andreas Blower, MD  metoCLOPramide (REGLAN) 5 MG tablet Take 1 tablet 30 minutes before each bowel prep. 03/25/22   Amada Jupiter  L III, MD  pantoprazole (PROTONIX) 20 MG tablet Take 20 mg by mouth daily. 02/10/22   [provider]  Probiotic Product (PROBIOTIC PO) Take by mouth.    [provider]  sucralfate (CARAFATE) 1 g tablet Take 1 g by mouth 4 (four) times daily. 03/16/22   [provider]    Family History Family History  Problem Relation Age of Onset   Cancer - Ovarian Mother 79   Heart disease Mother    Ovarian cancer Mother    Hypertension Father    COPD Father    Heart attack Maternal Grandmother    Lung cancer Maternal Grandfather    Diabetes Paternal Grandmother    Colon cancer Neg Hx    Stomach cancer Neg Hx    Esophageal cancer Neg Hx    Colon polyps Neg Hx     Social History Social History   Tobacco Use   Smoking status: Former    Packs/day: 0.20    Years: 0.50    Total pack years: 0.10    Types: Cigarettes    Quit  date: 09/02/2015    Years since quitting: 7.0   Smokeless tobacco: Never  Vaping Use   Vaping Use: Never used  Substance Use Topics   Alcohol use: Not Currently    Comment: rarely   Drug use: No     Allergies   Penicillins   Review of Systems Review of Systems  Constitutional:  Negative for chills and fever.  Eyes:  Negative for discharge and redness.  Gastrointestinal:  Negative for nausea and vomiting.  Skin:  Positive for rash.     Physical Exam Triage Vital Signs ED Triage Vitals  Enc Vitals Group     BP 08/31/22 1129 128/86     Pulse Rate 08/31/22 1129 90     Resp 08/31/22 1129 16     Temp 08/31/22 1129 98.4 F (36.9 C)     Temp Source 08/31/22 1129 Oral     SpO2 08/31/22 1129 94 %     Weight --      Height --      Head Circumference --      Peak Flow --      Pain Score 08/31/22 1144 3     Pain Loc --      Pain Edu? --      Excl. in GC? --    No data found.  Updated Vital Signs BP 128/86 (BP Location: Left Arm)   Pulse 90   Temp 98.4 F (36.9 C) (Oral)   Resp 16   SpO2 94%   Visual Acuity Right Eye Distance:   Left Eye Distance:   Bilateral Distance:    Right Eye Near:   Left Eye Near:    Bilateral Near:     Physical Exam Vitals and nursing note reviewed.  Constitutional:      General: She is not in acute distress.    Appearance: Normal appearance. She is not ill-appearing.  HENT:     Head: Normocephalic and atraumatic.  Eyes:     Conjunctiva/sclera: Conjunctivae normal.  Cardiovascular:     Rate and Rhythm: Normal rate.  Pulmonary:     Effort: Pulmonary effort is normal. No respiratory distress.  Skin:    Findings: Rash (erythematous wheals noted diffusely to lower abdomen, chest, bilateral upper legs) present.  Neurological:     Mental Status: She is alert.  Psychiatric:        Mood and Affect:  Mood normal.        Behavior: Behavior normal.        Thought Content: Thought content normal.      UC Treatments / Results   Labs (all labs ordered are listed, but only abnormal results are displayed) Labs Reviewed - No data to display  EKG   Radiology No results found.  Procedures Procedures (including critical care time)  Medications Ordered in UC Medications - No data to display  Initial Impression / Assessment and Plan / UC Course  I have reviewed the triage vital signs and the nursing notes.  Pertinent labs & imaging results that were available during my care of the patient were reviewed by me and considered in my medical decision making (see chart for details).    Unknown cause of what appears to be allergic reaction. Will treat with steroid - discussed injection in office vs oral burst- patient preferred burst and encouraged follow up with any further concerns.   Final Clinical Impressions(s) / UC Diagnoses   Final diagnoses:  Hives   Discharge Instructions   None    ED Prescriptions     Medication Sig Dispense Auth. Provider   predniSONE (DELTASONE) 20 MG tablet Take 2 tablets (40 mg total) by mouth daily with breakfast for 5 days. 10 tablet Tomi Bamberger, PA-C      PDMP not reviewed this encounter.   Tomi Bamberger, PA-C 08/31/22 1347

## 2022-08-31 NOTE — ED Triage Notes (Signed)
Pt here for generalized rash that is itchy and resembles hives starting yesterday

## 2022-09-02 ENCOUNTER — Ambulatory Visit (AMBULATORY_SURGERY_CENTER): Payer: Self-pay

## 2022-09-02 VITALS — Ht 65.0 in | Wt 282.6 lb

## 2022-09-02 DIAGNOSIS — K625 Hemorrhage of anus and rectum: Secondary | ICD-10-CM

## 2022-09-02 DIAGNOSIS — R140 Abdominal distension (gaseous): Secondary | ICD-10-CM

## 2022-09-02 DIAGNOSIS — R112 Nausea with vomiting, unspecified: Secondary | ICD-10-CM

## 2022-09-02 DIAGNOSIS — K5909 Other constipation: Secondary | ICD-10-CM

## 2022-09-02 DIAGNOSIS — R1012 Left upper quadrant pain: Secondary | ICD-10-CM

## 2022-09-02 MED ORDER — ONDANSETRON HCL 4 MG PO TABS: 4.0000 mg | ORAL_TABLET | ORAL | 0 refills | Status: DC

## 2022-09-02 MED ORDER — PLENVU 140 G PO SOLR: 1.0000 | Freq: Once | ORAL | 0 refills | Status: AC

## 2022-09-02 NOTE — Progress Notes (Signed)
No egg or soy allergy known to patient   No issues known to pt with past sedation with any surgeries or procedures  Patient denies ever being told they had issues or difficulty with intubation   No FH of Malignant Hyperthermia  Pt is not on diet pills  Pt is not on  home 02   Pt is not on blood thinners   Pt denies issues with constipation,  has had issues in the past , DUE TO PAST HX OF CONSTIPATION, WILL DO 2 DAY PLENVU/MIRALAX PREP  No A fib or A flutter  Have any cardiac testing pending--no  Pt instructed to use Single[employer]com or GoodRx for a price reduction on prep    Patient's chart reviewed by Cathlyn Parsons CNRA prior to previsit and patient appropriate for the [facility].  Previsit completed and red dot placed by patient's name on their procedure day (on provider's schedule).

## 2022-09-03 ENCOUNTER — Ambulatory Visit: Payer: Medicaid Other | Admitting: Podiatry

## 2022-09-25 ENCOUNTER — Encounter: Payer: Self-pay | Admitting: Certified Registered Nurse Anesthetist

## 2022-09-26 ENCOUNTER — Encounter: Payer: Self-pay | Admitting: Gastroenterology

## 2022-09-26 ENCOUNTER — Ambulatory Visit (AMBULATORY_SURGERY_CENTER): Payer: Medicaid Other | Admitting: Gastroenterology

## 2022-09-26 VITALS — BP 134/71 | HR 95 | Temp 98.0°F | Resp 19 | Ht 65.0 in | Wt 282.6 lb

## 2022-09-26 DIAGNOSIS — R1012 Left upper quadrant pain: Secondary | ICD-10-CM | POA: Diagnosis not present

## 2022-09-26 DIAGNOSIS — K649 Unspecified hemorrhoids: Secondary | ICD-10-CM

## 2022-09-26 DIAGNOSIS — K5909 Other constipation: Secondary | ICD-10-CM

## 2022-09-26 DIAGNOSIS — R112 Nausea with vomiting, unspecified: Secondary | ICD-10-CM | POA: Diagnosis not present

## 2022-09-26 DIAGNOSIS — K625 Hemorrhage of anus and rectum: Secondary | ICD-10-CM

## 2022-09-26 MED ORDER — SODIUM CHLORIDE 0.9 % IV SOLN
4.0000 mg | Freq: Once | INTRAVENOUS | Status: AC
  Administered 2022-09-26: 4 mg via INTRAVENOUS

## 2022-09-26 MED ORDER — SODIUM CHLORIDE 0.9 % IV SOLN: 500.0000 mL | Freq: Once | INTRAVENOUS | Status: DC

## 2022-09-26 NOTE — Progress Notes (Signed)
Report given to PACU, vss 

## 2022-09-26 NOTE — Patient Instructions (Signed)
YOU HAD AN ENDOSCOPIC PROCEDURE TODAY AT THE [facility]:   Refer to the procedure report that was given to you for any specific questions about what was found during the examination.  If the procedure report does not answer your questions, please call your gastroenterologist to clarify.  If you requested that your care partner not be given the details of your procedure findings, then the procedure report has been included in a sealed envelope for you to review at your convenience later.  YOU SHOULD EXPECT: Some feelings of bloating in the abdomen. Passage of more gas than usual.  Walking can help get rid of the air that was put into your GI tract during the procedure and reduce the bloating. If you had a lower endoscopy (such as a colonoscopy or flexible sigmoidoscopy) you may notice spotting of blood in your stool or on the toilet paper. If you underwent a bowel prep for your procedure, you may not have a normal bowel movement for a few days.  Please Note:  You might notice some irritation and congestion in your nose or some drainage.  This is from the oxygen used during your procedure.  There is no need for concern and it should clear up in a day or so.  SYMPTOMS TO REPORT IMMEDIATELY:  Following lower endoscopy (colonoscopy or flexible sigmoidoscopy):  Excessive amounts of blood in the stool  Significant tenderness or worsening of abdominal pains  Swelling of the abdomen that is new, acute  Fever of 100F or higher  Following upper endoscopy (EGD)  Vomiting of blood or coffee ground material  New chest pain or pain under the shoulder blades  Painful or persistently difficult swallowing  New shortness of breath  Fever of 100F or higher  Black, tarry-looking stools  For urgent or emergent issues, a gastroenterologist can be reached at any hour by calling [phone]. Do not use MyChart messaging for urgent concerns.    DIET:  We do recommend a small meal at first, but  then you may proceed to your regular diet.  Drink plenty of fluids but you should avoid alcoholic beverages for 24 hours.  ACTIVITY:  You should plan to take it easy for the rest of today and you should NOT DRIVE or use heavy machinery until tomorrow (because of the sedation medicines used during the test).    FOLLOW UP: Our staff will call the number listed on your records the next business day following your procedure.  We will call around 7:15- 8:00 am to check on you and address any questions or concerns that you may have regarding the information given to you following your procedure. If we do not reach you, we will leave a message.     If any biopsies were taken you will be contacted by phone or by letter within the next 1-3 weeks.  Please call us at [phone] if you have not heard about the biopsies in 3 weeks.    SIGNATURES/CONFIDENTIALITY: You and/or your care partner have signed paperwork which will be entered into your electronic medical record.  These signatures attest to the fact that that the information above on your After Visit Summary has been reviewed and is understood.  Full responsibility of the confidentiality of this discharge information lies with you and/or your care-partner.  

## 2022-09-26 NOTE — Op Note (Signed)
[facility] Patient Name: Sheila Davis Procedure Date: 09/26/2022 8:43 AM MRN: [id number] Endoscopist: Sherilyn Cooter L. Myrtie Neither , MD, [id number] Age: 41 Referring MD:  Date of Birth: [birth year] Gender: Female Account #: [id number] Procedure:                Colonoscopy Indications:              Abdominal pain in the left upper quadrant, Rectal                            bleeding, Constipation Medicines:                Monitored Anesthesia Care Procedure:                Pre-Anesthesia Assessment:                           - Prior to the procedure, a History and Physical                            was performed, and patient medications and                            allergies were reviewed. The patient's tolerance of                            previous anesthesia was also reviewed. The risks                            and benefits of the procedure and the sedation                            options and risks were discussed with the patient.                            All questions were answered, and informed consent                            was obtained. Prior Anticoagulants: The patient has                            taken no anticoagulant or antiplatelet agents. ASA                            Grade Assessment: III - A patient with severe                            systemic disease. After reviewing the risks and                            benefits, the patient was deemed in satisfactory                            condition to undergo the procedure.  After obtaining informed consent, the colonoscope                            was passed under direct vision. Throughout the                            procedure, the patient's blood pressure, pulse, and                            oxygen saturations were monitored continuously. The                            Colonoscope was introduced through the anus and                            advanced to the the cecum,  identified by                            appendiceal orifice and ileocecal valve. The                            colonoscopy was performed without difficulty. The                            patient tolerated the procedure well. The quality                            of the bowel preparation was excellent. The                            ileocecal valve, appendiceal orifice, and rectum                            were photographed. Scope In: 8:45:28 AM Scope Out: 8:58:23 AM Scope Withdrawal Time: 0 hours 8 minutes 0 seconds  Total Procedure Duration: 0 hours 12 minutes 55 seconds  Findings:                 The perianal and digital rectal examinations were                            normal.                           Repeat examination of right colon under NBI                            performed.                           There is no endoscopic evidence of diverticula or                            polyps in the entire colon.  Internal hemorrhoids were found. The hemorrhoids                            were small.                           The exam was otherwise without abnormality on                            direct and retroflexion views. Complications:            No immediate complications. Estimated Blood Loss:     Estimated blood loss: none. Impression:               - Internal hemorrhoids.                           - The examination was otherwise normal on direct                            and retroflexion views.                           - No specimens collected.                           Patient reports all syptoms have subsided with diet                            and lifestyle changes leading to more regular BMs. Recommendation:           - Patient has a contact number available for                            emergencies. The signs and symptoms of potential                            delayed complications were discussed with the                             patient. Return to normal activities tomorrow.                            Written discharge instructions were provided to the                            patient.                           - Resume previous diet.                           - Continue present medications.                           - Repeat colonoscopy in 10 years for screening  purposes.                           - See the other procedure note for documentation of                            additional recommendations. Jhamari Markowicz L. Myrtie Neither, MD 09/26/2022 9:01:46 AM This report has been signed electronically.

## 2022-09-26 NOTE — Progress Notes (Signed)
Pt's states no medical or surgical changes since previsit or office visit.  Pt c/o nausea. MD made aware. Order received to give Zofran 4mg  IV.

## 2022-09-26 NOTE — Progress Notes (Signed)
0841 Robinul 0.1 mg IV given due large amount of secretions upon assessment.  MD made aware, vss  

## 2022-09-26 NOTE — Op Note (Signed)
[facility] Patient Name: Sheila Davis Procedure Date: 09/26/2022 8:42 AM MRN: [id number] Endoscopist: Sherilyn Cooter L. Myrtie Neither , MD, [id number] Age: 41 Referring MD:  Date of Birth: 09-06-1981 Gender: Female Account #: [id number] Procedure:                Upper GI endoscopy Indications:              Abdominal pain in the left upper quadrant, Nausea                            with vomiting Medicines:                Monitored Anesthesia Care Procedure:                Pre-Anesthesia Assessment:                           - Prior to the procedure, a History and Physical                            was performed, and patient medications and                            allergies were reviewed. The patient's tolerance of                            previous anesthesia was also reviewed. The risks                            and benefits of the procedure and the sedation                            options and risks were discussed with the patient.                            All questions were answered, and informed consent                            was obtained. Prior Anticoagulants: The patient has                            taken no anticoagulant or antiplatelet agents. ASA                            Grade Assessment: III - A patient with severe                            systemic disease. After reviewing the risks and                            benefits, the patient was deemed in satisfactory                            condition to undergo the procedure.  After obtaining informed consent, the endoscope was                            passed under direct vision. Throughout the                            procedure, the patient's blood pressure, pulse, and                            oxygen saturations were monitored continuously. The                            Endoscope was introduced through the mouth, and                            advanced to the second part of  duodenum. The upper                            GI endoscopy was accomplished without difficulty.                            The patient tolerated the procedure well. Scope In: Scope Out: Findings:                 The larynx was normal.                           The esophagus was normal.                           The stomach was normal.                           The cardia and gastric fundus were normal on                            retroflexion.                           The examined duodenum was normal. Complications:            No immediate complications. Estimated Blood Loss:     Estimated blood loss: none. Impression:               - Normal larynx.                           - Normal esophagus.                           - Normal stomach.                           - Normal examined duodenum.                           - No specimens collected.                           -  See colonoscopy report. Patient reports symptoms                            have subsided with diet and lifestyle changes                            leading to more regular BMs. Recommendation:           - Patient has a contact number available for                            emergencies. The signs and symptoms of potential                            delayed complications were discussed with the                            patient. Return to normal activities tomorrow.                            Written discharge instructions were provided to the                            patient.                           - Resume previous diet.                           - Continue present medications.                           - See the other procedure note for documentation of                            additional recommendations.                           - Continue efforts at healthy diet and lifestyle to                            maintain regularity and control digestive symptoms.                           Return to my office  as needed. Mariaclara Spear L. Myrtie Neither, MD 09/26/2022 9:11:05 AM This report has been signed electronically.

## 2022-09-26 NOTE — Progress Notes (Signed)
History and Physical:  This patient presents for endoscopic testing for: Encounter Diagnoses  Name Primary?   LUQ pain Yes   Rectal bleeding    Chronic constipation    Nausea and vomiting in adult     Patient here for evaluation of multiple GI symptoms noted above.  Clinical details in my office consult note of 03/25/2022. CT abdomen and pelvis 03/31/2022 without findings to explain symptoms.  Trial of Linzess was given to patient. Patient is otherwise without complaints or active issues today.   Past Medical History: Past Medical History:  Diagnosis Date   Allergy    Anxiety    Depression    pcp   GERD (gastroesophageal reflux disease)    History of stomach ulcers    Hypertension    Migraines    Obesity (BMI 30-39.9)    Seasonal allergies      Past Surgical History: Past Surgical History:  Procedure Laterality Date   CESAREAN SECTION      2002 & 2006   CESAREAN SECTION WITH BILATERAL TUBAL LIGATION Bilateral 10/23/2019   Procedure: CESAREAN SECTION WITH BILATERAL TUBAL LIGATION;  Surgeon: Adam Phenix, MD;  Location: [facility];  Service: Obstetrics;  Laterality: Bilateral;   COLONOSCOPY     UPPER GASTROINTESTINAL ENDOSCOPY      Allergies: Allergies  Allergen Reactions   Penicillins Rash    Has patient had a PCN reaction causing immediate rash, facial/tongue/throat swelling, SOB or lightheadedness with hypotension: no Has patient had a PCN reaction causing severe rash involving mucus membranes or skin necrosis: white puss Has patient had a PCN reaction that required hospitalization no Has patient had a PCN reaction occurring within the last 10 years: yes If all of the above answers are "NO", then may proceed with Cephalosporin use.     Outpatient Meds: Current Outpatient Medications  Medication Sig Dispense Refill   ALPRAZolam (XANAX) 0.5 MG tablet Take 1 tablet (0.5 mg total) by mouth 2 (two) times daily as needed for anxiety. 60 tablet 2   citalopram  (CELEXA) 10 MG tablet SMARTSIG:1 Tablet(s) By Mouth Every Evening     pantoprazole (PROTONIX) 20 MG tablet Take 20 mg by mouth daily.     Probiotic Product (PROBIOTIC PO) Take by mouth.     sucralfate (CARAFATE) 1 g tablet Take 1 g by mouth 4 (four) times daily. Takes as needed     diclofenac Sodium (VOLTAREN) 1 % GEL SMARTSIG:Gram(s) Topical 3 Times Daily (Patient not taking: Reported on 09/02/2022)     fluticasone (FLONASE) 50 MCG/ACT nasal spray Place 1 spray into both nostrils daily. (Patient not taking: Reported on 09/02/2022)     linaclotide (LINZESS) 145 MCG CAPS capsule Take 1 capsule (145 mcg total) by mouth daily before breakfast. (Patient not taking: Reported on 09/02/2022) 4 capsule 0   meloxicam (MOBIC) 15 MG tablet Take 15 mg by mouth daily. (Patient not taking: Reported on 09/02/2022)     metoCLOPramide (REGLAN) 5 MG tablet Take 1 tablet 30 minutes before each bowel prep. (Patient not taking: Reported on 09/02/2022) 2 tablet 0   ofloxacin (OCUFLOX) 0.3 % ophthalmic solution SMARTSIG:In Eye(s) (Patient not taking: Reported on 09/02/2022)     ondansetron (ZOFRAN) 4 MG tablet Take 1 tablet (4 mg total) by mouth as directed for 3 doses. Take one Zofran 4 mg tablet 30-60 minutes before each prep dose (Patient not taking: Reported on 09/26/2022) 3 tablet 0   sulindac (CLINORIL) 200 MG tablet Take 200 mg by mouth  2 (two) times daily as needed. (Patient not taking: Reported on 09/02/2022)     tiZANidine (ZANAFLEX) 2 MG tablet Take 2 mg by mouth every 8 (eight) hours as needed. (Patient not taking: Reported on 09/02/2022)     triamcinolone ointment (KENALOG) 0.1 % Apply topically. (Patient not taking: Reported on 09/02/2022)     Current Facility-Administered Medications  Medication Dose Route Frequency Provider Last Rate Last Admin   0.9 %  sodium chloride infusion  500 mL Intravenous Once Danis, Estill Cotta III, MD           ___________________________________________________________________ Objective   Exam:  BP (!) 146/105   Pulse (!) 113   Temp 98 F (36.7 C) (Temporal)   Ht 5\' 5"  (1.651 m)   Wt 282 lb 9.6 oz (128.2 kg)   LMP 08/13/2022 (Exact Date) Comment: Tubal ligation  SpO2 96%   BMI 47.03 kg/m   CV: regular , S1/S2 Resp: clear to auscultation bilaterally, normal RR and effort noted GI: soft, no tenderness, with active bowel sounds.   Assessment: Encounter Diagnoses  Name Primary?   LUQ pain Yes   Rectal bleeding    Chronic constipation    Nausea and vomiting in adult      Plan: Colonoscopy EGD  The benefits and risks of the planned procedure were described in detail with the patient or (when appropriate) their health care proxy.  Risks were outlined as including, but not limited to, bleeding, infection, perforation, adverse medication reaction leading to cardiac or pulmonary decompensation, pancreatitis (if ERCP).  The limitation of incomplete mucosal visualization was also discussed.  No guarantees or warranties were given.    The patient is appropriate for an endoscopic procedure in the ambulatory setting.   - Wilfrid Lund, MD

## 2022-10-01 ENCOUNTER — Telehealth: Payer: Self-pay

## 2022-10-01 NOTE — Telephone Encounter (Signed)
  Follow up Call-     09/26/2022    7:43 AM  Call back number  Post procedure Call Back phone  # [phone]  Permission to leave phone message Yes     Patient questions:  Do you have a fever, pain , or abdominal swelling? No. Pain Score  0 *  Have you tolerated food without any problems? Yes.    Have you been able to return to your normal activities? Yes.    Do you have any questions about your discharge instructions: Diet   No. Medications  No. Follow up visit  No.  Do you have questions or concerns about your Care? No.  Actions: * If pain score is 4 or above: No action needed, pain <4.

## 2022-10-10 ENCOUNTER — Other Ambulatory Visit: Payer: Self-pay | Admitting: Obstetrics

## 2022-10-10 DIAGNOSIS — Z1231 Encounter for screening mammogram for malignant neoplasm of breast: Secondary | ICD-10-CM

## 2022-11-28 ENCOUNTER — Ambulatory Visit
Admission: RE | Admit: 2022-11-28 | Discharge: 2022-11-28 | Disposition: A | Discharge status: Home / Self Care | Payer: Medicaid Other | Source: Ambulatory Visit | Attending: Obstetrics | Admitting: Obstetrics

## 2022-11-28 DIAGNOSIS — Z1231 Encounter for screening mammogram for malignant neoplasm of breast: Secondary | ICD-10-CM

## 2023-02-04 ENCOUNTER — Encounter (HOSPITAL_BASED_OUTPATIENT_CLINIC_OR_DEPARTMENT_OTHER): Payer: Self-pay

## 2023-02-04 ENCOUNTER — Emergency Department (HOSPITAL_BASED_OUTPATIENT_CLINIC_OR_DEPARTMENT_OTHER): Payer: Medicaid Other | Admitting: Radiology

## 2023-02-04 ENCOUNTER — Other Ambulatory Visit: Payer: Self-pay

## 2023-02-04 ENCOUNTER — Emergency Department (HOSPITAL_BASED_OUTPATIENT_CLINIC_OR_DEPARTMENT_OTHER)
Admission: EM | Admit: 2023-02-04 | Discharge: 2023-02-04 | Discharge status: Against Medical Advice | Payer: Medicaid Other | Attending: Emergency Medicine | Admitting: Emergency Medicine

## 2023-02-04 DIAGNOSIS — Z5321 Procedure and treatment not carried out due to patient leaving prior to being seen by health care provider: Secondary | ICD-10-CM | POA: Diagnosis not present

## 2023-02-04 DIAGNOSIS — R079 Chest pain, unspecified: Secondary | ICD-10-CM | POA: Diagnosis present

## 2023-02-04 LAB — CBC
HCT: 38.5 % (ref 36.0–46.0)
Hemoglobin: 11.8 g/dL — ABNORMAL LOW (ref 12.0–15.0)
MCH: 25.8 pg — ABNORMAL LOW (ref 26.0–34.0)
MCHC: 30.6 g/dL (ref 30.0–36.0)
MCV: 84.1 fL (ref 80.0–100.0)
Platelets: 414 10*3/uL — ABNORMAL HIGH (ref 150–400)
RBC: 4.58 MIL/uL (ref 3.87–5.11)
RDW: 14.5 % (ref 11.5–15.5)
WBC: 7.7 10*3/uL (ref 4.0–10.5)
nRBC: 0 % (ref 0.0–0.2)

## 2023-02-04 LAB — BASIC METABOLIC PANEL
Anion gap: 10 (ref 5–15)
BUN: 9 mg/dL (ref 6–20)
CO2: 24 mmol/L (ref 22–32)
Calcium: 9 mg/dL (ref 8.9–10.3)
Chloride: 106 mmol/L (ref 98–111)
Creatinine, Ser: 0.89 mg/dL (ref 0.44–1.00)
GFR, Estimated: >60 mL/min (ref >60)
Glucose, Bld: 86 mg/dL (ref 70–99)
Potassium: 3.5 mmol/L (ref 3.5–5.1)
Sodium: 140 mmol/L (ref 135–145)

## 2023-02-04 LAB — HCG, SERUM, QUALITATIVE: Preg, Serum: NEGATIVE

## 2023-02-04 LAB — TROPONIN I (HIGH SENSITIVITY): Troponin I (High Sensitivity): <2 ng/L (ref <18)

## 2023-02-04 NOTE — ED Triage Notes (Signed)
Patient here POV from Home.  Endorses Left to Mid CP Intermittently for a few weeks. No SOB. Had a PCP appointment today and was instructed to seek ED Evaluation for EKG Abnormalities (A. Flutter, No History of A. Fib.).   NAD Noted during Triage. A&Ox4. GCS 15. Ambulatory.

## 2023-02-26 ENCOUNTER — Ambulatory Visit: Payer: Medicaid Other | Admitting: Cardiology

## 2023-03-05 ENCOUNTER — Ambulatory Visit
Admission: EM | Admit: 2023-03-05 | Discharge: 2023-03-05 | Disposition: A | Discharge status: Home / Self Care | Payer: Medicaid Other | Attending: Family Medicine | Admitting: Family Medicine

## 2023-03-05 DIAGNOSIS — L500 Allergic urticaria: Secondary | ICD-10-CM | POA: Diagnosis not present

## 2023-03-05 MED ORDER — PREDNISONE 20 MG PO TABS: 40.0000 mg | ORAL_TABLET | Freq: Every day | ORAL | 0 refills | Status: AC

## 2023-03-05 MED ORDER — CETIRIZINE HCL 10 MG PO TABS: 10.0000 mg | ORAL_TABLET | Freq: Every day | ORAL | 0 refills | Status: AC | PRN

## 2023-03-05 MED ORDER — METHYLPREDNISOLONE ACETATE 80 MG/ML IJ SUSP
80.0000 mg | Freq: Once | INTRAMUSCULAR | Status: AC
  Administered 2023-03-05: 80 mg via INTRAMUSCULAR

## 2023-03-05 NOTE — Discharge Instructions (Addendum)
You have been given a shot of methylprednisolone 80 mg.  Take prednisone 20 mg--2 daily for 5 days  Zyrtec/cetirizine 10 mg tablet--take 1 daily as needed for allergy or itching.

## 2023-03-05 NOTE — ED Triage Notes (Signed)
Pt states she woke up this morning and now has raised red bumps/hives all over body. Pt states it has gotten worse as the day progress. Does not know where it could have came from no allergies to anything that she knows of.

## 2023-03-05 NOTE — ED Provider Notes (Signed)
[facility]    CSN: [id number] Arrival date & time: 03/05/23  1422      History   Chief Complaint Chief Complaint  Patient presents with   Rash    HPI Sheila Davis is a 42 y.o. female.    Rash  Here for rash and itching that was first noted this morning when she woke up.  It has worsened over the day.  No fever and no trouble breathing.  She has had a similar rash before but not to this extent.  No history of diabetes    Past Medical History:  Diagnosis Date   Allergy    Anxiety    Depression    pcp   GERD (gastroesophageal reflux disease)    History of stomach ulcers    Hypertension    Migraines    Obesity (BMI 30-39.9)    Seasonal allergies     Patient Active Problem List   Diagnosis Date Noted   Postpartum depression 06/03/2021   PMDD (premenstrual dysphoric disorder) 06/03/2021   Dysuria 06/03/2021   H/O tubal ligation 06/03/2021   Palpitations 10/26/2019   Gestational hypertension 10/20/2019   COVID-19 10/04/2019   Alpha thalassemia silent carrier 06/09/2019   History of 2 cesarean sections 05/31/2019   Abdominal hernia without obstruction and without gangrene 05/31/2019   Prediabetes 11/21/2017    Past Surgical History:  Procedure Laterality Date   CESAREAN SECTION      2002 & 2006   CESAREAN SECTION WITH BILATERAL TUBAL LIGATION Bilateral 10/23/2019   Procedure: CESAREAN SECTION WITH BILATERAL TUBAL LIGATION;  Surgeon: Adam Phenix, MD;  Location: [facility];  Service: Obstetrics;  Laterality: Bilateral;   COLONOSCOPY     UPPER GASTROINTESTINAL ENDOSCOPY      OB History     Gravida  5   Para  3   Term  3   Preterm      AB  2   Living  3      SAB  2   IAB  0   Ectopic      Multiple  0   Live Births  3            Home Medications    Prior to Admission medications   Medication Sig Start Date End Date Taking? Authorizing Provider  ALPRAZolam Prudy Feeler) 0.5 MG tablet Take 1 tablet (0.5 mg total) by  mouth 2 (two) times daily as needed for anxiety. 02/24/20  Yes Brock Bad, MD  cetirizine (ZYRTEC ALLERGY) 10 MG tablet Take 1 tablet (10 mg total) by mouth daily as needed for allergies. 03/05/23  Yes Zenia Resides, MD  citalopram (CELEXA) 10 MG tablet SMARTSIG:1 Tablet(s) By Mouth Every Evening 04/10/22  Yes [provider]  predniSONE (DELTASONE) 20 MG tablet Take 2 tablets (40 mg total) by mouth daily with breakfast for 5 days. 03/05/23 03/10/23 Yes Zenia Resides, MD  Probiotic Product (PROBIOTIC PO) Take by mouth.   Yes [provider]    Family History Family History  Problem Relation Age of Onset   Cancer - Ovarian Mother 33   Heart disease Mother    Ovarian cancer Mother    Hypertension Father    COPD Father    Colon polyps Brother    Heart attack Maternal Grandmother    Lung cancer Maternal Grandfather    Diabetes Paternal Grandmother    Colon cancer Neg Hx    Stomach cancer Neg Hx    Esophageal  cancer Neg Hx    Rectal cancer Neg Hx     Social History Social History   Tobacco Use   Smoking status: Former    Packs/day: 0.20    Years: 0.50    Additional pack years: 0.00    Total pack years: 0.10    Types: Cigarettes    Quit date: 09/02/2015    Years since quitting: 7.5   Smokeless tobacco: Never   Tobacco comments:    Wasn't daily smoker, only socially or occasionally at the time, quit I 2016  Vaping Use   Vaping Use: Never used  Substance Use Topics   Alcohol use: Not Currently    Comment: rarely   Drug use: Never     Allergies   Penicillins   Review of Systems Review of Systems  Skin:  Positive for rash.     Physical Exam Triage Vital Signs ED Triage Vitals [03/05/23 1506]  Enc Vitals Group     BP 124/80     Pulse Rate 85     Resp 16     Temp 98 F (36.7 C)     Temp Source Oral     SpO2 96 %     Weight      Height      Head Circumference      Peak Flow      Pain Score 0     Pain Loc      Pain Edu?       Excl. in GC?    No data found.  Updated Vital Signs BP 124/80 (BP Location: Left Arm)   Pulse 85   Temp 98 F (36.7 C) (Oral)   Resp 16   LMP 03/02/2023 (Exact Date)   SpO2 96%   Visual Acuity Right Eye Distance:   Left Eye Distance:   Bilateral Distance:    Right Eye Near:   Left Eye Near:    Bilateral Near:     Physical Exam Vitals reviewed.  Constitutional:      General: She is not in acute distress.    Appearance: She is not ill-appearing, toxic-appearing or diaphoretic.  HENT:     Mouth/Throat:     Mouth: Mucous membranes are moist.  Eyes:     Extraocular Movements: Extraocular movements intact.     Conjunctiva/sclera: Conjunctivae normal.     Pupils: Pupils are equal, round, and reactive to light.  Cardiovascular:     Rate and Rhythm: Normal rate and regular rhythm.     Heart sounds: No murmur heard. Pulmonary:     Effort: Pulmonary effort is normal. No respiratory distress.     Breath sounds: Normal breath sounds. No stridor. No wheezing, rhonchi or rales.  Musculoskeletal:     Cervical back: Neck supple.  Lymphadenopathy:     Cervical: No cervical adenopathy.  Skin:    Coloration: Skin is not jaundiced or pale.     Comments: There is urticarial rash on her arms, trunk, and her legs.  Neurological:     General: No focal deficit present.     Mental Status: She is alert and oriented to person, place, and time.  Psychiatric:        Behavior: Behavior normal.      UC Treatments / Results  Labs (all labs ordered are listed, but only abnormal results are displayed) Labs Reviewed - No data to display  EKG   Radiology No results found.  Procedures Procedures (including critical care time)  Medications Ordered  in UC Medications  methylPREDNISolone acetate (DEPO-MEDROL) injection 80 mg (has no administration in time range)    Initial Impression / Assessment and Plan / UC Course  I have reviewed the triage vital signs and the nursing  notes.  Pertinent labs & imaging results that were available during my care of the patient were reviewed by me and considered in my medical decision making (see chart for details).        She is given Depo-Medrol here in the office, and then prednisone for 5 days is sent into the pharmacy.  I have also sent in Zyrtec as needed for the rash and itching. Final Clinical Impressions(s) / UC Diagnoses   Final diagnoses:  Allergic urticaria     Discharge Instructions      You have been given a shot of methylprednisolone 80 mg.  Take prednisone 20 mg--2 daily for 5 days  Zyrtec/cetirizine 10 mg tablet--take 1 daily as needed for allergy or itching.       ED Prescriptions     Medication Sig Dispense Auth. Provider   predniSONE (DELTASONE) 20 MG tablet Take 2 tablets (40 mg total) by mouth daily with breakfast for 5 days. 10 tablet Zenia Resides, MD   cetirizine (ZYRTEC ALLERGY) 10 MG tablet Take 1 tablet (10 mg total) by mouth daily as needed for allergies. 30 tablet Rexanne Inocencio, Janace Aris, MD      PDMP not reviewed this encounter.   Zenia Resides, MD 03/05/23 [phone]

## 2023-03-06 ENCOUNTER — Ambulatory Visit: Payer: Medicaid Other | Admitting: Obstetrics and Gynecology

## 2023-03-13 ENCOUNTER — Ambulatory Visit: Payer: Medicaid Other | Admitting: Cardiology

## 2023-04-29 ENCOUNTER — Ambulatory Visit: Payer: MEDICAID | Admitting: Obstetrics and Gynecology

## 2023-07-06 ENCOUNTER — Encounter: Payer: Self-pay | Admitting: Obstetrics and Gynecology

## 2023-07-06 ENCOUNTER — Ambulatory Visit (INDEPENDENT_AMBULATORY_CARE_PROVIDER_SITE_OTHER): Payer: Managed Care, Other (non HMO) | Admitting: Obstetrics and Gynecology

## 2023-07-06 VITALS — BP 146/92 | HR 91 | Ht 65.0 in | Wt 284.0 lb

## 2023-07-06 DIAGNOSIS — N764 Abscess of vulva: Secondary | ICD-10-CM | POA: Diagnosis not present

## 2023-07-06 MED ORDER — CEPHALEXIN 500 MG PO CAPS
500.0000 mg | ORAL_CAPSULE | Freq: Three times a day (TID) | ORAL | 0 refills | Status: DC
Start: 2023-07-06 — End: 2024-01-16

## 2023-07-06 NOTE — Progress Notes (Signed)
Pt is in the office for GYN visit.  Pt reports a boil near her R labia for about a week and right sided pelvic pain. Reports pain from boil 6/10 and she has applied warm compresses and Bactroban at home.

## 2023-07-06 NOTE — Progress Notes (Signed)
Sheila Davis presents with c/o vulvar boil for the last week. Has not responded to home treatment. No drainage Has had some right lower pelvic pain as well  H/O BTL Pap smear UTD  PE AF VSS Chaperone present Lungs clear Heart RRR Abd soft + BS GU small 1/2 x 1/2 cm boil on right vulva, no fluctuance noted  A/P Vulvar boil  Will continue with warm compresses. Keflex 500 mg tid x 7 days. Pt reports taking in the past without problems. Will follow pelvic discomfort for now as it started with current boil Sx. Pt instructed to contact office if Sx persist after boil resolves

## 2023-07-15 IMAGING — MG DIGITAL DIAGNOSTIC BILAT W/ TOMO W/ CAD
8 of 14 series · 8 of 40 positions shown · non-contrast
Comparison: Previous exams.

CLINICAL DATA: 40-year-old female with nonfocal left breast pain.

EXAM:
DIGITAL DIAGNOSTIC BILATERAL MAMMOGRAM WITH TOMOSYNTHESIS AND CAD
TECHNIQUE: Bilateral digital diagnostic mammography and breast tomosynthesis
was performed. The images were evaluated with computer-aided
detection.

[R MLO synth-2D (1 of 2)]
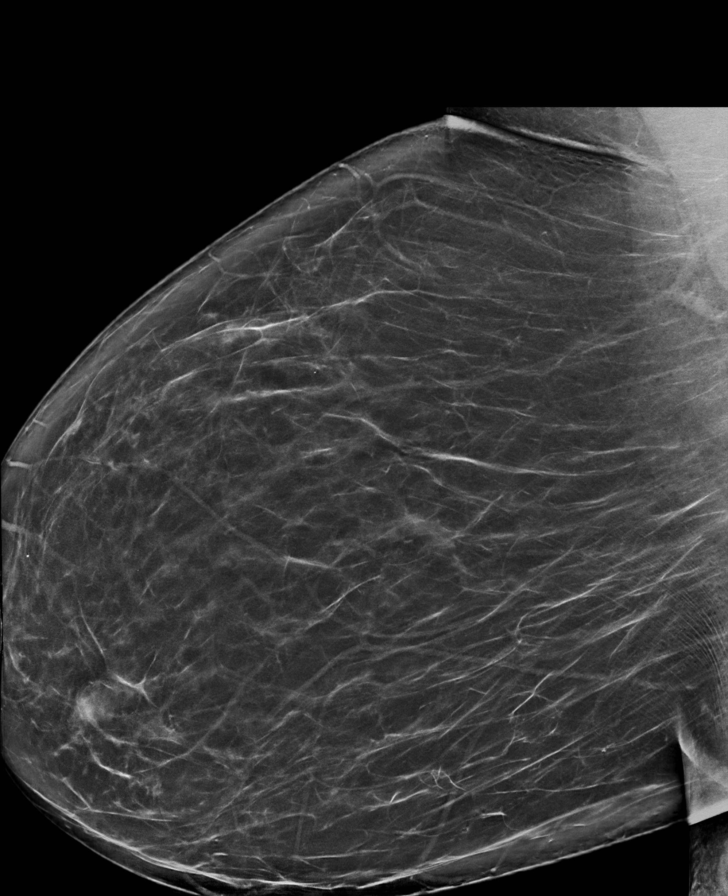

[R CC synth-2D]
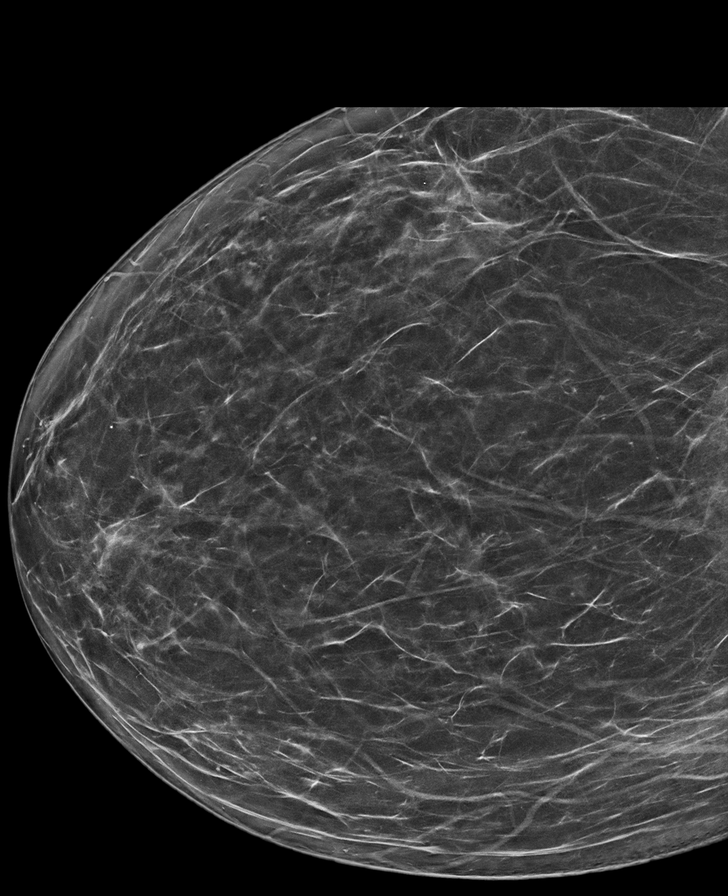

[L MLO synth-2D (1 of 2)]
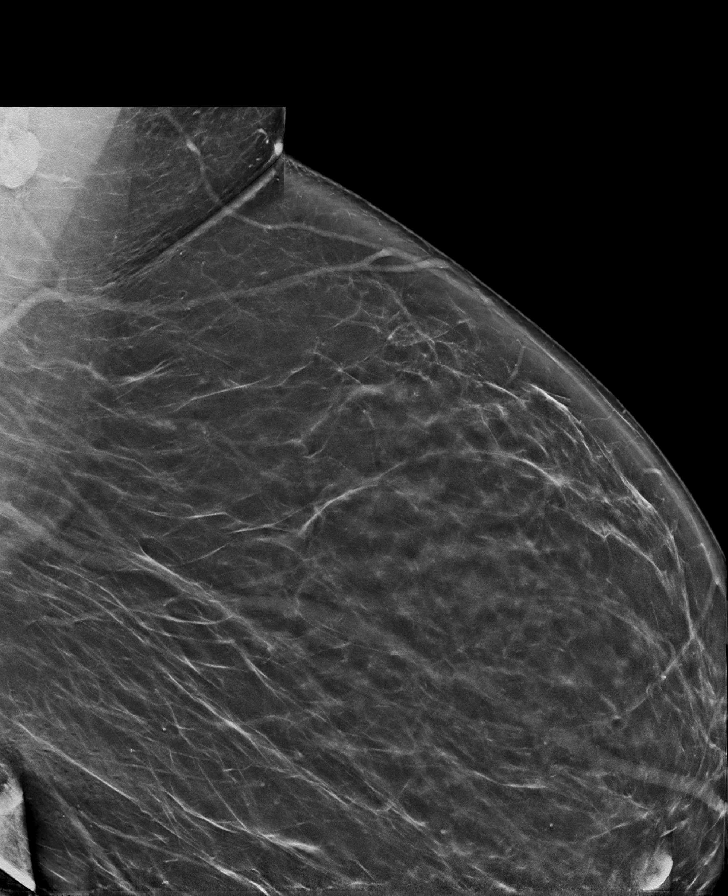

[L CC synth-2D (1 of 2)]
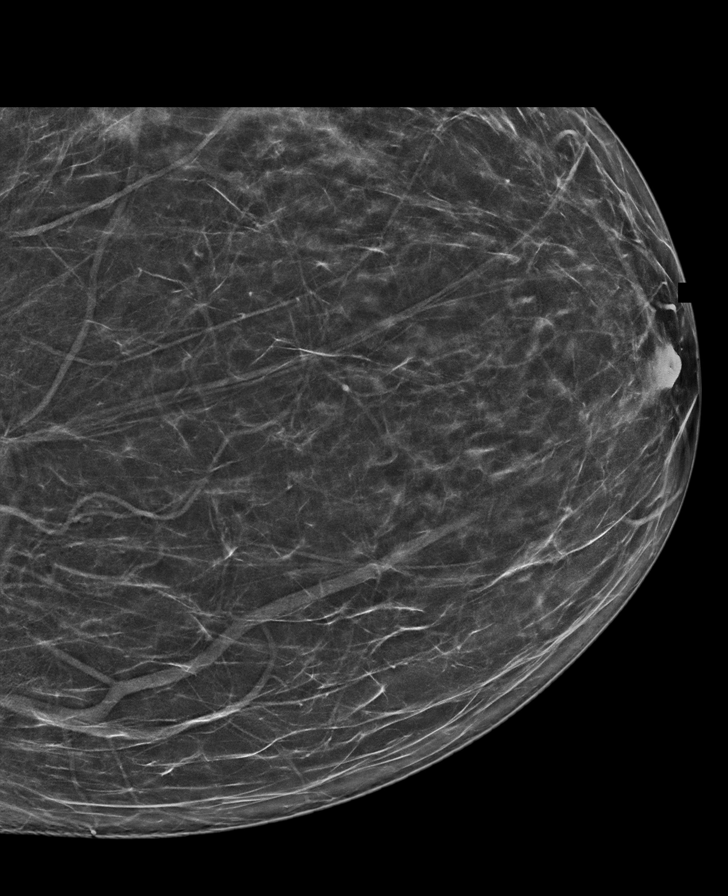

[L MLO synth-2D (2 of 2)]
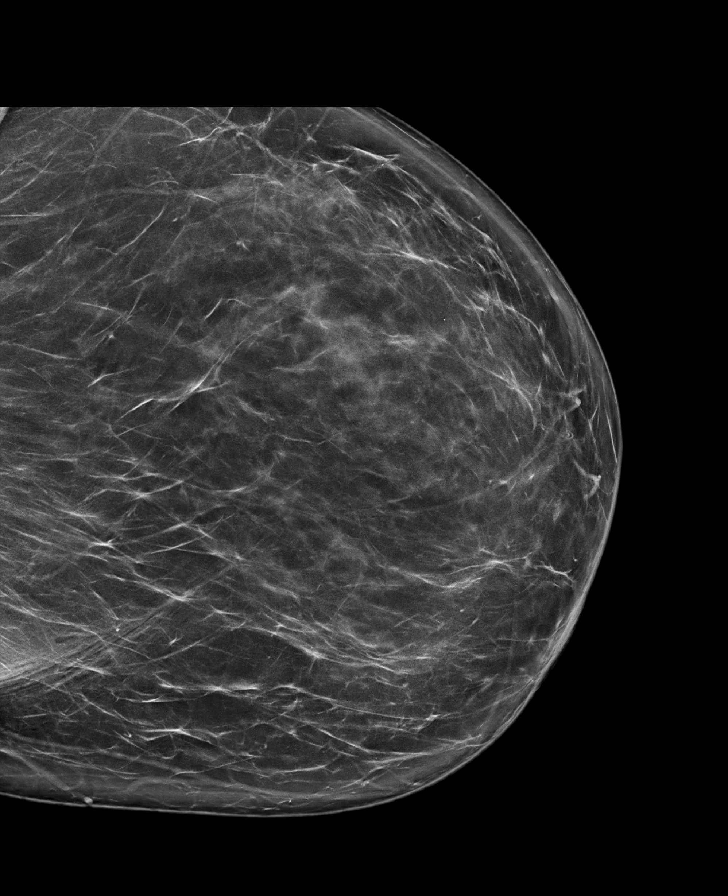

[L CC synth-2D (2 of 2)]
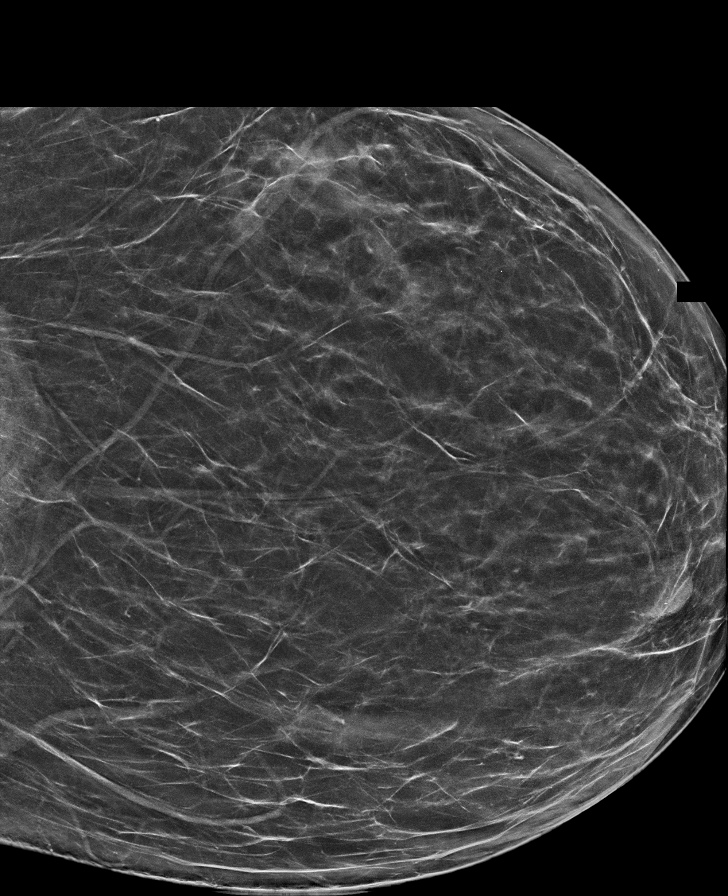

[R MLO synth-2D (2 of 2)]
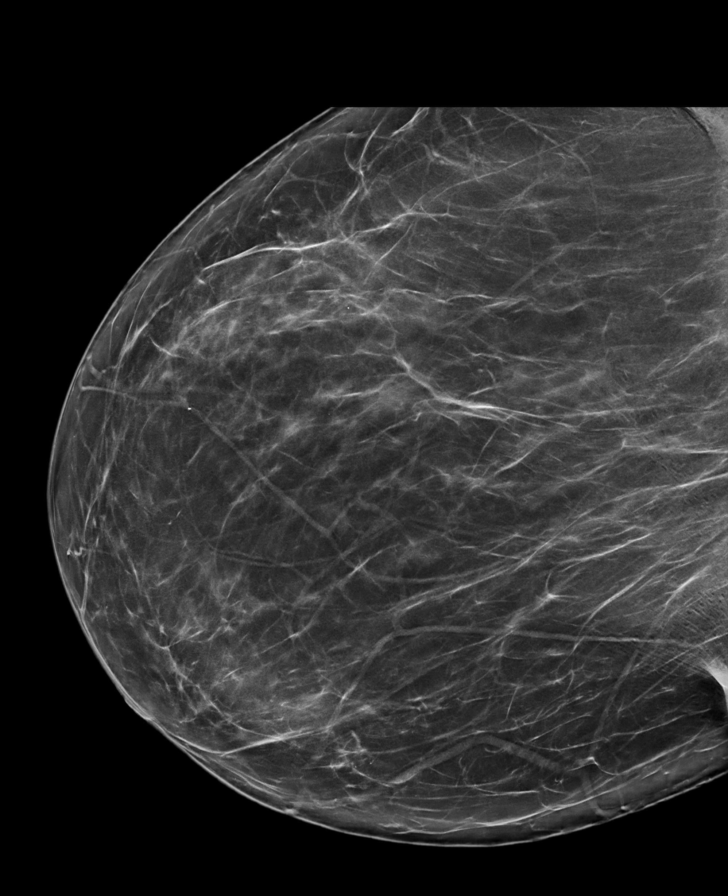

[L CC tomo · tomo slice 43/85.0]
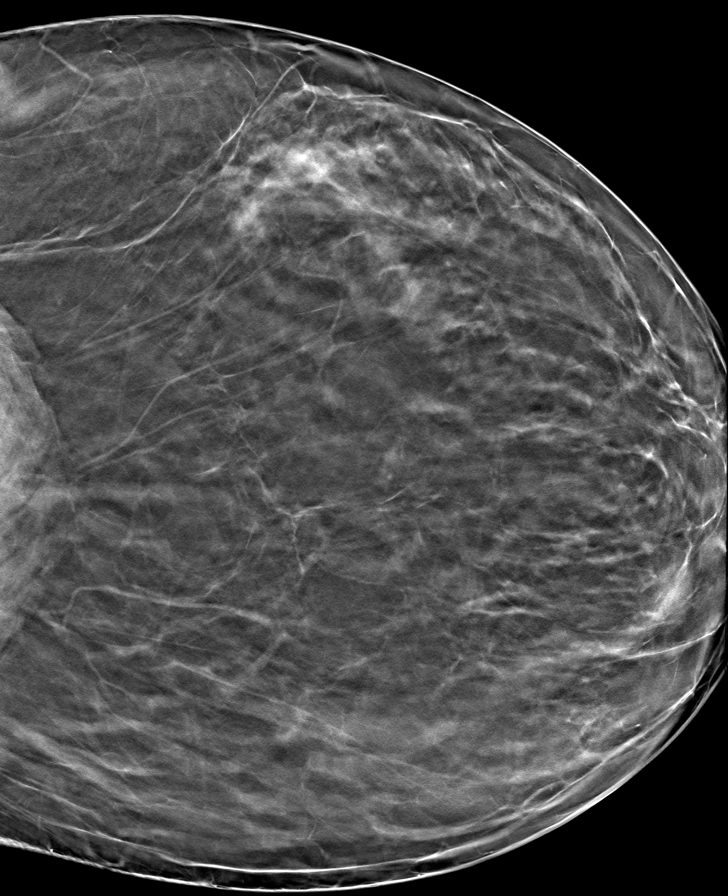

[8 of 40 positions shown; findings below may reference images not displayed]

ACR Breast Density Category b: There are scattered areas of
fibroglandular density.
FINDINGS: No suspicious masses or calcifications seen in either breast. There
is no mammographic evidence of malignancy in either breast.
IMPRESSION: No mammographic evidence of malignancy in either breast.

RECOMMENDATION:
1. Recommend further management of nonfocal left breast pain be
based on clinical assessment.

2.  Screening mammogram in one year.(Code:[id number])

I have discussed the findings and recommendations with the patient.
If applicable, a reminder letter will be sent to the patient
regarding the next appointment.

BI-RADS CATEGORY  1: Negative.

## 2023-08-12 ENCOUNTER — Ambulatory Visit: Payer: Managed Care, Other (non HMO) | Admitting: Obstetrics and Gynecology

## 2023-11-05 ENCOUNTER — Ambulatory Visit: Payer: Managed Care, Other (non HMO) | Admitting: Obstetrics and Gynecology

## 2023-11-05 NOTE — Progress Notes (Deleted)
 ANNUAL EXAM Patient name: Sheila Davis MRN [id number]  Date of birth: 05/02/1981 Chief Complaint:   No chief complaint on file.  History of Present Illness:   Sheila Davis is a 43 y.o. [id number] s/p BTL with No LMP recorded. being seen today for a routine annual exam.  Current complaints: ***  S/p BTL  Last pap 02/20/22. Results were: NILM w/ HRHPV negative. H/O abnormal pap: no Last mammogram: 11/28/22. Results were: normal. Family h/o breast cancer: {yes***/no:23838} Last colonoscopy: n/a. Results were: N/A. Family h/o colorectal cancer: {yes***/no:23838} HPV vaccine: ***     02/20/2022    1:21 PM 02/24/2020   10:08 AM 11/19/2017   10:33 AM 08/16/2015    8:45 AM 07/16/2015    8:31 AM  Depression screen PHQ 2/9  Decreased Interest 1 3 1  0 0  Down, Depressed, Hopeless 1 2 1  0 0  PHQ - 2 Score 2 5 2  0 0  Altered sleeping 1 2 2     Tired, decreased energy 0 1 2    Change in appetite 1 2 2     Feeling bad or failure about yourself  1 2 2     Trouble concentrating 1 2 2     Moving slowly or fidgety/restless 0 0 0    Suicidal thoughts 0 1 1    PHQ-9 Score 6 15 13     Difficult doing work/chores   Somewhat difficult          02/20/2022    1:22 PM  GAD 7 : Generalized Anxiety Score  Nervous, Anxious, on Edge 1  Control/stop worrying 0  Worry too much - different things 2  Trouble relaxing 1  Restless 0  Easily annoyed or irritable 0  Afraid - awful might happen 0  Total GAD 7 Score 4     Review of Systems:   Pertinent items are noted in HPI Denies any headaches, blurred vision, fatigue, shortness of breath, chest pain, abdominal pain, abnormal vaginal discharge/itching/odor/irritation, problems with periods, bowel movements, urination, or intercourse unless otherwise stated above. Pertinent History Reviewed:  Reviewed past medical,surgical, social and family history.  Reviewed problem list, medications and allergies. Physical Assessment:  There were no vitals filed for  this visit.There is no height or weight on file to calculate BMI.        Physical Examination:   General appearance - well appearing, and in no distress  Mental status - alert, oriented to person, place, and time  Chest - respiratory effort normal  Heart - normal peripheral perfusion  Breasts - breasts appear normal, no suspicious masses, no skin or nipple changes or axillary nodes  Abdomen - soft, nontender, nondistended, no masses or organomegaly  Pelvic - VULVA: normal appearing vulva with no masses, tenderness or lesions  VAGINA: normal appearing vagina with normal color and discharge, no lesions  CERVIX: normal appearing cervix without discharge or lesions, no CMT  Thin prep pap is {Desc; done/not:10129} *** HR HPV cotesting  UTERUS: uterus is felt to be normal size, shape, consistency and nontender   ADNEXA: No adnexal masses or tenderness noted.  Chaperone present for exam  No results found for this or any previous visit (from the past 24 hours).  Assessment & Plan:  1) Well-Woman Exam Mammogram: schedule screening mammo as soon as possible, or sooner if problems Colonoscopy: {TCS f/u:25213::"@ 43yo"}, or sooner if problems Pap: Gardasil: GC/CT: HIV/HCV:  2) ***  Labs/procedures today: ***  No orders of the defined types were placed in this  encounter.   Meds: No orders of the defined types were placed in this encounter.   Follow-up: No follow-ups on file.  Lennart Pall, MD 11/05/2023 2:09 PM

## 2023-12-10 ENCOUNTER — Other Ambulatory Visit: Payer: Self-pay | Admitting: Obstetrics

## 2023-12-10 DIAGNOSIS — Z1231 Encounter for screening mammogram for malignant neoplasm of breast: Secondary | ICD-10-CM

## 2023-12-15 ENCOUNTER — Ambulatory Visit: Payer: MEDICAID

## 2023-12-15 ENCOUNTER — Ambulatory Visit: Payer: Managed Care, Other (non HMO) | Admitting: Obstetrics and Gynecology

## 2023-12-22 ENCOUNTER — Ambulatory Visit
Admission: EM | Admit: 2023-12-22 | Discharge: 2023-12-22 | Disposition: A | Discharge status: Home / Self Care | Payer: MEDICAID | Attending: Internal Medicine | Admitting: Internal Medicine

## 2023-12-22 DIAGNOSIS — Z113 Encounter for screening for infections with a predominantly sexual mode of transmission: Secondary | ICD-10-CM | POA: Diagnosis present

## 2023-12-22 DIAGNOSIS — A5602 Chlamydial vulvovaginitis: Secondary | ICD-10-CM | POA: Diagnosis not present

## 2023-12-22 DIAGNOSIS — R350 Frequency of micturition: Secondary | ICD-10-CM | POA: Diagnosis present

## 2023-12-22 DIAGNOSIS — R300 Dysuria: Secondary | ICD-10-CM | POA: Insufficient documentation

## 2023-12-22 DIAGNOSIS — N390 Urinary tract infection, site not specified: Secondary | ICD-10-CM | POA: Insufficient documentation

## 2023-12-22 LAB — POCT URINALYSIS DIP (MANUAL ENTRY)
Bilirubin, UA: NEGATIVE
Glucose, UA: NEGATIVE mg/dL
Ketones, POC UA: NEGATIVE mg/dL
Nitrite, UA: NEGATIVE
Protein Ur, POC: 30 mg/dL — AB
Spec Grav, UA: 1.025 (ref 1.010–1.025)
Urobilinogen, UA: 0.2 U/dL
pH, UA: 7 (ref 5.0–8.0)

## 2023-12-22 MED ORDER — DOXYCYCLINE HYCLATE 100 MG PO CAPS: 100.0000 mg | ORAL_CAPSULE | Freq: Two times a day (BID) | ORAL | 0 refills | Status: AC

## 2023-12-22 MED ORDER — PHENAZOPYRIDINE HCL 200 MG PO TABS: 200.0000 mg | ORAL_TABLET | Freq: Three times a day (TID) | ORAL | 0 refills | Status: DC

## 2023-12-22 MED ORDER — FLUCONAZOLE 150 MG PO TABS: 150.0000 mg | ORAL_TABLET | Freq: Every day | ORAL | 0 refills | Status: DC

## 2023-12-22 MED ORDER — NITROFURANTOIN MONOHYD MACRO 100 MG PO CAPS: 100.0000 mg | ORAL_CAPSULE | Freq: Two times a day (BID) | ORAL | 0 refills | Status: DC

## 2023-12-22 NOTE — Discharge Instructions (Signed)
 I have sent you an antibiotic for UTI and chlamydia.  Urine culture and vaginal swab pending.  Will call with the result.

## 2023-12-22 NOTE — ED Triage Notes (Signed)
 Pt c/o lower abd pressure and urinary frequency x2-3 days. Experienced some mild dysuria which resolved with AZO. Has not taken AZO today.  Pt also reports her partner told her today that he may have Chlamydia. Denies vaginal symptoms.

## 2023-12-22 NOTE — ED Provider Notes (Signed)
 [facility]    CSN: [id number] Arrival date & time: 12/22/23  1901      History   Chief Complaint Chief Complaint  Patient presents with   Abdominal Pain   Exposure to STD    HPI Sheila Davis is a 43 y.o. female.   Patient presents with dysuria, urinary frequency, urinary urgency that started about 2-3 days ago.  Reports that she works as a Engineer, civil (consulting) and is concerned about a urinary tract infection.  States that she does get frequent urinary tract infections due to her job.  Patient is also concerned given that her boyfriend just told her that he was recently unfaithful and the sexual partner he was unfaithful with has tested positive for chlamydia.  Patient denies any new vaginal discharge. Denies fever or back pain.   Abdominal Pain Exposure to STD    Past Medical History:  Diagnosis Date   Allergy    Anxiety    Depression    pcp   GERD (gastroesophageal reflux disease)    History of stomach ulcers    Hypertension    Migraines    Obesity (BMI 30-39.9)    Seasonal allergies     Patient Active Problem List   Diagnosis Date Noted   Boil of vulva 07/06/2023   Postpartum depression 06/03/2021   PMDD (premenstrual dysphoric disorder) 06/03/2021   H/O tubal ligation 06/03/2021   Palpitations 10/26/2019   Alpha thalassemia silent carrier 06/09/2019   History of 2 cesarean sections 05/31/2019   Abdominal hernia without obstruction and without gangrene 05/31/2019   Prediabetes 11/21/2017    Past Surgical History:  Procedure Laterality Date   CESAREAN SECTION      2002 & 2006   CESAREAN SECTION WITH BILATERAL TUBAL LIGATION Bilateral 10/23/2019   Procedure: CESAREAN SECTION WITH BILATERAL TUBAL LIGATION;  Surgeon: Adam Phenix, MD;  Location: [facility];  Service: Obstetrics;  Laterality: Bilateral;   COLONOSCOPY     UPPER GASTROINTESTINAL ENDOSCOPY      OB History     Gravida  5   Para  3   Term  3   Preterm      AB  2   Living  3       SAB  2   IAB  0   Ectopic      Multiple  0   Live Births  3            Home Medications    Prior to Admission medications   Medication Sig Start Date End Date Taking? Authorizing Provider  citalopram (CELEXA) 10 MG tablet SMARTSIG:1 Tablet(s) By Mouth Every Evening 04/10/22  Yes [provider]  doxycycline (VIBRAMYCIN) 100 MG capsule Take 1 capsule (100 mg total) by mouth 2 (two) times daily for 7 days. 12/22/23 12/29/23 Yes Lleyton Byers, Acie Fredrickson, FNP  fluconazole (DIFLUCAN) 150 MG tablet Take 1 tablet (150 mg total) by mouth daily. Take at first sign of vaginal yeast 12/22/23  Yes Cerina Leary, [place] E, FNP  nitrofurantoin, macrocrystal-monohydrate, (MACROBID) 100 MG capsule Take 1 capsule (100 mg total) by mouth 2 (two) times daily. 12/22/23  Yes Otila Starn, Acie Fredrickson, FNP  phenazopyridine (PYRIDIUM) 200 MG tablet Take 1 tablet (200 mg total) by mouth 3 (three) times daily. Only take for up to 2 days 12/22/23  Yes [place], [place] E, FNP  ALPRAZolam Prudy Feeler) 0.5 MG tablet Take 1 tablet (0.5 mg total) by mouth 2 (two) times daily as needed for anxiety. 02/24/20  Brock Bad, MD  cephALEXin (KEFLEX) 500 MG capsule Take 1 capsule (500 mg total) by mouth 3 (three) times daily. 07/06/23   Hermina Staggers, MD  cetirizine (ZYRTEC ALLERGY) 10 MG tablet Take 1 tablet (10 mg total) by mouth daily as needed for allergies. 03/05/23   Zenia Resides, MD  Probiotic Product (PROBIOTIC PO) Take by mouth.    [provider]    Family History Family History  Problem Relation Age of Onset   Cancer - Ovarian Mother 29   Heart disease Mother    Ovarian cancer Mother    Hypertension Father    COPD Father    Colon polyps Brother    Heart attack Maternal Grandmother    Lung cancer Maternal Grandfather    Diabetes Paternal Grandmother    Colon cancer Neg Hx    Stomach cancer Neg Hx    Esophageal cancer Neg Hx    Rectal cancer Neg Hx     Social History Social History   Tobacco Use    Smoking status: Former    Current packs/day: 0.00    Average packs/day: 0.2 packs/day for 0.5 years (0.1 ttl pk-yrs)    Types: Cigarettes    Start date: 03/04/2015    Quit date: 09/02/2015    Years since quitting: 8.3   Smokeless tobacco: Never   Tobacco comments:    Wasn't daily smoker, only socially or occasionally at the time, quit I 2016  Vaping Use   Vaping status: Never Used  Substance Use Topics   Alcohol use: Not Currently    Comment: rarely   Drug use: Never     Allergies   Penicillins   Review of Systems Review of Systems Per HPI  Physical Exam Triage Vital Signs ED Triage Vitals [12/22/23 1910]  Encounter Vitals Group     BP (!) 156/106     Systolic BP Percentile      Diastolic BP Percentile      Pulse Rate 92     Resp 20     Temp 97.9 F (36.6 C)     Temp Source Oral     SpO2 95 %     Weight      Height      Head Circumference      Peak Flow      Pain Score      Pain Loc      Pain Education      Exclude from Growth Chart    No data found.  Updated Vital Signs BP (!) 156/106 (BP Location: Right Arm)   Pulse 92   Temp 97.9 F (36.6 C) (Oral)   Resp 20   LMP 12/05/2023   SpO2 95%   Visual Acuity Right Eye Distance:   Left Eye Distance:   Bilateral Distance:    Right Eye Near:   Left Eye Near:    Bilateral Near:     Physical Exam Constitutional:      General: She is not in acute distress.    Appearance: Normal appearance. She is not toxic-appearing or diaphoretic.  HENT:     Head: Normocephalic and atraumatic.  Eyes:     Extraocular Movements: Extraocular movements intact.     Conjunctiva/sclera: Conjunctivae normal.  Pulmonary:     Effort: Pulmonary effort is normal.  Genitourinary:    Comments: Deferred with shared decision making. Self swab performed.  Neurological:     General: No focal deficit present.     Mental  Status: She is alert and oriented to person, place, and time. Mental status is at baseline.  Psychiatric:         Mood and Affect: Mood normal.        Behavior: Behavior normal.        Thought Content: Thought content normal.        Judgment: Judgment normal.      UC Treatments / Results  Labs (all labs ordered are listed, but only abnormal results are displayed) Labs Reviewed  POCT URINALYSIS DIP (MANUAL ENTRY) - Abnormal; Notable for the following components:      Result Value   Clarity, UA cloudy (*)    Blood, UA large (*)    Protein Ur, POC =30 (*)    Leukocytes, UA Small (1+) (*)    All other components within normal limits  URINE CULTURE  CERVICOVAGINAL ANCILLARY ONLY    EKG   Radiology No results found.  Procedures Procedures (including critical care time)  Medications Ordered in UC Medications - No data to display  Initial Impression / Assessment and Plan / UC Course  I have reviewed the triage vital signs and the nursing notes.  Pertinent labs & imaging results that were available during my care of the patient were reviewed by me and considered in my medical decision making (see chart for details).     UA indicating possible urinary tract infection.  Will send urine culture to confirm.  Patient also reporting possible exposure to chlamydia.  She wishes to be treated for chlamydia today.  Therefore, will treat with doxycycline while awaiting cervicovaginal swab.  Advised patient that it would be best to await vaginal swab results prior to treatment but she wishes to proceed with treatment today.  Given concern of urinary tract infection and leukocytes on UA, will treat for UTI with Macrobid as well.  Although, did discuss with patient that leukocytes and current symptoms could be due to chlamydia.  Patient wishes to proceed with treatment for UTI and chlamydia exposure.  Advised patient that dual antibiotics can cause adverse effects and she voiced understanding of this.  Advised adequate fluids and supportive care related to urinary tract infection symptoms.  Patient  requesting prescription for Pyridium.  Reports that she took a few pills OTC already so encouraged her to only use this for 2 days in total.  Patient also requesting Diflucan given antibiotics typically give her yeast infection so this was prescribed to take at first sign of vaginal yeast.  Advised strict follow up precautions.  Patient verbalized understanding and was agreeable with plan. Final Clinical Impressions(s) / UC Diagnoses   Final diagnoses:  Screening examination for venereal disease  Dysuria  Urinary frequency     Discharge Instructions      I have sent you an antibiotic for UTI and chlamydia.  Urine culture and vaginal swab pending.  Will call with the result.    ED Prescriptions     Medication Sig Dispense Auth. Provider   nitrofurantoin, macrocrystal-monohydrate, (MACROBID) 100 MG capsule Take 1 capsule (100 mg total) by mouth 2 (two) times daily. 10 capsule [place], [place] E, [place]   doxycycline (VIBRAMYCIN) 100 MG capsule Take 1 capsule (100 mg total) by mouth 2 (two) times daily for 7 days. 14 capsule [place], [place] E, [place]   fluconazole (DIFLUCAN) 150 MG tablet Take 1 tablet (150 mg total) by mouth daily. Take at first sign of vaginal yeast 1 tablet [place], Acie Fredrickson, [place]   phenazopyridine (PYRIDIUM)  200 MG tablet Take 1 tablet (200 mg total) by mouth 3 (three) times daily. Only take for up to 2 days 6 tablet Aten, Acie Fredrickson, [place]      PDMP not reviewed this encounter.   Gustavus Bryant, [place] 12/22/23 [phone]

## 2023-12-23 ENCOUNTER — Telehealth (HOSPITAL_COMMUNITY): Payer: Self-pay

## 2023-12-23 LAB — CERVICOVAGINAL ANCILLARY ONLY
Bacterial Vaginitis (gardnerella): POSITIVE — AB
Candida Glabrata: NEGATIVE
Candida Vaginitis: NEGATIVE
Chlamydia: POSITIVE — AB
Comment: NEGATIVE
Comment: NEGATIVE
Comment: NEGATIVE
Comment: NEGATIVE
Comment: NEGATIVE
Comment: NORMAL
Neisseria Gonorrhea: NEGATIVE
Trichomonas: NEGATIVE

## 2023-12-23 MED ORDER — METRONIDAZOLE 500 MG PO TABS: 500.0000 mg | ORAL_TABLET | Freq: Two times a day (BID) | ORAL | 0 refills | Status: AC

## 2023-12-23 NOTE — Telephone Encounter (Signed)
 Per protocol, pt requires tx with metronidazole. Reviewed with patient, verified pharmacy, prescription sent.

## 2023-12-24 ENCOUNTER — Telehealth (HOSPITAL_COMMUNITY): Payer: Self-pay

## 2023-12-24 LAB — URINE CULTURE: Culture: >=100000 — AB

## 2023-12-24 MED ORDER — SULFAMETHOXAZOLE-TRIMETHOPRIM 800-160 MG PO TABS: 1.0000 | ORAL_TABLET | Freq: Two times a day (BID) | ORAL | 0 refills | Status: AC

## 2023-12-24 NOTE — Telephone Encounter (Signed)
 Per protocol, pt to dc Macrobid and begin treatment with Bactrim.  Rx sent to pharmacy on file.

## 2024-01-06 ENCOUNTER — Ambulatory Visit: Payer: MEDICAID

## 2024-01-06 ENCOUNTER — Ambulatory Visit: Admitting: Obstetrics and Gynecology

## 2024-01-16 ENCOUNTER — Other Ambulatory Visit: Payer: Self-pay

## 2024-01-16 ENCOUNTER — Ambulatory Visit
Admission: EM | Admit: 2024-01-16 | Discharge: 2024-01-16 | Disposition: A | Discharge status: Home / Self Care | Payer: MEDICAID | Attending: Family Medicine | Admitting: Family Medicine

## 2024-01-16 DIAGNOSIS — Z202 Contact with and (suspected) exposure to infections with a predominantly sexual mode of transmission: Secondary | ICD-10-CM

## 2024-01-16 DIAGNOSIS — N3090 Cystitis, unspecified without hematuria: Secondary | ICD-10-CM

## 2024-01-16 LAB — POCT URINALYSIS DIP (MANUAL ENTRY)
Bilirubin, UA: NEGATIVE
Glucose, UA: NEGATIVE mg/dL
Ketones, POC UA: NEGATIVE mg/dL
Nitrite, UA: NEGATIVE
Protein Ur, POC: 30 mg/dL — AB
Spec Grav, UA: 1.02
Urobilinogen, UA: 0.2 U/dL
pH, UA: 7

## 2024-01-16 MED ORDER — PHENAZOPYRIDINE HCL 100 MG PO TABS
100.0000 mg | ORAL_TABLET | Freq: Three times a day (TID) | ORAL | 0 refills | Status: AC | PRN
Start: 2024-01-16 — End: ?

## 2024-01-16 MED ORDER — CIPROFLOXACIN HCL 500 MG PO TABS: 500.0000 mg | ORAL_TABLET | Freq: Two times a day (BID) | ORAL | 0 refills | Status: AC

## 2024-01-16 NOTE — ED Provider Notes (Addendum)
 [name]    CSN: [id number] Arrival date & time: 01/16/24  0947      History   Chief Complaint Chief Complaint  Patient presents with   Urinary Frequency    HPI Sheila Davis Davis is a 43 y.o. female.    Urinary Frequency  Here for pressure in her pelvis along with urinary frequency and dysuria.  Symptoms began about 2 or 3 days ago.  She has had a little bit of urinary incontinence and urgency also.  No fever or chills no nausea or vomiting.  She was treated for a UTI and was found to have chlamydia in mid March.  She was able to finish her doxycycline and Bactrim.  Penicillin  Past Medical History:  Diagnosis Date   Allergy    Anxiety    Depression    pcp   GERD (gastroesophageal reflux disease)    History of stomach ulcers    Hypertension    Migraines    Obesity (BMI 30-39.9)    Seasonal allergies     Patient Active Problem List   Diagnosis Date Noted   Boil of vulva 07/06/2023   Postpartum depression 06/03/2021   PMDD (premenstrual dysphoric disorder) 06/03/2021   H/O tubal ligation 06/03/2021   Palpitations 10/26/2019   Alpha thalassemia silent carrier 06/09/2019   History of 2 cesarean sections 05/31/2019   Abdominal hernia without obstruction and without gangrene 05/31/2019   Prediabetes 11/21/2017    Past Surgical History:  Procedure Laterality Date   CESAREAN SECTION      2002 & 2006   CESAREAN SECTION WITH BILATERAL TUBAL LIGATION Bilateral 10/23/2019   Procedure: CESAREAN SECTION WITH BILATERAL TUBAL LIGATION;  Surgeon: Tresia Fruit, MD;  Location: [facility];  Service: Obstetrics;  Laterality: Bilateral;   COLONOSCOPY     UPPER GASTROINTESTINAL ENDOSCOPY      OB History     Gravida  5   Para  3   Term  3   Preterm      AB  2   Living  3      SAB  2   IAB  0   Ectopic      Multiple  0   Live Births  3            Home Medications    Prior to Admission medications   Medication Sig Start Date End  Date Taking? Authorizing Provider  ciprofloxacin (CIPRO) 500 MG tablet Take 1 tablet (500 mg total) by mouth 2 (two) times daily for 7 days. 01/16/24 01/23/24 Yes Aileen Amore K, MD  phenazopyridine (PYRIDIUM) 100 MG tablet Take 1 tablet (100 mg total) by mouth 3 (three) times daily as needed (urinary pain). 01/16/24  Yes Czarina Gingras K, MD  ALPRAZolam (XANAX) 0.5 MG tablet Take 1 tablet (0.5 mg total) by mouth 2 (two) times daily as needed for anxiety. 02/24/20   Gabrielle Joiner, MD  cetirizine (ZYRTEC ALLERGY) 10 MG tablet Take 1 tablet (10 mg total) by mouth daily as needed for allergies. 03/05/23   Dakota Stangl K, MD  citalopram (CELEXA) 10 MG tablet SMARTSIG:1 Tablet(s) By Mouth Every Evening 04/10/22   [provider]  Probiotic Product (PROBIOTIC PO) Take by mouth.    [provider]    Family History Family History  Problem Relation Age of Onset   Cancer - Ovarian Mother 59   Heart disease Mother    Ovarian cancer Mother    Hypertension Father  COPD Father    Colon polyps Brother    Heart attack Maternal Grandmother    Lung cancer Maternal Grandfather    Diabetes Paternal Grandmother    Colon cancer Neg Hx    Stomach cancer Neg Hx    Esophageal cancer Neg Hx    Rectal cancer Neg Hx     Social History Social History   Tobacco Use   Smoking status: Former    Current packs/day: 0.00    Average packs/day: 0.2 packs/day for 0.5 years (0.1 ttl pk-yrs)    Types: Cigarettes    Start date: 03/04/2015    Quit date: 09/02/2015    Years since quitting: 8.3   Smokeless tobacco: Never   Tobacco comments:    Wasn't daily smoker, only socially or occasionally at the time, quit I 2016  Vaping Use   Vaping status: Never Used  Substance Use Topics   Alcohol use: Not Currently    Comment: rarely   Drug use: Never     Allergies   Penicillins   Review of Systems Review of Systems  Genitourinary:  Positive for frequency.     Physical Exam Triage  Vital Signs ED Triage Vitals  Encounter Vitals Group     BP 01/16/24 1016 (!) 144/91     Systolic BP Percentile --      Diastolic BP Percentile --      Pulse Rate 01/16/24 1016 89     Resp 01/16/24 1016 20     Temp 01/16/24 1016 97.6 F (36.4 C)     Temp Source 01/16/24 1016 Temporal     SpO2 01/16/24 1016 96 %     Weight 01/16/24 1020 285 lb (129.3 kg)     Height 01/16/24 1020 5\' 5"  (1.651 m)     Head Circumference --      Peak Flow --      Pain Score 01/16/24 1019 5     Pain Loc --      Pain Education --      Exclude from Growth Chart --    No data found.  Updated Vital Signs BP (!) 144/91 (BP Location: Right Arm)   Pulse 89   Temp 97.6 F (36.4 C) (Temporal)   Resp 20   Ht 5\' 5"  (1.651 m)   Wt 129.3 kg   LMP 01/03/2024 (Exact Date)   SpO2 96%   BMI 47.43 kg/m   Visual Acuity Right Eye Distance:   Left Eye Distance:   Bilateral Distance:    Right Eye Near:   Left Eye Near:    Bilateral Near:     Physical Exam Vitals reviewed.  Constitutional:      General: She is not in acute distress.    Appearance: She is not ill-appearing, toxic-appearing or diaphoretic.  Cardiovascular:     Rate and Rhythm: Normal rate and regular rhythm.  Pulmonary:     Effort: Pulmonary effort is normal.     Breath sounds: Normal breath sounds.  Abdominal:     Palpations: Abdomen is soft.     Tenderness: There is no abdominal tenderness.  Skin:    Coloration: Skin is not jaundiced or pale.  Neurological:     Mental Status: She is alert and oriented to person, place, and time.  Psychiatric:        Behavior: Behavior normal.      UC Treatments / Results  Labs (all labs ordered are listed, but only abnormal results are displayed) Labs Reviewed  POCT URINALYSIS DIP (MANUAL ENTRY) - Abnormal; Notable for the following components:      Result Value   Clarity, UA cloudy (*)    Blood, UA small (*)    Protein Ur, POC =30 (*)    Leukocytes, UA Small (1+) (*)    All other  components within normal limits  URINE CULTURE  HIV ANTIBODY (ROUTINE TESTING W REFLEX)  RPR  CERVICOVAGINAL ANCILLARY ONLY    EKG   Radiology No results found.  Procedures Procedures (including critical care time)  Medications Ordered in UC Medications - No data to display  Initial Impression / Assessment and Plan / UC Course  I have reviewed the triage vital signs and the nursing notes.  Pertinent labs & imaging results that were available during my care of the patient were reviewed by me and considered in my medical decision making (see chart for details).     Urine shows some white blood cells and a small amount of red blood cells.  Cipro sent in to treat probable cystitis.  Urine culture is sent and we will notify her if she needs a different antibiotic.  Blood is drawn to check HIV and RPR. Vaginal self swab is done, and we will notify of any positives on that or on the blood work, and treat per protocol.  Final Clinical Impressions(s) / UC Diagnoses   Final diagnoses:  Cystitis  Exposure to STD     Discharge Instructions      This showed some white blood cells and red blood cells, consistent with a bladder infection.  Take Cipro 500 mg--1 tablet 2 times daily for 7 days  Take Pyridium/phenazopyridine 100 mg--1 tablet 3 times daily as needed for urinary pain.  This medication usually makes the urine orange  Urine culture is sent, and staff will notify you that it looks like the antibiotic needs to be changed.  Staff will notify you if there is anything positive on the swab or on the blood work.      ED Prescriptions     Medication Sig Dispense Auth. Provider   ciprofloxacin (CIPRO) 500 MG tablet Take 1 tablet (500 mg total) by mouth 2 (two) times daily for 7 days. 14 tablet Herman Fiero K, MD   phenazopyridine (PYRIDIUM) 100 MG tablet Take 1 tablet (100 mg total) by mouth 3 (three) times daily as needed (urinary pain). 10 tablet Ellsworth Haas Lenorris Karger  K, MD      PDMP not reviewed this encounter.   Sheila Davis Keto, MD 01/16/24 1041    Sheila Davis Keto, MD 01/16/24 [phone]

## 2024-01-16 NOTE — ED Triage Notes (Signed)
 Pt presents with complaints of urinary frequency and pressure in lower abdomen x 2 days. Pt currently rates her overall pain a 5/10, burns when urinating. Pt is requesting STD panel today with vaginal swab + blood work.

## 2024-01-16 NOTE — Discharge Instructions (Signed)
 This showed some white blood cells and red blood cells, consistent with a bladder infection.  Take Cipro 500 mg--1 tablet 2 times daily for 7 days  Take Pyridium/phenazopyridine 100 mg--1 tablet 3 times daily as needed for urinary pain.  This medication usually makes the urine orange  Urine culture is sent, and staff will notify you that it looks like the antibiotic needs to be changed.  Staff will notify you if there is anything positive on the swab or on the blood work.

## 2024-01-17 LAB — RPR: RPR Ser Ql: NONREACTIVE

## 2024-01-19 ENCOUNTER — Telehealth (HOSPITAL_COMMUNITY): Payer: Self-pay

## 2024-01-19 LAB — URINE CULTURE: Culture: 20000 — AB

## 2024-01-19 LAB — HIV ANTIBODY (ROUTINE TESTING W REFLEX): HIV Screen 4th Generation wRfx: NONREACTIVE

## 2024-01-19 NOTE — Progress Notes (Deleted)
 ANNUAL EXAM Patient name: Sheila Davis MRN [id number]  Date of birth: [birth year] Chief Complaint:   No chief complaint on file.  History of Present Illness:   Sheila Davis is a 43 y.o. [phone] being seen today for a routine annual exam.  Current complaints: ***  Menstrual concerns? {yes/no:20286}  *** Breast or nipple changes? {yes/no:20286} *** Contraception use? {yes/no:20286} *** Sexually active? {yes/no:20286} ***  Patient's last menstrual period was 01/03/2024 (exact date).   The pregnancy intention screening data noted above was reviewed. Potential methods of contraception were discussed. The patient elected to proceed with No data recorded.   Last pap     Component Value Date/Time   DIAGPAP  02/20/2022 1327    - Negative for intraepithelial lesion or malignancy (NILM)   DIAGPAP  02/27/2020 1403    - Negative for intraepithelial lesion or malignancy (NILM)   DIAGPAP  11/19/2017 0000    NEGATIVE FOR INTRAEPITHELIAL LESIONS OR MALIGNANCY.   HPVHIGH Negative 02/20/2022 1327   HPVHIGH Negative 02/27/2020 1403   ADEQPAP  02/20/2022 1327    Satisfactory for evaluation; transformation zone component PRESENT.   ADEQPAP  02/27/2020 1403    Satisfactory for evaluation; transformation zone component PRESENT.   ADEQPAP  11/19/2017 0000    Satisfactory for evaluation  endocervical/transformation zone component PRESENT.     H/O abnormal pap: {yes/yes***/no:23866} Last mammogram: 11/2022 B[facility] 1.  Last colonoscopy: ***.      02/20/2022    1:21 PM 02/24/2020   10:08 AM 11/19/2017   10:33 AM 08/16/2015    8:45 AM 07/16/2015    8:31 AM  Depression screen PHQ 2/9  Decreased Interest 1 3 1  0 0  Down, Depressed, Hopeless 1 2 1  0 0  PHQ - 2 Score 2 5 2  0 0  Altered sleeping 1 2 2     Tired, decreased energy 0 1 2    Change in appetite 1 2 2     Feeling bad or failure about yourself  1 2 2     Trouble concentrating 1 2 2     Moving slowly or fidgety/restless 0 0 0    Suicidal  thoughts 0 1 1    PHQ-9 Score 6 15 13     Difficult doing work/chores   Somewhat difficult          02/20/2022    1:22 PM  GAD 7 : Generalized Anxiety Score  Nervous, Anxious, on Edge 1  Control/stop worrying 0  Worry too much - different things 2  Trouble relaxing 1  Restless 0  Easily annoyed or irritable 0  Afraid - awful might happen 0  Total GAD 7 Score 4     Review of Systems:   Pertinent items are noted in HPI Denies any headaches, blurred vision, fatigue, shortness of breath, chest pain, abdominal pain, abnormal vaginal discharge/itching/odor/irritation, problems with periods, bowel movements, urination, or intercourse unless otherwise stated above. Pertinent History Reviewed:  Reviewed past medical,surgical, social and family history.  Reviewed problem list, medications and allergies. Physical Assessment:  There were no vitals filed for this visit.There is no height or weight on file to calculate BMI.        Physical Examination:   General appearance - well appearing, and in no distress  Mental status - alert, oriented to person, place, and time  Psych:  She has a normal mood and affect  Skin - warm and dry, normal color, no suspicious lesions noted  Chest - effort normal, all lung fields clear to  auscultation bilaterally  Heart - normal rate and regular rhythm  Breasts - breasts appear normal, no suspicious masses, no skin or nipple changes or  axillary nodes  Abdomen - soft, nontender, nondistended, no masses or organomegaly  Pelvic -  VULVA: normal appearing vulva with no masses, tenderness or lesions   VAGINA: normal appearing vagina with normal color and discharge, no lesions   CERVIX: normal appearing cervix without discharge or lesions, no CMT  Thin prep pap is {Desc; done/not:10129} *** HR HPV cotesting  UTERUS: uterus is felt to be normal size, shape, consistency and nontender   ADNEXA: No adnexal masses or tenderness noted.  Extremities:  No swelling or  varicosities noted  Chaperone present for exam  No results found for this or any previous visit (from the past 24 hours).    Assessment & Plan:  There are no diagnoses linked to this encounter.      No orders of the defined types were placed in this encounter.   Meds: No orders of the defined types were placed in this encounter.   Follow-up: No follow-ups on file.  Kiki Pelton, MD 01/19/2024 4:35 PM

## 2024-01-19 NOTE — Telephone Encounter (Signed)
 Per S. Rodriguez-Southworth, PA-C, "She needs to go to ER, since will need IV antibiotics. These is nothing else orally we can send." TC to pt to advise of results and PA recs. Verbalized understanding.

## 2024-01-20 ENCOUNTER — Telehealth (HOSPITAL_COMMUNITY): Payer: Self-pay

## 2024-01-20 ENCOUNTER — Ambulatory Visit: Payer: PRIVATE HEALTH INSURANCE | Admitting: Obstetrics and Gynecology

## 2024-01-20 LAB — CERVICOVAGINAL ANCILLARY ONLY
Bacterial Vaginitis (gardnerella): NEGATIVE
Candida Glabrata: NEGATIVE
Candida Vaginitis: POSITIVE — AB
Chlamydia: NEGATIVE
Comment: NEGATIVE
Comment: NEGATIVE
Comment: NEGATIVE
Comment: NEGATIVE
Comment: NEGATIVE
Comment: NORMAL
Neisseria Gonorrhea: NEGATIVE
Trichomonas: NEGATIVE

## 2024-01-20 MED ORDER — FLUCONAZOLE 150 MG PO TABS
150.0000 mg | ORAL_TABLET | Freq: Once | ORAL | 0 refills | Status: AC
Start: 2024-01-20 — End: 2024-01-20

## 2024-01-20 NOTE — Telephone Encounter (Signed)
 Per protocol, pt requires tx with Diflucan.  Rx sent to pharmacy on file.

## 2024-02-16 ENCOUNTER — Other Ambulatory Visit (HOSPITAL_COMMUNITY)
Admission: RE | Admit: 2024-02-16 | Discharge: 2024-02-16 | Disposition: A | Discharge status: Home / Self Care | Payer: PRIVATE HEALTH INSURANCE | Source: Ambulatory Visit | Attending: Obstetrics and Gynecology | Admitting: Obstetrics and Gynecology

## 2024-02-16 ENCOUNTER — Encounter: Payer: Self-pay | Admitting: Obstetrics and Gynecology

## 2024-02-16 ENCOUNTER — Ambulatory Visit (INDEPENDENT_AMBULATORY_CARE_PROVIDER_SITE_OTHER): Payer: MEDICAID | Admitting: Obstetrics and Gynecology

## 2024-02-16 VITALS — BP 167/100 | HR 89 | Ht 65.5 in | Wt 285.3 lb

## 2024-02-16 DIAGNOSIS — Z113 Encounter for screening for infections with a predominantly sexual mode of transmission: Secondary | ICD-10-CM | POA: Diagnosis not present

## 2024-02-16 DIAGNOSIS — N939 Abnormal uterine and vaginal bleeding, unspecified: Secondary | ICD-10-CM | POA: Insufficient documentation

## 2024-02-16 DIAGNOSIS — Z124 Encounter for screening for malignant neoplasm of cervix: Secondary | ICD-10-CM

## 2024-02-16 DIAGNOSIS — Z01419 Encounter for gynecological examination (general) (routine) without abnormal findings: Secondary | ICD-10-CM | POA: Insufficient documentation

## 2024-02-16 DIAGNOSIS — R300 Dysuria: Secondary | ICD-10-CM | POA: Diagnosis not present

## 2024-02-16 LAB — POCT URINALYSIS DIPSTICK
Glucose, UA: NEGATIVE
Ketones, UA: NEGATIVE
Nitrite, UA: NEGATIVE
Protein, UA: NEGATIVE
Spec Grav, UA: 1.025 (ref 1.010–1.025)
Urobilinogen, UA: 1 U/dL
pH, UA: 6 (ref 5.0–8.0)

## 2024-02-16 NOTE — Progress Notes (Unsigned)
 ANNUAL EXAM Patient name: Sheila Davis MRN [id number]  Date of birth: 04/29/1981 Chief Complaint:   Gynecologic Exam  History of Present Illness:   Sheila Davis is a 43 y.o. [id number] being seen today for a routine annual exam.  Current complaints: annual  Discussed the use of AI scribe software for clinical note transcription with the patient, who gave verbal consent to proceed.  History of Present Illness Sheila Davis is a 43 year old female who presents for a Pap smear, mammogram, and evaluation of menstrual changes.  Menstrual periods have become longer and sometimes heavier since the birth of her third child, now lasting seven or more days. This change is bothersome due to both the duration and heaviness. She has not been on any medication for the bleeding in the past.  She is currently sexually active with a female partner and uses condoms for contraception and s/p TL. She has previously used birth control pills without any issues.  She has a history of preeclampsia and gestational diabetes during her last pregnancy, which occurred during the COVID pandemic. Since then, her blood pressure has been fluctuating, although she does not have a history of high blood pressure prior to that pregnancy.  She experiences migraines that are sometimes preceded by blurry vision and nausea, but not consistently with aura. No history of breast cancer, heart attack, or stroke.  No pain with intercourse, breast or nipple changes, and no history of blood clots in the lungs or legs.    No LMP recorded.   The pregnancy intention screening data noted above was reviewed. Potential methods of contraception were discussed. The patient elected to proceed with No data recorded.   Last pap     Component Value Date/Time   DIAGPAP  02/20/2022 1327    - Negative for intraepithelial lesion or malignancy (NILM)   DIAGPAP  02/27/2020 1403    - Negative for intraepithelial lesion or malignancy (NILM)    DIAGPAP  11/19/2017 0000    NEGATIVE FOR INTRAEPITHELIAL LESIONS OR MALIGNANCY.   HPVHIGH Negative 02/20/2022 1327   HPVHIGH Negative 02/27/2020 1403   ADEQPAP  02/20/2022 1327    Satisfactory for evaluation; transformation zone component PRESENT.   ADEQPAP  02/27/2020 1403    Satisfactory for evaluation; transformation zone component PRESENT.   ADEQPAP  11/19/2017 0000    Satisfactory for evaluation  endocervical/transformation zone component PRESENT.    Last mammogram: 11/2022 BIRADS 1.  Last colonoscopy: n/a.      02/20/2022    1:21 PM 02/24/2020   10:08 AM 11/19/2017   10:33 AM 08/16/2015    8:45 AM 07/16/2015    8:31 AM  Depression screen PHQ 2/9  Decreased Interest 1 3 1  0 0  Down, Depressed, Hopeless 1 2 1  0 0  PHQ - 2 Score 2 5 2  0 0  Altered sleeping 1 2 2     Tired, decreased energy 0 1 2    Change in appetite 1 2 2     Feeling bad or failure about yourself  1 2 2     Trouble concentrating 1 2 2     Moving slowly or fidgety/restless 0 0 0    Suicidal thoughts 0 1 1    PHQ-9 Score 6 15 13     Difficult doing work/chores   Somewhat difficult          02/20/2022    1:22 PM  GAD 7 : Generalized Anxiety Score  Nervous, Anxious, on Edge 1  Control/stop worrying 0  Worry too much - different things 2  Trouble relaxing 1  Restless 0  Easily annoyed or irritable 0  Afraid - awful might happen 0  Total GAD 7 Score 4     Review of Systems:   Pertinent items are noted in HPI Denies any headaches, blurred vision, fatigue, shortness of breath, chest pain, abdominal pain, abnormal vaginal discharge/itching/odor/irritation, problems with periods, bowel movements, urination, or intercourse unless otherwise stated above. Pertinent History Reviewed:  Reviewed past medical,surgical, social and family history.  Reviewed problem list, medications and allergies. Physical Assessment:   Vitals:   02/16/24 1506  BP: (!) 167/100  Pulse: 89  Weight: 285 lb 4.8 oz (129.4 kg)   Height: 5' 5.5" (1.664 m)  Body mass index is 46.75 kg/m.        Physical Examination:   General appearance - well appearing, and in no distress  Mental status - alert, oriented to person, place, and time  Psych:  She has a normal mood and affect  Skin - warm and dry, normal color, no suspicious lesions noted  Chest - effort normal, all lung fields clear to auscultation bilaterally  Heart - normal rate and regular rhythm  Breasts - breasts appear normal, no suspicious masses, no skin or nipple changes or  axillary nodes  Abdomen - soft, nontender, nondistended, no masses or organomegaly  Pelvic -  VULVA: normal appearing vulva with no masses, tenderness or lesions   VAGINA: normal appearing vagina with normal color, no lesions   CERVIX: incompletely visualized cervix without lesions, no CMT, discharge noted  Thin prep pap is done with HR HPV cotesting  UTERUS: uterus is felt to be normal size, shape, consistency and nontender   ADNEXA: No adnexal masses or tenderness noted.  Extremities:  No swelling or varicosities noted  Chaperone present for exam  No results found for this or any previous visit (from the past 24 hours).    Assessment & Plan:  Assessment and Plan Assessment & Plan Migraine without aura Migraines with blurry vision and nausea, no aura. Symptoms include pounding headache.  Follow-up Evaluation of menorrhagia and response to treatment. - Schedule follow-up after pelvic ultrasound and medication initiation. - Perform blood work today.  1. Well woman exam with routine gynecological exam (Primary) - Cytology - PAP( [place]) - CBC - Hemoglobin A1c - TSH Rfx on Abnormal to Free T4  2. Screening examination for STI - Cervicovaginal ancillary only - Hepatitis B surface antigen - Hepatitis C antibody - HIV Antibody (routine testing w rflx) - RPR  3. Screening for cervical cancer - Cytology - PAP( [place])  4. Abnormal uterine bleeding  (AUB) Periods lasting seven or more days, possibly due to uterine changes post-pregnancy. No recent pelvic ultrasound. - Prescribe progesterone-only pill to manage bleeding. - Order pelvic ultrasound to evaluate uterine structure. - Cytology - PAP( [place]) - CBC - Hemoglobin A1c - TSH Rfx on Abnormal to Free T4 - US  PELVIC COMPLETE WITH TRANSVAGINAL; Future  5. Dysuria - POCT Urinalysis Dipstick - Urine Culture    Orders Placed This Encounter  Procedures   Urine Culture   US  PELVIC COMPLETE WITH TRANSVAGINAL   Hepatitis B surface antigen   Hepatitis C antibody   HIV Antibody (routine testing w rflx)   RPR   CBC   Hemoglobin A1c   TSH Rfx on Abnormal to Free T4   POCT Urinalysis Dipstick    Meds: No orders of the defined types were placed in  this encounter.   Follow-up: Return in about 3 months (around 05/18/2024).  Kiki Pelton, MD 02/16/2024 3:36 PM

## 2024-02-16 NOTE — Progress Notes (Unsigned)
 Pt presents for annual exam. Reports urinary urgency, and pain/pressure in her lower back. Requests to be tested for possible UTI. Would like all STD testing.

## 2024-02-18 ENCOUNTER — Ambulatory Visit: Payer: Self-pay | Admitting: Obstetrics and Gynecology

## 2024-02-18 DIAGNOSIS — N3000 Acute cystitis without hematuria: Secondary | ICD-10-CM

## 2024-02-18 DIAGNOSIS — R300 Dysuria: Secondary | ICD-10-CM

## 2024-02-18 LAB — CERVICOVAGINAL ANCILLARY ONLY
Chlamydia: NEGATIVE
Comment: NEGATIVE
Comment: NEGATIVE
Comment: NORMAL
Neisseria Gonorrhea: NEGATIVE
Trichomonas: NEGATIVE

## 2024-02-18 LAB — CBC
Hematocrit: 37.5 % (ref 34.0–46.6)
Hemoglobin: 11.5 g/dL (ref 11.1–15.9)
MCH: 25.4 pg — ABNORMAL LOW (ref 26.6–33.0)
MCHC: 30.7 g/dL — ABNORMAL LOW (ref 31.5–35.7)
MCV: 83 fL (ref 79–97)
Platelets: 424 10*3/uL (ref 150–450)
RBC: 4.53 x10E6/uL (ref 3.77–5.28)
RDW: 14.1 % (ref 11.7–15.4)
WBC: 10.4 10*3/uL (ref 3.4–10.8)

## 2024-02-18 LAB — HIV ANTIBODY (ROUTINE TESTING W REFLEX): HIV Screen 4th Generation wRfx: NONREACTIVE

## 2024-02-18 LAB — HEPATITIS B SURFACE ANTIGEN: Hepatitis B Surface Ag: NEGATIVE

## 2024-02-18 LAB — CYTOLOGY - PAP
Adequacy: ABSENT
Comment: NEGATIVE
Diagnosis: NEGATIVE
Diagnosis: REACTIVE
High risk HPV: NEGATIVE

## 2024-02-18 LAB — TSH RFX ON ABNORMAL TO FREE T4: TSH: 0.454 u[IU]/mL (ref 0.450–4.500)

## 2024-02-18 LAB — HEMOGLOBIN A1C
Est. average glucose Bld gHb Est-mCnc: 123 mg/dL
Hgb A1c MFr Bld: 5.9 % — ABNORMAL HIGH (ref 4.8–5.6)

## 2024-02-18 LAB — RPR: RPR Ser Ql: NONREACTIVE

## 2024-02-18 LAB — HEPATITIS C ANTIBODY: Hep C Virus Ab: NONREACTIVE

## 2024-02-19 LAB — URINE CULTURE

## 2024-02-19 MED ORDER — CEFACLOR 250 MG PO CAPS: 250.0000 mg | ORAL_CAPSULE | Freq: Three times a day (TID) | ORAL | 0 refills | Status: AC

## 2024-02-25 ENCOUNTER — Ambulatory Visit (HOSPITAL_COMMUNITY): Admission: RE | Admit: 2024-02-25 | Payer: PRIVATE HEALTH INSURANCE | Source: Ambulatory Visit

## 2024-03-03 ENCOUNTER — Ambulatory Visit: Payer: PRIVATE HEALTH INSURANCE

## 2024-03-10 NOTE — Progress Notes (Signed)
 SUBJECTIVE    Sheila Davis is a 43 y.o. female seen for a chief complaint of the following: urinary urgency, frequency, and bladder pain.   I have reviewed all accompanying medical records.  Beginning in March, Sheila Davis first began experiencing pain and pressure and malodorous urine. Her urine was cultured and she was treated for a UTI. She was treated again in Sheila Davis and May for UTIs with temporary relief but subsequent return of pain and symptoms. She denies using fragrances or perfumed soaps. Today she reports pain and pressure in her bladder that radiates to her back, a sensation of urgency and frequency and new onset leakage beginning around 2 weeks ago. She denies fevers but is taking tylenol  and motrin  around the clock. She also reports the pain is worse post-coitus.   Her primary concerns are the bladder pain, frequency, and urgency and her goals for treatment are relief. 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  Urinary Symptoms: Voids: 2-3 times per day. Nocturia 2-3x She endorses occasional leaking. This has been going on for 2 weeks and is urge predominant. The leakage is painful in her bladder and back.  She leaks 1 time per day.  She leaks small volume. She uses 1 pantyliner per day.   In terms of treatment, she has tried azo, which she can't tell if it has helped.    She denies enuresis. She endorses pain or burning with urination. She endorses hematuria. Denies history of pyelonephritis or kidney stones. She has had 5 UTI's in the last year.   She drinks about 1 L of water in a day.    Past OB/GYN History: OB History   No obstetric history on file.    LMP: 02/26/24, still having regular menstruation Birth control: barrier protection and tubal ligation She is sexually active.  She denies dyspareunia. Last pap smear was 2025.    Past Medical History:   None.   Past Surgical History: Three cesarean sections.   Medications: She has a current medication list  which includes the following prescription(s): alprazolam , cetirizine , citalopram , tizanidine, and tolterodine. Famotidine  (20mg ), Probiotics, Loratadine, Fexofenadine HCl 180mg , Azo  Allergies: Patient is allergic to penicillins.   Family History: Mother - ovarian cancer  No family history of breast, colon, uterine, fallopian tube cancer.   OBJECTIVE    Physical Exam: BP (!) 150/120   Pulse 93   Temp 98.3 F (36.8 C) (Temporal)   Resp 18   SpO2 97%   Constitutional:  General appearance: Well nourished, well developed female in no acute distress.  Psych:  Normal mood and affect, alert and oriented x 3 Respiratory:  Normal respiratory effort.  Cardiovascular:  No lower extremity edema.  Skin:  Warm and dry, no lesions. Neuro:  CN II - XII grossly intact Urogynecologic Evaluation:    UA: moderate blood, trace protein, trace leukocytes.  PVR: 0 mL    ASSESSMENT and Plan   Sheila Davis is a 43 y.o. female seen in consultation for a chief complaint of the following:   1. Pelvic pain with a differential diagnosis of: UTI, musculoskeletal pain, uterine fibroid leading to irritation of the bladder.  2. Overactive bladder with urgency and frequency and occasional leakage.   Plan Culture urine sample to assess for UTI. Xanaflex for musculoskeletal pain.  Discussed management of OAB including 1st and 2nd line therapy; would like medication started; detrol sent  Electronically signed by: Tinnie Ismael Lowers, Medical Student 03/10/2024 11:08 AM  Sheila Davis [address] MD

## 2024-03-14 ENCOUNTER — Ambulatory Visit: Payer: PRIVATE HEALTH INSURANCE

## 2024-03-21 NOTE — Telephone Encounter (Addendum)
 Attempted to reach pt.  Left VM w/ result review per MD note.  Pt encouraged to contact office w/ any questions or concerns.  ----- Message from Caldwell Kluver, MD sent at 03/19/2024  8:24 PM EDT ----- Please inform patient her ultrasound does not show anything abnormal.  ----- Message ----- From: Delmar Herbert Winfred Booker Results In [place] Sent: 03/19/2024   2:32 PM EDT To: Amr Sherif Mohamed Rifaa Margart Sierras, MD

## 2024-05-07 ENCOUNTER — Encounter: Payer: Self-pay | Admitting: Emergency Medicine

## 2024-05-07 ENCOUNTER — Ambulatory Visit
Admission: EM | Admit: 2024-05-07 | Discharge: 2024-05-07 | Disposition: A | Discharge status: Home / Self Care | Attending: Family Medicine | Admitting: Family Medicine

## 2024-05-07 DIAGNOSIS — N898 Other specified noninflammatory disorders of vagina: Secondary | ICD-10-CM | POA: Insufficient documentation

## 2024-05-07 DIAGNOSIS — R350 Frequency of micturition: Secondary | ICD-10-CM | POA: Diagnosis not present

## 2024-05-07 DIAGNOSIS — R300 Dysuria: Secondary | ICD-10-CM | POA: Insufficient documentation

## 2024-05-07 DIAGNOSIS — R102 Pelvic and perineal pain: Secondary | ICD-10-CM | POA: Insufficient documentation

## 2024-05-07 DIAGNOSIS — R109 Unspecified abdominal pain: Secondary | ICD-10-CM | POA: Insufficient documentation

## 2024-05-07 DIAGNOSIS — Z202 Contact with and (suspected) exposure to infections with a predominantly sexual mode of transmission: Secondary | ICD-10-CM | POA: Insufficient documentation

## 2024-05-07 DIAGNOSIS — M549 Dorsalgia, unspecified: Secondary | ICD-10-CM | POA: Insufficient documentation

## 2024-05-07 LAB — POCT URINE DIPSTICK
Bilirubin, UA: NEGATIVE
Glucose, UA: NEGATIVE mg/dL
Ketones, POC UA: NEGATIVE mg/dL
Leukocytes, UA: NEGATIVE
Nitrite, UA: NEGATIVE
POC PROTEIN,UA: NEGATIVE
Spec Grav, UA: 1.02 (ref 1.010–1.025)
Urobilinogen, UA: 0.2 U/dL
pH, UA: 7 (ref 5.0–8.0)

## 2024-05-07 LAB — POCT URINE PREGNANCY: Preg Test, Ur: NEGATIVE

## 2024-05-07 MED ORDER — CEFUROXIME AXETIL 250 MG PO TABS: 500.0000 mg | ORAL_TABLET | Freq: Two times a day (BID) | ORAL | 0 refills | Status: AC

## 2024-05-07 MED ORDER — DOXYCYCLINE HYCLATE 100 MG PO TABS: 100.0000 mg | ORAL_TABLET | Freq: Two times a day (BID) | ORAL | 0 refills | Status: AC

## 2024-05-07 MED ORDER — CEFTRIAXONE SODIUM 500 MG IJ SOLR
500.0000 mg | INTRAMUSCULAR | Status: DC
  Administered 2024-05-07: 500 mg via INTRAMUSCULAR

## 2024-05-07 MED ORDER — FLUCONAZOLE 150 MG PO TABS: ORAL_TABLET | ORAL | 0 refills | Status: DC

## 2024-05-07 NOTE — ED Triage Notes (Signed)
 Pt c/o lower abdomin, back pain, vaginal discharge and some urinary frequency since beginning of week.   States she had unprotected sex. She would like std testing.

## 2024-05-07 NOTE — ED Provider Notes (Signed)
 [facility]    CSN: [id number] Arrival date & time: 05/07/24  9081      History   Chief Complaint Chief Complaint  Patient presents with   Back Pain   Urinary Frequency   Vaginal Discharge    HPI Sheila Davis is a 43 y.o. female.   Patient presents to clinic for consult suprapubic pressure, bilateral flank pain, vaginal discharge, urinary frequency and slight odor to vaginal discharge for the past 6 days.  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  Follows with OB/GYN and urology.  Struggles with pelvic pain, recent ultrasound done 2 months ago which shows no abnormalities.  Patient reports exposure to chlamydia around 1 month ago, unsure if her partner got treatment.  Recent sexual encounter with this partner 7 days ago, unprotected.  Shortly afterwards her symptoms started.  Has not had fevers or vomiting.  Some nausea.  PCN allergy of rash.   The history is provided by the patient and medical records.  Back Pain Urinary Frequency  Vaginal Discharge   Past Medical History:  Diagnosis Date   Allergy    Anxiety    Depression    pcp   GERD (gastroesophageal reflux disease)    History of stomach ulcers    Hypertension    Migraines    Obesity (BMI 30-39.9)    Seasonal allergies     Patient Active Problem List   Diagnosis Date Noted   Boil of vulva 07/06/2023   Postpartum depression 06/03/2021   PMDD (premenstrual dysphoric disorder) 06/03/2021   H/O tubal ligation 06/03/2021   Palpitations 10/26/2019   Alpha thalassemia silent carrier 06/09/2019   History of 2 cesarean sections 05/31/2019   Abdominal hernia without obstruction and without gangrene 05/31/2019   Prediabetes 11/21/2017    Past Surgical History:  Procedure Laterality Date   CESAREAN SECTION      2002 & 2006   CESAREAN SECTION WITH BILATERAL TUBAL LIGATION Bilateral 10/23/2019   Procedure: CESAREAN SECTION WITH BILATERAL TUBAL LIGATION;  Surgeon: Eveline Lynwood MATSU, MD;   Location: [facility] LD ORS;  Service: Obstetrics;  Laterality: Bilateral;   COLONOSCOPY     UPPER GASTROINTESTINAL ENDOSCOPY      OB History     Gravida  5   Para  3   Term  3   Preterm      AB  2   Living  3      SAB  2   IAB  0   Ectopic      Multiple  0   Live Births  3            Home Medications    Prior to Admission medications   Medication Sig Start Date End Date Taking? Authorizing Provider  cefUROXime  (CEFTIN ) 250 MG tablet Take 2 tablets (500 mg total) by mouth 2 (two) times daily with a meal for 5 days. 05/07/24 05/12/24 Yes Lillybeth Tal  N, FNP  doxycycline  (VIBRA -TABS) 100 MG tablet Take 1 tablet (100 mg total) by mouth 2 (two) times daily for 7 days. 05/07/24 05/14/24 Yes Wilhelm Ganaway  N, FNP  fluconazole  (DIFLUCAN ) 150 MG tablet Take 1 tablet today and another in 72 hours if vaginal itching persist. 05/07/24  Yes Dreama, Kaisen Ackers  N, FNP  ALPRAZolam  (XANAX ) 0.5 MG tablet Take 1 tablet (0.5 mg total) by mouth 2 (two) times daily as needed for anxiety. 02/24/20   Rudy Carlin LABOR, MD  cetirizine  (ZYRTEC  ALLERGY) 10 MG tablet Take 1 tablet (10 mg  total) by mouth daily as needed for allergies. 03/05/23   Vonna Sharlet POUR, MD  citalopram  (CELEXA ) 10 MG tablet SMARTSIG:1 Tablet(s) By Mouth Every Evening 04/10/22   [provider]  phenazopyridine  (PYRIDIUM ) 100 MG tablet Take 1 tablet (100 mg total) by mouth 3 (three) times daily as needed (urinary pain). Patient not taking: Reported on 02/16/2024 01/16/24   Banister, Pamela K, MD  Probiotic Product (PROBIOTIC PO) Take by mouth.    [provider]    Family History Family History  Problem Relation Age of Onset   Cancer - Ovarian Mother 41   Heart disease Mother    Ovarian cancer Mother    Hypertension Father    COPD Father    Colon polyps Brother    Heart attack Maternal Grandmother    Lung cancer Maternal Grandfather    Diabetes Paternal Grandmother    Colon cancer Neg Hx    Stomach  cancer Neg Hx    Esophageal cancer Neg Hx    Rectal cancer Neg Hx     Social History Social History   Tobacco Use   Smoking status: Former    Current packs/day: 0.00    Average packs/day: 0.2 packs/day for 0.5 years (0.1 ttl pk-yrs)    Types: Cigarettes    Start date: 03/04/2015    Quit date: 09/02/2015    Years since quitting: 8.6   Smokeless tobacco: Never   Tobacco comments:    Wasn't daily smoker, only socially or occasionally at the time, quit I 2016  Vaping Use   Vaping status: Never Used  Substance Use Topics   Alcohol use: Not Currently    Comment: rarely   Drug use: Never     Allergies   Penicillins   Review of Systems Review of Systems  Per HPI  Physical Exam Triage Vital Signs ED Triage Vitals  Encounter Vitals Group     BP 05/07/24 0928 (!) 159/112     Girls Systolic BP Percentile --      Girls Diastolic BP Percentile --      Boys Systolic BP Percentile --      Boys Diastolic BP Percentile --      Pulse Rate 05/07/24 0928 92     Resp 05/07/24 0928 16     Temp 05/07/24 0929 97.9 F (36.6 C)     Temp Source 05/07/24 0925 Oral     SpO2 05/07/24 0928 96 %     Weight --      Height --      Head Circumference --      Peak Flow --      Pain Score 05/07/24 0927 8     Pain Loc --      Pain Education --      Exclude from Growth Chart --    No data found.  Updated Vital Signs BP (!) 152/106 (BP Location: Right Arm)   Pulse 92   Temp 97.9 F (36.6 C) (Oral)   Resp 16   SpO2 96%   Visual Acuity Right Eye Distance:   Left Eye Distance:   Bilateral Distance:    Right Eye Near:   Left Eye Near:    Bilateral Near:     Physical Exam Vitals and nursing note reviewed.  Constitutional:      Appearance: Normal appearance.  HENT:     Head: Normocephalic and atraumatic.     Right Ear: External ear normal.     Left Ear: External ear  normal.     Nose: Nose normal.     Mouth/Throat:     Mouth: Mucous membranes are moist.  Eyes:      Conjunctiva/sclera: Conjunctivae normal.  Cardiovascular:     Rate and Rhythm: Normal rate.  Pulmonary:     Effort: Pulmonary effort is normal. No respiratory distress.  Abdominal:     Tenderness: There is left CVA tenderness. There is no right CVA tenderness.  Skin:    General: Skin is warm and dry.  Neurological:     General: No focal deficit present.     Mental Status: She is alert and oriented to person, place, and time.  Psychiatric:        Mood and Affect: Mood normal.        Behavior: Behavior normal.      UC Treatments / Results  Labs (all labs ordered are listed, but only abnormal results are displayed) Labs Reviewed  POCT URINE DIPSTICK - Abnormal; Notable for the following components:      Result Value   Blood, UA trace-intact (*)    All other components within normal limits  URINE CULTURE  POCT URINE PREGNANCY  CERVICOVAGINAL ANCILLARY ONLY    EKG   Radiology No results found.  Procedures Procedures (including critical care time)  Medications Ordered in UC Medications  cefTRIAXone  (ROCEPHIN ) injection 500 mg (500 mg Intramuscular Given 05/07/24 1000)    Initial Impression / Assessment and Plan / UC Course  I have reviewed the triage vital signs and the nursing notes.  Pertinent labs & imaging results that were available during my care of the patient were reviewed by me and considered in my medical decision making (see chart for details).  Vitals and triage reviewed, patient is hemodynamically stable.  Urinary symptoms, UA with red blood cells.  History of multidrug-resistant UTIs with CVA tenderness, recent fluoroquinolone use within the past 3 to 4 months, will treat with IM Rocephin  and Ceftin .  Staff will contact if treatment modification is needed based on urine culture results, decided to move forward with treatment prior to culture results to help prevent systemic infection.  Known exposure to chlamydia, empiric treatment with doxycycline .  Of  note, IM Rocephin  will also provide gonorrhea coverage.  Staff will contact if additional treatment is needed based on vaginal swab results.  Encouraged urology follow-up.  Plan of care, follow-up care return precautions given, no questions at this time.     Final Clinical Impressions(s) / UC Diagnoses   Final diagnoses:  Dysuria  Exposure to sexually transmitted infection  Vaginal discharge     Discharge Instructions      Antibiotics twice daily with food until finished.  Take a Diflucan  if vaginal itching develops, you can take another in 72 hours if the itching persists.  Ensure you are drinking at least 64 ounces of water daily.  We are sending your urine off for culture and we will contact you if we need to modify your antibiotics.  Would be best if you follow-up with your urology for ongoing symptoms.  Seek immediate care for any fever or vomiting.    ED Prescriptions     Medication Sig Dispense Auth. Provider   cefUROXime  (CEFTIN ) 250 MG tablet Take 2 tablets (500 mg total) by mouth 2 (two) times daily with a meal for 5 days. 20 tablet Dreama, Dinna Severs  N, FNP   doxycycline  (VIBRA -TABS) 100 MG tablet Take 1 tablet (100 mg total) by mouth 2 (two) times daily for 7  days. 14 tablet Dreama, Skyleigh Windle  N, FNP   fluconazole  (DIFLUCAN ) 150 MG tablet Take 1 tablet today and another in 72 hours if vaginal itching persist. 2 tablet Dreama Hans SAILOR, FNP      PDMP not reviewed this encounter.   Dreama Estelita SAILOR, FNP 05/07/24 1009

## 2024-05-07 NOTE — Discharge Instructions (Signed)
 Antibiotics twice daily with food until finished.  Take a Diflucan  if vaginal itching develops, you can take another in 72 hours if the itching persists.  Ensure you are drinking at least 64 ounces of water daily.  We are sending your urine off for culture and we will contact you if we need to modify your antibiotics.  Would be best if you follow-up with your urology for ongoing symptoms.  Seek immediate care for any fever or vomiting.

## 2024-05-08 LAB — URINE CULTURE: Culture: <10000 — AB

## 2024-05-09 ENCOUNTER — Ambulatory Visit (HOSPITAL_COMMUNITY): Payer: Self-pay

## 2024-05-09 LAB — CERVICOVAGINAL ANCILLARY ONLY
Bacterial Vaginitis (gardnerella): POSITIVE — AB
Candida Glabrata: NEGATIVE
Candida Vaginitis: NEGATIVE
Chlamydia: NEGATIVE
Comment: NEGATIVE
Comment: NEGATIVE
Comment: NEGATIVE
Comment: NEGATIVE
Comment: NEGATIVE
Comment: NORMAL
Neisseria Gonorrhea: NEGATIVE
Trichomonas: NEGATIVE

## 2024-05-09 MED ORDER — METRONIDAZOLE 500 MG PO TABS
500.0000 mg | ORAL_TABLET | Freq: Two times a day (BID) | ORAL | 0 refills | Status: AC
Start: 2024-05-09 — End: 2024-05-16

## 2024-09-04 ENCOUNTER — Encounter: Payer: Self-pay | Admitting: *Deleted

## 2024-09-04 ENCOUNTER — Ambulatory Visit: Admission: EM | Admit: 2024-09-04 | Discharge: 2024-09-04 | Disposition: A | Discharge status: Home / Self Care

## 2024-09-04 DIAGNOSIS — B3731 Acute candidiasis of vulva and vagina: Secondary | ICD-10-CM

## 2024-09-04 MED ORDER — TERCONAZOLE 0.8 % VA CREA: 1.0000 | TOPICAL_CREAM | Freq: Every day | VAGINAL | 0 refills | Status: AC

## 2024-09-04 MED ORDER — FLUCONAZOLE 150 MG PO TABS: ORAL_TABLET | ORAL | 1 refills | Status: AC

## 2024-09-04 NOTE — ED Provider Notes (Signed)
 [facility]    CSN: [id number] Arrival date & time: 09/04/24  0843      History   Chief Complaint Chief Complaint  Patient presents with   Vaginal Itching    HPI Sheila Davis is a 43 y.o. female.   Patient presents today due to 4 days worth of vaginal itching and erythematous and swollen vulva after finishing Keflex .  Patient states that she took OTC miconazole without significant relief.  Patient denies dysuria, frequency, urgency, lower abdominal pain, or back pain.  The history is provided by the patient.  Vaginal Itching    Past Medical History:  Diagnosis Date   Allergy    Anxiety    Depression    pcp   GERD (gastroesophageal reflux disease)    History of stomach ulcers    Hypertension    Migraines    Obesity (BMI 30-39.9)    Seasonal allergies     Patient Active Problem List   Diagnosis Date Noted   Boil of vulva 07/06/2023   Postpartum depression 06/03/2021   PMDD (premenstrual dysphoric disorder) 06/03/2021   H/O tubal ligation 06/03/2021   Palpitations 10/26/2019   Alpha thalassemia silent carrier 06/09/2019   History of 2 cesarean sections 05/31/2019   Abdominal hernia without obstruction and without gangrene 05/31/2019   Prediabetes 11/21/2017    Past Surgical History:  Procedure Laterality Date   CESAREAN SECTION      2002 & 2006   CESAREAN SECTION WITH BILATERAL TUBAL LIGATION Bilateral 10/23/2019   Procedure: CESAREAN SECTION WITH BILATERAL TUBAL LIGATION;  Surgeon: Eveline Lynwood MATSU, MD;  Location: [facility];  Service: Obstetrics;  Laterality: Bilateral;   COLONOSCOPY     UPPER GASTROINTESTINAL ENDOSCOPY      OB History     Gravida  5   Para  3   Term  3   Preterm      AB  2   Living  3      SAB  2   IAB  0   Ectopic      Multiple  0   Live Births  3            Home Medications    Prior to Admission medications   Medication Sig Start Date End Date Taking? Authorizing Provider  ALPRAZolam   (XANAX ) 0.5 MG tablet Take 1 tablet (0.5 mg total) by mouth 2 (two) times daily as needed for anxiety. 02/24/20  Yes Rudy Carlin LABOR, MD  cetirizine  (ZYRTEC  ALLERGY) 10 MG tablet Take 1 tablet (10 mg total) by mouth daily as needed for allergies. 03/05/23  Yes Vonna Sharlet POUR, MD  citalopram  (CELEXA ) 10 MG tablet SMARTSIG:1 Tablet(s) By Mouth Every Evening 04/10/22  Yes [provider]  fluconazole  (DIFLUCAN ) 150 MG tablet Take 1 tab po every 3 days 09/04/24  Yes Andra Corean BROCKS, PA-C  hydrOXYzine  (ATARAX ) 25 MG tablet Take 25 mg by mouth 3 (three) times daily as needed. 08/29/24  Yes [provider]  methocarbamol (ROBAXIN) 500 MG tablet Take 500-1,000 mg by mouth 2 (two) times daily. 07/12/24  Yes [provider]  terconazole  (TERAZOL 3 ) 0.8 % vaginal cream Place 1 applicator vaginally at bedtime. 09/04/24  Yes Andra Corean C, PA-C  phenazopyridine  (PYRIDIUM ) 100 MG tablet Take 1 tablet (100 mg total) by mouth 3 (three) times daily as needed (urinary pain). Patient not taking: Reported on 02/16/2024 01/16/24   Vonna Sharlet POUR, MD    Family History Family History  Problem  Relation Age of Onset   Cancer - Ovarian Mother 13   Heart disease Mother    Ovarian cancer Mother    Hypertension Father    COPD Father    Colon polyps Brother    Heart attack Maternal Grandmother    Lung cancer Maternal Grandfather    Diabetes Paternal Grandmother    Colon cancer Neg Hx    Stomach cancer Neg Hx    Esophageal cancer Neg Hx    Rectal cancer Neg Hx     Social History Social History   Tobacco Use   Smoking status: Former    Current packs/day: 0.00    Average packs/day: 0.2 packs/day for 0.5 years (0.1 ttl pk-yrs)    Types: Cigarettes    Start date: 03/04/2015    Quit date: 09/02/2015    Years since quitting: 9.0   Smokeless tobacco: Never   Tobacco comments:    Wasn't daily smoker, only socially or occasionally at the time, quit I 2016  Vaping Use    Vaping status: Never Used  Substance Use Topics   Alcohol use: Not Currently    Comment: rarely   Drug use: Never     Allergies   Penicillins   Review of Systems Review of Systems   Physical Exam Triage Vital Signs ED Triage Vitals [09/04/24 0858]  Encounter Vitals Group     BP (!) 125/93     Girls Systolic BP Percentile      Girls Diastolic BP Percentile      Boys Systolic BP Percentile      Boys Diastolic BP Percentile      Pulse Rate 93     Resp 16     Temp 98 F (36.7 C)     Temp Source Oral     SpO2 95 %     Weight      Height      Head Circumference      Peak Flow      Pain Score      Pain Loc      Pain Education      Exclude from Growth Chart    No data found.  Updated Vital Signs BP (!) 125/93 (BP Location: Left Arm)   Pulse 93   Temp 98 F (36.7 C) (Oral)   Resp 16   LMP 08/13/2024   SpO2 95%   Visual Acuity Right Eye Distance:   Left Eye Distance:   Bilateral Distance:    Right Eye Near:   Left Eye Near:    Bilateral Near:     Physical Exam Vitals and nursing note reviewed.  Constitutional:      General: She is not in acute distress.    Appearance: Normal appearance. She is not ill-appearing, toxic-appearing or diaphoretic.  Eyes:     General: No scleral icterus. Cardiovascular:     Rate and Rhythm: Normal rate and regular rhythm.     Heart sounds: Normal heart sounds.  Pulmonary:     Effort: Pulmonary effort is normal. No respiratory distress.     Breath sounds: Normal breath sounds. No wheezing or rhonchi.  Abdominal:     General: Abdomen is flat. Bowel sounds are normal.     Palpations: Abdomen is soft.     Tenderness: There is no abdominal tenderness. There is no right CVA tenderness or left CVA tenderness.  Skin:    General: Skin is warm.  Neurological:     Mental Status: She is alert and  oriented to person, place, and time.  Psychiatric:        Mood and Affect: Mood normal.        Behavior: Behavior normal.       UC Treatments / Results  Labs (all labs ordered are listed, but only abnormal results are displayed) Labs Reviewed - No data to display  EKG   Radiology No results found.  Procedures Procedures (including critical care time)  Medications Ordered in UC Medications - No data to display  Initial Impression / Assessment and Plan / UC Course  I have reviewed the triage vital signs and the nursing notes.  Pertinent labs & imaging results that were available during my care of the patient were reviewed by me and considered in my medical decision making (see chart for details).    Final Clinical Impressions(s) / UC Diagnoses   Final diagnoses:  Vaginal candida   Discharge Instructions   None    ED Prescriptions     Medication Sig Dispense Auth. Provider   terconazole  (TERAZOL 3 ) 0.8 % vaginal cream Place 1 applicator vaginally at bedtime. 20 g Fatimah Sundquist C, PA-C   fluconazole  (DIFLUCAN ) 150 MG tablet Take 1 tab po every 3 days 2 tablet Andra Corean BROCKS, PA-C      PDMP not reviewed this encounter.   Andra Corean BROCKS, PA-C 09/04/24 [phone]

## 2024-09-04 NOTE — ED Triage Notes (Signed)
 Pt reports vaginal itching, irritation, and swelling x 4 days. She has tried miconazole cream without relief. She just finished a course of keflex . Denies urinary symptoms.

## 2024-10-28 ENCOUNTER — Ambulatory Visit: Admission: EM | Admit: 2024-10-28 | Discharge: 2024-10-28 | Disposition: A | Discharge status: Home / Self Care

## 2024-10-28 ENCOUNTER — Encounter: Payer: Self-pay | Admitting: Emergency Medicine

## 2024-10-28 DIAGNOSIS — H109 Unspecified conjunctivitis: Secondary | ICD-10-CM

## 2024-10-28 DIAGNOSIS — B9689 Other specified bacterial agents as the cause of diseases classified elsewhere: Secondary | ICD-10-CM | POA: Diagnosis not present

## 2024-10-28 MED ORDER — CIPROFLOXACIN HCL 0.3 % OP SOLN: 1.0000 [drp] | OPHTHALMIC | 0 refills | Status: AC

## 2024-10-28 NOTE — ED Triage Notes (Signed)
 Pt reports R eye pain, irritation, and drainage x2 days. Reports itching and redness to outer corner of eye. Excessive crusting in the mornings with drainage throughout the day. Notes dull, aching pain and irritation to area. Denies vision changes. Has used cleareyes drops with no relief. Cool compresses with no relief.

## 2024-10-28 NOTE — ED Provider Notes (Signed)
 " [facility]    CSN: [id number] Arrival date & time: 10/28/24  1903      History   Chief Complaint Chief Complaint  Patient presents with   Eye Pain    HPI Sheila Davis is a 44 y.o. female.   Patient presents today due to red right eye with purulent drainage in the morning for the past 2 days that is starting to spread to the left eye.  Patient denies changes in vision, or contact lens use.  The history is provided by the patient.  Eye Pain    Past Medical History:  Diagnosis Date   Allergy    Anxiety    Depression    pcp   GERD (gastroesophageal reflux disease)    History of stomach ulcers    Hypertension    Migraines    Obesity (BMI 30-39.9)    Seasonal allergies     Patient Active Problem List   Diagnosis Date Noted   Boil of vulva 07/06/2023   Left knee pain 06/23/2022   Postpartum depression 06/03/2021   PMDD (premenstrual dysphoric disorder) 06/03/2021   H/O tubal ligation 06/03/2021   Palpitations 10/26/2019   Alpha thalassemia silent carrier 06/09/2019   History of 2 cesarean sections 05/31/2019   Abdominal hernia without obstruction and without gangrene 05/31/2019   Prediabetes 11/21/2017   Acute sinusitis 11/16/2017    Past Surgical History:  Procedure Laterality Date   CESAREAN SECTION      2002 & 2006   CESAREAN SECTION WITH BILATERAL TUBAL LIGATION Bilateral 10/23/2019   Procedure: CESAREAN SECTION WITH BILATERAL TUBAL LIGATION;  Surgeon: Eveline Lynwood MATSU, MD;  Location: [facility];  Service: Obstetrics;  Laterality: Bilateral;   COLONOSCOPY     UPPER GASTROINTESTINAL ENDOSCOPY      OB History     Gravida  5   Para  3   Term  3   Preterm      AB  2   Living  3      SAB  2   IAB  0   Ectopic      Multiple  0   Live Births  3            Home Medications    Prior to Admission medications  Medication Sig Start Date End Date Taking? Authorizing Provider  ALPRAZolam  (XANAX ) 0.5 MG tablet Take 1  tablet (0.5 mg total) by mouth 2 (two) times daily as needed for anxiety. 02/24/20  Yes Rudy Carlin LABOR, MD  cetirizine  (ZYRTEC  ALLERGY) 10 MG tablet Take 1 tablet (10 mg total) by mouth daily as needed for allergies. 03/05/23  Yes Vonna Sharlet POUR, MD  ciprofloxacin  (CILOXAN ) 0.3 % ophthalmic solution Place 1 drop into both eyes every 2 (two) hours. Administer 1 drop, every 2 hours, while awake, for 2 days. Then 1 drop, every 4 hours, while awake, for the next 5 days. 10/28/24  Yes Andra Corean BROCKS, PA-C  citalopram  (CELEXA ) 20 MG tablet Take 20 mg by mouth at bedtime.   Yes [provider]  cefdinir (OMNICEF) 300 MG capsule Oral Patient not taking: Reported on 10/28/2024 11/16/17   [provider]  citalopram  (CELEXA ) 10 MG tablet SMARTSIG:1 Tablet(s) By Mouth Every Evening Patient not taking: Reported on 10/28/2024 04/10/22   [provider]  fluconazole  (DIFLUCAN ) 150 MG tablet Take 1 tab po every 3 days Patient not taking: Reported on 10/28/2024 09/04/24   Andra Corean C, PA-C  hydrOXYzine  (ATARAX ) 25  MG tablet Take 25 mg by mouth 3 (three) times daily as needed. 08/29/24   [provider]  lidocaine (XYLOCAINE) 2 % solution APPLY 5 ML TOPICALLY 4 TIMES DAILY BEFORE MEAL(S) AND AT BEDTIME Patient not taking: Reported on 10/28/2024    [provider]  meloxicam (MOBIC) 15 MG tablet Take 1 tablet by mouth daily. Patient not taking: Reported on 10/28/2024    [provider]  methocarbamol (ROBAXIN) 500 MG tablet Take 500-1,000 mg by mouth 2 (two) times daily. Patient not taking: Reported on 10/28/2024 07/12/24   [provider]  pantoprazole (PROTONIX) 20 MG tablet Take 1 tablet by mouth daily. Patient not taking: Reported on 10/28/2024    [provider]  phenazopyridine  (PYRIDIUM ) 100 MG tablet Take 1 tablet (100 mg total) by mouth 3 (three) times daily as needed (urinary pain). Patient not taking: Reported on 02/16/2024  01/16/24   Vonna Sharlet POUR, MD  predniSONE  (DELTASONE ) 10 MG tablet Oral Patient not taking: Reported on 10/28/2024 11/16/17   [provider]  sulindac (CLINORIL) 200 MG tablet Take 200 mg by mouth 2 (two) times daily as needed. Patient not taking: Reported on 10/28/2024 07/12/24   [provider]  terconazole  (TERAZOL 3 ) 0.8 % vaginal cream Place 1 applicator vaginally at bedtime. Patient not taking: Reported on 10/28/2024 09/04/24   Andra Krabbe C, PA-C  tiZANidine (ZANAFLEX) 4 MG tablet Take 4 mg by mouth. Patient not taking: Reported on 10/28/2024 03/10/24   [provider]  tolterodine (DETROL LA) 2 MG 24 hr capsule Take 4 mg by mouth. Patient not taking: Reported on 10/28/2024 03/10/24   [provider]    Family History Family History  Problem Relation Age of Onset   Cancer - Ovarian Mother 69   Heart disease Mother    Ovarian cancer Mother    Hypertension Father    COPD Father    Colon polyps Brother    Heart attack Maternal Grandmother    Lung cancer Maternal Grandfather    Diabetes Paternal Grandmother    Colon cancer Neg Hx    Stomach cancer Neg Hx    Esophageal cancer Neg Hx    Rectal cancer Neg Hx     Social History Social History[1]   Allergies   Penicillins   Review of Systems Review of Systems  Eyes:  Positive for pain.     Physical Exam Triage Vital Signs ED Triage Vitals  Encounter Vitals Group     BP 10/28/24 1925 (!) 145/96     Girls Systolic BP Percentile --      Girls Diastolic BP Percentile --      Boys Systolic BP Percentile --      Boys Diastolic BP Percentile --      Pulse Rate 10/28/24 1925 82     Resp 10/28/24 1925 14     Temp 10/28/24 1925 98.7 F (37.1 C)     Temp Source 10/28/24 1925 Oral     SpO2 10/28/24 1925 95 %     Weight --      Height --      Head Circumference --      Peak Flow --      Pain Score 10/28/24 1922 5     Pain Loc --      Pain Education --      Exclude from Growth  Chart --    No data found.  Updated Vital Signs BP (!) 145/96 (BP Location: Left Arm)  Pulse 82   Temp 98.7 F (37.1 C) (Oral)   Resp 14   LMP 10/05/2024 (Approximate)   SpO2 95%   Visual Acuity Right Eye Distance:   Left Eye Distance:   Bilateral Distance:    Right Eye Near:   Left Eye Near:    Bilateral Near:     Physical Exam Vitals and nursing note reviewed.  Constitutional:      General: She is not in acute distress.    Appearance: Normal appearance. She is not ill-appearing, toxic-appearing or diaphoretic.  Eyes:     General: No scleral icterus.       Right eye: No discharge.        Left eye: No discharge.     Extraocular Movements: Extraocular movements intact.     Pupils: Pupils are equal, round, and reactive to light.     Comments: Mildly erythematous palpebral conjunctiva of right eye  Cardiovascular:     Rate and Rhythm: Normal rate and regular rhythm.     Heart sounds: Normal heart sounds.  Pulmonary:     Effort: Pulmonary effort is normal. No respiratory distress.     Breath sounds: Normal breath sounds. No wheezing or rhonchi.  Skin:    General: Skin is warm.  Neurological:     Mental Status: She is alert and oriented to person, place, and time.  Psychiatric:        Mood and Affect: Mood normal.        Behavior: Behavior normal.      UC Treatments / Results  Labs (all labs ordered are listed, but only abnormal results are displayed) Labs Reviewed - No data to display  EKG   Radiology No results found.  Procedures Procedures (including critical care time)  Medications Ordered in UC Medications - No data to display  Initial Impression / Assessment and Plan / UC Course  I have reviewed the triage vital signs and the nursing notes.  Pertinent labs & imaging results that were available during my care of the patient were reviewed by me and considered in my medical decision making (see chart for details).      Final Clinical  Impressions(s) / UC Diagnoses   Final diagnoses:  Bacterial conjunctivitis of both eyes     Discharge Instructions      You have been diagnosed with bacterial conjunctivitis or pinkeye today.  You are contagious until you take eyedrops for 24 hours.  Practice good hand hygiene, use soap and water.  Be sure not to share towels and washcloths, change them daily.  Symptoms should improve in 3 days, but use eyedrops for 7.  If you experience changes in vision you need to follow-up with ophthalmologist as soon as possible.     ED Prescriptions     Medication Sig Dispense Auth. Provider   ciprofloxacin  (CILOXAN ) 0.3 % ophthalmic solution Place 1 drop into both eyes every 2 (two) hours. Administer 1 drop, every 2 hours, while awake, for 2 days. Then 1 drop, every 4 hours, while awake, for the next 5 days. 5 mL Andra Corean BROCKS, PA-C      PDMP not reviewed this encounter.    [1]  Social History Tobacco Use   Smoking status: Former    Current packs/day: 0.00    Average packs/day: 0.2 packs/day for 0.5 years (0.1 ttl pk-yrs)    Types: Cigarettes    Start date: 03/04/2015    Quit date: 09/02/2015    Years since quitting: 9.1  Smokeless tobacco: Never   Tobacco comments:    Wasn't daily smoker, only socially or occasionally at the time, quit I 2016  Vaping Use   Vaping status: Never Used  Substance Use Topics   Alcohol use: Not Currently    Comment: rarely   Drug use: Never     Andra Corean BROCKS, PA-C 10/28/24 1945  "

## 2024-10-28 NOTE — Discharge Instructions (Signed)
 You have been diagnosed with bacterial conjunctivitis or pinkeye today.  You are contagious until you take eyedrops for 24 hours.  Practice good hand hygiene, use soap and water .  Be sure not to share towels and washcloths, change them daily.  Symptoms should improve in 3 days, but use eyedrops for 7.  If you experience changes in vision you need to follow-up with ophthalmologist as soon as possible.
